# Patient Record
Sex: Female | Born: 1965
Health system: Southern US, Community
[De-identification: ages and names within clinical notes are randomized; demographics above are authoritative.]

## PROBLEM LIST (undated history)

## (undated) DIAGNOSIS — Z9889 Other specified postprocedural states: Secondary | ICD-10-CM

## (undated) DIAGNOSIS — C50919 Malignant neoplasm of unspecified site of unspecified female breast: Secondary | ICD-10-CM

## (undated) DIAGNOSIS — C801 Malignant (primary) neoplasm, unspecified: Secondary | ICD-10-CM

## (undated) DIAGNOSIS — R112 Nausea with vomiting, unspecified: Secondary | ICD-10-CM

## (undated) DIAGNOSIS — Z90722 Acquired absence of ovaries, bilateral: Secondary | ICD-10-CM

## (undated) DIAGNOSIS — D509 Iron deficiency anemia, unspecified: Secondary | ICD-10-CM

## (undated) HISTORY — DX: Acquired absence of ovaries, bilateral: Z90.722

## (undated) HISTORY — DX: Iron deficiency anemia, unspecified: D50.9

## (undated) HISTORY — DX: Malignant neoplasm of unspecified site of unspecified female breast: C50.919

---

## 1999-03-14 ENCOUNTER — Encounter: Payer: Self-pay | Admitting: Obstetrics and Gynecology

## 1999-03-14 ENCOUNTER — Encounter: Admission: RE | Admit: 1999-03-14 | Discharge: 1999-03-14 | Payer: Self-pay | Admitting: Obstetrics and Gynecology

## 1999-05-16 ENCOUNTER — Other Ambulatory Visit: Admission: RE | Admit: 1999-05-16 | Discharge: 1999-05-16 | Payer: Self-pay | Admitting: Obstetrics and Gynecology

## 1999-11-02 ENCOUNTER — Inpatient Hospital Stay (HOSPITAL_COMMUNITY): Admission: AD | Admit: 1999-11-02 | Discharge: 1999-11-02 | Payer: Self-pay | Admitting: Obstetrics and Gynecology

## 1999-11-23 ENCOUNTER — Ambulatory Visit (HOSPITAL_COMMUNITY): Admission: RE | Admit: 1999-11-23 | Discharge: 1999-11-23 | Payer: Self-pay | Admitting: Obstetrics and Gynecology

## 1999-11-23 ENCOUNTER — Encounter: Payer: Self-pay | Admitting: Obstetrics and Gynecology

## 2000-08-07 ENCOUNTER — Other Ambulatory Visit: Admission: RE | Admit: 2000-08-07 | Discharge: 2000-08-07 | Payer: Self-pay | Admitting: Obstetrics and Gynecology

## 2002-02-04 ENCOUNTER — Other Ambulatory Visit: Admission: RE | Admit: 2002-02-04 | Discharge: 2002-02-04 | Payer: Self-pay | Admitting: Obstetrics and Gynecology

## 2002-04-30 HISTORY — PX: DIAGNOSTIC LAPAROSCOPY: SUR761

## 2002-07-24 ENCOUNTER — Ambulatory Visit (HOSPITAL_COMMUNITY): Admission: RE | Admit: 2002-07-24 | Discharge: 2002-07-24 | Payer: Self-pay | Admitting: Obstetrics and Gynecology

## 2002-07-24 ENCOUNTER — Encounter (INDEPENDENT_AMBULATORY_CARE_PROVIDER_SITE_OTHER): Payer: Self-pay

## 2003-05-01 HISTORY — PX: CHOLECYSTECTOMY: SHX55

## 2003-05-21 ENCOUNTER — Other Ambulatory Visit: Admission: RE | Admit: 2003-05-21 | Discharge: 2003-05-21 | Payer: Self-pay | Admitting: Obstetrics and Gynecology

## 2003-08-08 ENCOUNTER — Emergency Department (HOSPITAL_COMMUNITY): Admission: EM | Admit: 2003-08-08 | Discharge: 2003-08-08 | Payer: Self-pay | Admitting: Emergency Medicine

## 2003-11-08 ENCOUNTER — Emergency Department (HOSPITAL_COMMUNITY): Admission: EM | Admit: 2003-11-08 | Discharge: 2003-11-08 | Payer: Self-pay | Admitting: Emergency Medicine

## 2003-12-23 ENCOUNTER — Ambulatory Visit (HOSPITAL_COMMUNITY): Admission: RE | Admit: 2003-12-23 | Discharge: 2003-12-23 | Payer: Self-pay | Admitting: General Surgery

## 2003-12-29 ENCOUNTER — Ambulatory Visit (HOSPITAL_COMMUNITY): Admission: RE | Admit: 2003-12-29 | Discharge: 2003-12-29 | Payer: Self-pay | Admitting: *Deleted

## 2004-02-07 ENCOUNTER — Other Ambulatory Visit: Admission: RE | Admit: 2004-02-07 | Discharge: 2004-02-07 | Payer: Self-pay | Admitting: Obstetrics and Gynecology

## 2004-03-07 ENCOUNTER — Ambulatory Visit (HOSPITAL_COMMUNITY): Admission: RE | Admit: 2004-03-07 | Discharge: 2004-03-07 | Payer: Self-pay | Admitting: Family Medicine

## 2004-03-29 ENCOUNTER — Ambulatory Visit: Payer: Self-pay | Admitting: Internal Medicine

## 2004-03-30 ENCOUNTER — Ambulatory Visit: Payer: Self-pay | Admitting: Internal Medicine

## 2004-04-30 HISTORY — PX: LAPAROSCOPY: SHX197

## 2004-05-18 ENCOUNTER — Ambulatory Visit: Payer: Self-pay | Admitting: Internal Medicine

## 2004-05-21 ENCOUNTER — Emergency Department (HOSPITAL_COMMUNITY): Admission: EM | Admit: 2004-05-21 | Discharge: 2004-05-22 | Payer: Self-pay | Admitting: Emergency Medicine

## 2004-05-24 ENCOUNTER — Ambulatory Visit: Payer: Self-pay | Admitting: Internal Medicine

## 2004-05-24 ENCOUNTER — Ambulatory Visit (HOSPITAL_COMMUNITY): Admission: RE | Admit: 2004-05-24 | Discharge: 2004-05-24 | Payer: Self-pay | Admitting: Internal Medicine

## 2004-05-25 ENCOUNTER — Ambulatory Visit (HOSPITAL_COMMUNITY): Admission: RE | Admit: 2004-05-25 | Discharge: 2004-05-25 | Payer: Self-pay | Admitting: Internal Medicine

## 2004-06-01 ENCOUNTER — Ambulatory Visit (HOSPITAL_COMMUNITY): Admission: RE | Admit: 2004-06-01 | Discharge: 2004-06-01 | Payer: Self-pay | Admitting: Internal Medicine

## 2004-10-19 ENCOUNTER — Ambulatory Visit: Admission: RE | Admit: 2004-10-19 | Discharge: 2004-10-19 | Payer: Self-pay | Admitting: Gynecology

## 2005-04-18 ENCOUNTER — Other Ambulatory Visit: Admission: RE | Admit: 2005-04-18 | Discharge: 2005-04-18 | Payer: Self-pay | Admitting: Obstetrics and Gynecology

## 2006-03-24 ENCOUNTER — Emergency Department (HOSPITAL_COMMUNITY): Admission: EM | Admit: 2006-03-24 | Discharge: 2006-03-24 | Payer: Self-pay | Admitting: Emergency Medicine

## 2007-09-05 ENCOUNTER — Ambulatory Visit (HOSPITAL_COMMUNITY): Payer: Self-pay | Admitting: Oncology

## 2007-11-10 ENCOUNTER — Ambulatory Visit (HOSPITAL_COMMUNITY): Payer: Self-pay | Admitting: Oncology

## 2008-04-30 HISTORY — PX: OTHER SURGICAL HISTORY: SHX169

## 2008-04-30 HISTORY — PX: ABDOMINAL HYSTERECTOMY: SHX81

## 2008-04-30 HISTORY — PX: BILATERAL OOPHORECTOMY: SHX1221

## 2008-09-23 ENCOUNTER — Encounter: Admission: RE | Admit: 2008-09-23 | Discharge: 2008-09-23 | Payer: Self-pay | Admitting: Obstetrics and Gynecology

## 2008-09-28 ENCOUNTER — Encounter: Admission: RE | Admit: 2008-09-28 | Discharge: 2008-09-28 | Payer: Self-pay | Admitting: Obstetrics and Gynecology

## 2008-09-28 ENCOUNTER — Encounter (INDEPENDENT_AMBULATORY_CARE_PROVIDER_SITE_OTHER): Payer: Self-pay | Admitting: Radiology

## 2008-09-28 DIAGNOSIS — C50919 Malignant neoplasm of unspecified site of unspecified female breast: Secondary | ICD-10-CM

## 2008-09-28 HISTORY — DX: Malignant neoplasm of unspecified site of unspecified female breast: C50.919

## 2008-10-05 ENCOUNTER — Encounter: Admission: RE | Admit: 2008-10-05 | Discharge: 2008-10-05 | Payer: Self-pay | Admitting: Obstetrics and Gynecology

## 2008-10-07 ENCOUNTER — Encounter (INDEPENDENT_AMBULATORY_CARE_PROVIDER_SITE_OTHER): Payer: Self-pay | Admitting: Obstetrics and Gynecology

## 2008-10-07 ENCOUNTER — Encounter: Admission: RE | Admit: 2008-10-07 | Discharge: 2008-10-07 | Payer: Self-pay | Admitting: Obstetrics and Gynecology

## 2008-10-07 ENCOUNTER — Encounter (INDEPENDENT_AMBULATORY_CARE_PROVIDER_SITE_OTHER): Payer: Self-pay | Admitting: Diagnostic Radiology

## 2008-10-21 ENCOUNTER — Encounter: Admission: RE | Admit: 2008-10-21 | Discharge: 2008-10-21 | Payer: Self-pay | Admitting: General Surgery

## 2008-10-22 ENCOUNTER — Encounter (INDEPENDENT_AMBULATORY_CARE_PROVIDER_SITE_OTHER): Payer: Self-pay | Admitting: General Surgery

## 2008-10-22 ENCOUNTER — Encounter: Admission: RE | Admit: 2008-10-22 | Discharge: 2008-10-22 | Payer: Self-pay | Admitting: General Surgery

## 2008-10-22 ENCOUNTER — Ambulatory Visit (HOSPITAL_BASED_OUTPATIENT_CLINIC_OR_DEPARTMENT_OTHER): Admission: RE | Admit: 2008-10-22 | Discharge: 2008-10-22 | Payer: Self-pay | Admitting: General Surgery

## 2008-11-25 ENCOUNTER — Ambulatory Visit: Admission: RE | Admit: 2008-11-25 | Discharge: 2009-01-24 | Payer: Self-pay | Admitting: Radiation Oncology

## 2008-12-03 ENCOUNTER — Ambulatory Visit (HOSPITAL_COMMUNITY): Payer: Self-pay | Admitting: Oncology

## 2008-12-03 ENCOUNTER — Encounter (HOSPITAL_COMMUNITY): Admission: RE | Admit: 2008-12-03 | Discharge: 2009-01-02 | Payer: Self-pay | Admitting: Oncology

## 2008-12-14 ENCOUNTER — Encounter (INDEPENDENT_AMBULATORY_CARE_PROVIDER_SITE_OTHER): Payer: Self-pay | Admitting: Obstetrics and Gynecology

## 2008-12-14 ENCOUNTER — Inpatient Hospital Stay (HOSPITAL_COMMUNITY): Admission: RE | Admit: 2008-12-14 | Discharge: 2008-12-16 | Payer: Self-pay | Admitting: Obstetrics and Gynecology

## 2009-02-25 ENCOUNTER — Encounter (HOSPITAL_COMMUNITY): Admission: RE | Admit: 2009-02-25 | Discharge: 2009-03-27 | Payer: Self-pay | Admitting: Oncology

## 2009-02-25 ENCOUNTER — Ambulatory Visit (HOSPITAL_COMMUNITY): Payer: Self-pay | Admitting: Oncology

## 2009-04-07 ENCOUNTER — Ambulatory Visit (HOSPITAL_COMMUNITY): Admission: RE | Admit: 2009-04-07 | Discharge: 2009-04-07 | Payer: Self-pay | Admitting: Family Medicine

## 2009-05-03 ENCOUNTER — Encounter (HOSPITAL_COMMUNITY): Admission: RE | Admit: 2009-05-03 | Discharge: 2009-06-02 | Payer: Self-pay | Admitting: Oncology

## 2009-05-03 ENCOUNTER — Ambulatory Visit (HOSPITAL_COMMUNITY): Payer: Self-pay | Admitting: Oncology

## 2009-06-30 ENCOUNTER — Ambulatory Visit (HOSPITAL_COMMUNITY): Payer: Self-pay | Admitting: Oncology

## 2009-06-30 ENCOUNTER — Encounter (HOSPITAL_COMMUNITY): Admission: RE | Admit: 2009-06-30 | Discharge: 2009-07-30 | Payer: Self-pay | Admitting: Oncology

## 2009-09-07 ENCOUNTER — Ambulatory Visit (HOSPITAL_COMMUNITY): Admission: RE | Admit: 2009-09-07 | Discharge: 2009-09-07 | Payer: Self-pay | Admitting: Oncology

## 2010-01-03 ENCOUNTER — Encounter (HOSPITAL_COMMUNITY): Admission: RE | Admit: 2010-01-03 | Discharge: 2010-01-27 | Payer: Self-pay | Admitting: Oncology

## 2010-01-03 ENCOUNTER — Ambulatory Visit (HOSPITAL_COMMUNITY): Payer: Self-pay | Admitting: Oncology

## 2010-02-13 ENCOUNTER — Emergency Department (HOSPITAL_COMMUNITY): Admission: EM | Admit: 2010-02-13 | Discharge: 2010-02-13 | Payer: Self-pay | Admitting: Emergency Medicine

## 2010-05-21 ENCOUNTER — Encounter: Payer: Self-pay | Admitting: Family Medicine

## 2010-07-05 ENCOUNTER — Ambulatory Visit (HOSPITAL_COMMUNITY): Payer: BC Managed Care – PPO | Admitting: Oncology

## 2010-07-05 ENCOUNTER — Encounter (HOSPITAL_COMMUNITY): Payer: BC Managed Care – PPO | Attending: Oncology

## 2010-07-05 DIAGNOSIS — D509 Iron deficiency anemia, unspecified: Secondary | ICD-10-CM | POA: Insufficient documentation

## 2010-07-05 DIAGNOSIS — Z09 Encounter for follow-up examination after completed treatment for conditions other than malignant neoplasm: Secondary | ICD-10-CM | POA: Insufficient documentation

## 2010-07-05 DIAGNOSIS — R209 Unspecified disturbances of skin sensation: Secondary | ICD-10-CM | POA: Insufficient documentation

## 2010-07-05 DIAGNOSIS — Z853 Personal history of malignant neoplasm of breast: Secondary | ICD-10-CM | POA: Insufficient documentation

## 2010-07-05 DIAGNOSIS — Z923 Personal history of irradiation: Secondary | ICD-10-CM | POA: Insufficient documentation

## 2010-07-05 DIAGNOSIS — C50919 Malignant neoplasm of unspecified site of unspecified female breast: Secondary | ICD-10-CM

## 2010-07-12 LAB — BASIC METABOLIC PANEL
BUN: 12 mg/dL (ref 6–23)
Chloride: 106 mEq/L (ref 96–112)
GFR calc non Af Amer: >60 mL/min (ref >60)
Glucose, Bld: 83 mg/dL (ref 70–99)
Potassium: 3.7 mEq/L (ref 3.5–5.1)
Sodium: 141 mEq/L (ref 135–145)

## 2010-07-12 LAB — DIFFERENTIAL
Basophils Relative: 1 % (ref 0–1)
Eosinophils Relative: 2 % (ref 0–5)
Lymphocytes Relative: 31 % (ref 12–46)
Monocytes Absolute: 0.3 10*3/uL (ref 0.1–1.0)
Monocytes Relative: 7 % (ref 3–12)
Neutro Abs: 2.8 10*3/uL (ref 1.7–7.7)

## 2010-07-12 LAB — CBC
HCT: 33.9 % — ABNORMAL LOW (ref 36.0–46.0)
Hemoglobin: 11.3 g/dL — ABNORMAL LOW (ref 12.0–15.0)
MCHC: 33.4 g/dL (ref 30.0–36.0)
MCV: 86 fL (ref 78.0–100.0)

## 2010-07-12 LAB — POCT CARDIAC MARKERS
CKMB, poc: <1 ng/mL — ABNORMAL LOW (ref 1.0–8.0)
Troponin i, poc: <0.05 ng/mL (ref 0.00–0.09)

## 2010-07-13 LAB — CBC
HCT: 37.3 % (ref 36.0–46.0)
Hemoglobin: 12 g/dL (ref 12.0–15.0)
MCH: 28.2 pg (ref 26.0–34.0)
MCHC: 32.1 g/dL (ref 30.0–36.0)
MCV: 87.7 fL (ref 78.0–100.0)

## 2010-07-16 LAB — CBC
HCT: 33.2 % — ABNORMAL LOW (ref 36.0–46.0)
MCV: 83.4 fL (ref 78.0–100.0)
Platelets: 211 10*3/uL (ref 150–400)
RDW: 16 % — ABNORMAL HIGH (ref 11.5–15.5)

## 2010-07-23 LAB — FERRITIN: Ferritin: 18 ng/mL (ref 10–291)

## 2010-07-23 LAB — CBC
MCHC: 33.9 g/dL (ref 30.0–36.0)
MCV: 86 fL (ref 78.0–100.0)
RDW: 13.3 % (ref 11.5–15.5)

## 2010-08-03 LAB — CBC
MCV: 80.8 fL (ref 78.0–100.0)
Platelets: 221 10*3/uL (ref 150–400)
RDW: 16.2 % — ABNORMAL HIGH (ref 11.5–15.5)
WBC: 5 10*3/uL (ref 4.0–10.5)

## 2010-08-03 LAB — FERRITIN: Ferritin: 10 ng/mL (ref 10–291)

## 2010-08-05 LAB — CBC
HCT: 23.1 % — ABNORMAL LOW (ref 36.0–46.0)
HCT: 23.9 % — ABNORMAL LOW (ref 36.0–46.0)
HCT: 31.3 % — ABNORMAL LOW (ref 36.0–46.0)
HCT: 28.9 % — ABNORMAL LOW (ref 36.0–46.0)
Hemoglobin: 7.7 g/dL — CL (ref 12.0–15.0)
Hemoglobin: 8 g/dL — ABNORMAL LOW (ref 12.0–15.0)
Hemoglobin: 9.6 g/dL — ABNORMAL LOW (ref 12.0–15.0)
MCHC: 33.1 g/dL (ref 30.0–36.0)
MCV: 82.4 fL (ref 78.0–100.0)
MCV: 82.4 fL (ref 78.0–100.0)
MCV: 82.6 fL (ref 78.0–100.0)
Platelets: 207 10*3/uL (ref 150–400)
Platelets: 210 10*3/uL (ref 150–400)
Platelets: 280 10*3/uL (ref 150–400)
Platelets: 276 10*3/uL (ref 150–400)
RBC: 2.9 MIL/uL — ABNORMAL LOW (ref 3.87–5.11)
RDW: 15.7 % — ABNORMAL HIGH (ref 11.5–15.5)
RDW: 15.9 % — ABNORMAL HIGH (ref 11.5–15.5)
RDW: 15.5 % (ref 11.5–15.5)
WBC: 10.9 10*3/uL — ABNORMAL HIGH (ref 4.0–10.5)
WBC: 6.1 10*3/uL (ref 4.0–10.5)

## 2010-08-05 LAB — BASIC METABOLIC PANEL
BUN: 4 mg/dL — ABNORMAL LOW (ref 6–23)
Chloride: 108 mEq/L (ref 96–112)
GFR calc Af Amer: >60 mL/min (ref >60)
GFR calc non Af Amer: >60 mL/min (ref >60)
Potassium: 3.9 mEq/L (ref 3.5–5.1)
Sodium: 137 mEq/L (ref 135–145)

## 2010-08-05 LAB — URINALYSIS, ROUTINE W REFLEX MICROSCOPIC
Hgb urine dipstick: NEGATIVE
Ketones, ur: NEGATIVE mg/dL
Protein, ur: NEGATIVE mg/dL
Urobilinogen, UA: 0.2 mg/dL (ref 0.0–1.0)

## 2010-08-05 LAB — COMPREHENSIVE METABOLIC PANEL
AST: 21 U/L (ref 0–37)
Albumin: 4 g/dL (ref 3.5–5.2)
BUN: 9 mg/dL (ref 6–23)
Calcium: 8.9 mg/dL (ref 8.4–10.5)
Chloride: 100 mEq/L (ref 96–112)
Creatinine, Ser: 0.6 mg/dL (ref 0.4–1.2)
GFR calc Af Amer: >60 mL/min (ref >60)
Total Bilirubin: 0.5 mg/dL (ref 0.3–1.2)
Total Protein: 7.7 g/dL (ref 6.0–8.3)

## 2010-08-05 LAB — APTT: aPTT: 28 seconds (ref 24–37)

## 2010-08-05 LAB — PROTIME-INR: INR: 1 (ref 0.00–1.49)

## 2010-08-07 LAB — CBC
MCHC: 32.5 g/dL (ref 30.0–36.0)
MCV: 81.3 fL (ref 78.0–100.0)
Platelets: 256 10*3/uL (ref 150–400)
WBC: 5.2 10*3/uL (ref 4.0–10.5)

## 2010-08-07 LAB — COMPREHENSIVE METABOLIC PANEL
ALT: 14 U/L (ref 0–35)
AST: 20 U/L (ref 0–37)
Albumin: 3.5 g/dL (ref 3.5–5.2)
Calcium: 8.9 mg/dL (ref 8.4–10.5)
Creatinine, Ser: 0.72 mg/dL (ref 0.4–1.2)
GFR calc Af Amer: >60 mL/min (ref >60)
Sodium: 141 mEq/L (ref 135–145)

## 2010-08-07 LAB — DIFFERENTIAL
Eosinophils Relative: 2 % (ref 0–5)
Lymphocytes Relative: 28 % (ref 12–46)
Lymphs Abs: 1.4 10*3/uL (ref 0.7–4.0)
Monocytes Absolute: 0.3 10*3/uL (ref 0.1–1.0)
Monocytes Relative: 5 % (ref 3–12)

## 2010-08-28 ENCOUNTER — Encounter (INDEPENDENT_AMBULATORY_CARE_PROVIDER_SITE_OTHER): Payer: Self-pay | Admitting: General Surgery

## 2010-09-05 ENCOUNTER — Other Ambulatory Visit (HOSPITAL_COMMUNITY): Payer: Self-pay | Admitting: Oncology

## 2010-09-05 ENCOUNTER — Encounter (HOSPITAL_COMMUNITY): Payer: BC Managed Care – PPO | Attending: Oncology | Admitting: Oncology

## 2010-09-05 DIAGNOSIS — Z853 Personal history of malignant neoplasm of breast: Secondary | ICD-10-CM | POA: Insufficient documentation

## 2010-09-05 DIAGNOSIS — C50919 Malignant neoplasm of unspecified site of unspecified female breast: Secondary | ICD-10-CM

## 2010-09-05 DIAGNOSIS — R209 Unspecified disturbances of skin sensation: Secondary | ICD-10-CM | POA: Insufficient documentation

## 2010-09-05 DIAGNOSIS — Z923 Personal history of irradiation: Secondary | ICD-10-CM | POA: Insufficient documentation

## 2010-09-05 DIAGNOSIS — D509 Iron deficiency anemia, unspecified: Secondary | ICD-10-CM | POA: Insufficient documentation

## 2010-09-05 DIAGNOSIS — Z09 Encounter for follow-up examination after completed treatment for conditions other than malignant neoplasm: Secondary | ICD-10-CM | POA: Insufficient documentation

## 2010-09-05 DIAGNOSIS — R1907 Generalized intra-abdominal and pelvic swelling, mass and lump: Secondary | ICD-10-CM

## 2010-09-05 LAB — BASIC METABOLIC PANEL
BUN: 13 mg/dL (ref 6–23)
CO2: 31 mEq/L (ref 19–32)
Chloride: 102 mEq/L (ref 96–112)
Creatinine, Ser: 0.61 mg/dL (ref 0.4–1.2)
Glucose, Bld: 86 mg/dL (ref 70–99)

## 2010-09-07 ENCOUNTER — Ambulatory Visit (HOSPITAL_COMMUNITY)
Admission: RE | Admit: 2010-09-07 | Discharge: 2010-09-07 | Disposition: A | Discharge status: Home / Self Care | Payer: BC Managed Care – PPO | Source: Ambulatory Visit | Attending: Oncology | Admitting: Oncology

## 2010-09-07 ENCOUNTER — Encounter (HOSPITAL_COMMUNITY): Payer: Self-pay

## 2010-09-07 ENCOUNTER — Other Ambulatory Visit (HOSPITAL_COMMUNITY): Payer: Self-pay | Admitting: Oncology

## 2010-09-07 DIAGNOSIS — R1012 Left upper quadrant pain: Secondary | ICD-10-CM | POA: Insufficient documentation

## 2010-09-07 DIAGNOSIS — C50919 Malignant neoplasm of unspecified site of unspecified female breast: Secondary | ICD-10-CM | POA: Insufficient documentation

## 2010-09-07 DIAGNOSIS — R1907 Generalized intra-abdominal and pelvic swelling, mass and lump: Secondary | ICD-10-CM

## 2010-09-07 DIAGNOSIS — K7689 Other specified diseases of liver: Secondary | ICD-10-CM | POA: Insufficient documentation

## 2010-09-07 HISTORY — DX: Malignant (primary) neoplasm, unspecified: C80.1

## 2010-09-07 MED ORDER — IOHEXOL 300 MG/ML  SOLN
100.0000 mL | Freq: Once | INTRAMUSCULAR | Status: AC | PRN
  Administered 2010-09-07: 100 mL via INTRAVENOUS

## 2010-09-12 ENCOUNTER — Other Ambulatory Visit: Payer: Self-pay | Admitting: General Surgery

## 2010-09-12 DIAGNOSIS — Z9889 Other specified postprocedural states: Secondary | ICD-10-CM

## 2010-09-12 NOTE — Op Note (Signed)
NAME:  Veronica Dudley, Veronica Dudley               ACCOUNT NO.:  [id number]   MEDICAL RECORD NO.:  [id number]          PATIENT TYPE:  AMB   LOCATION:  DSC                          FACILITY:  [facility]   PHYSICIAN:  [name]. [name], M.D. DATE OF BIRTH:  [birth year]   DATE OF PROCEDURE:  10/22/2008  DATE OF DISCHARGE:  10/22/2008                               OPERATIVE REPORT   PREOPERATIVE DIAGNOSIS:  Left breast cancer.   POSTOPERATIVE DIAGNOSIS:  Left breast cancer.   PROCEDURE:  Left breast wire localized lumpectomy and sentinel node  biopsy x2 with injection of blue dye.   SURGEON:  [name]. [name], MD   ANESTHESIA:  General via LMA.   PROCEDURE:  After informed consent was obtained, the patient was brought  to the operating room, placed in supine position on the operating table.  After adequate induction of general anesthesia, the patient's left chest  and axilla were prepped with Betadine and draped in usual sterile  manner.  Earlier in the day, the patient had undergone injection of 1  mCi of technetium sulfur colloid in the subareolar position.  The  patient also underwent a wire localization earlier in the day.  The  patient had 2 wires entering the left breast in the upper inner quadrant  medially and heading laterally.  At this point, 2 mL of methylene blue  and 3 mL of injectable saline were also injected in the subareolar  position.  The breast was massaged for several minutes.  Using the  NeoProbe, a hot spot in the left axilla was identified.  A small  transverse incision was made overlying the hot spot with a 15 blade  knife.  This incision was carried down through the skin and subcutaneous  tissue sharply with the electrocautery until the axilla was entered.  A  Weitlaner retractor was deployed using the NeoProbe to direct the  dissection.  The hot lymph nodes were identified using a combination of  blunt hemostat dissection and some sharp dissection with the  electrocautery.   Once the lymph nodes were identified, the lymphatics  were clamped with hemostats, divided, and ligated with 3-0 Vicryl ties.  There were 2 sentinel nodes both for hot and blue.  Ex vivo counts on  the first were about 1200, ex vivo counts on the second were about 300.  Both were sent as sentinel nodes.  Touch preps on these were negative.  No other hot lymph nodes, palpable lymph nodes, or blue lymph nodes were  identified.  The deep layer of the wound was then closed with  interrupted 3-0 Vicryl stitches and the skin was closed with a running 4-  0 Monocryl subcuticular stitch.  Attention was then turned to the left  breast.  A curvilinear transverse incision was made overlying the path  of the wires in the upper inner quadrant of the left breast with a 15  blade knife.  This incision was carried down through the skin and  subcutaneous tissue sharply with the electrocautery.  Once into the  breast tissue, the path of the wires could be  palpated.  A circular  portion of breast tissue was excised sharply from this quadrant of the  breast.  Superficially, the breast tissue was separated at the junction  between subcutaneous fat and breast tissue and then this dissection was  carried deep down to the chest wall and the specimen was taken with the  pectoralis fascia.  Once it was completely removed, it was oriented with  the pain kit according to the assigned colors.  Specimen radiograph was  obtained that showed the clip and wires and calcifications to be well  within the specimen.  It was then sent to pathology for further  evaluation.  Hemostasis was achieved using the Bovie electrocautery.  The wound was infiltrated with 0.25% Marcaine.  The deep layer of the  wound was closed with interrupted 3-0 Vicryl stitches and  the skin was closed with the running 4-0 Monocryl subcuticular stitch.  Dermabond dressings were applied.  The patient tolerated the procedure  well.  At the end of the  case, all needle, sponge, and instrument counts  were correct.  The patient was then awakened and taken to recovery room  in stable condition.      [name]. [name], M.D.  Electronically Signed     PST/MEDQ  D:  10/25/2008  T:  10/25/2008  Job:  [id number]

## 2010-09-12 NOTE — Op Note (Signed)
NAME:  Veronica Dudley, Veronica Dudley               ACCOUNT NO.:  [id number]   MEDICAL RECORD NO.:  [id number]          PATIENT TYPE:  INP   LOCATION:  9306                          FACILITY:  [facility]   PHYSICIAN:  [name], M.D.   DATE OF BIRTH:  [birth year]   DATE OF PROCEDURE:  DATE OF DISCHARGE:                               OPERATIVE REPORT   PREOPERATIVE DIAGNOSES:  1. Complex left adnexal mass.  2. Thickened endometrium.  3. History of endometriosis.  4. History of uterine fibroids.  5. Left breast carcinoma in situ, estrogen receptor positive.   POSTOPERATIVE DIAGNOSES:  1. Complex left adnexal mass.  2. Thickened endometrium.  3. Endometriosis.  4. History of uterine fibroids.  5. Left breast carcinoma in situ, estrogen receptor positive.   PROCEDURE:  A diagnostic laparoscopy, conversion to laparotomy with  supracervical hysterectomy, left salpingo-oophorectomy, lysis of  adhesions.   SURGEON:  [name], MD   ASSISTANT:  Miguel Aschoff, MD   ANESTHESIA:  General endotracheal.   IV FLUIDS:  2800 mL Ringer's lactate.   ESTIMATED BLOOD LOSS:  300 mL   URINE OUTPUT:  200 mL   COMPLICATIONS:  None.   INDICATIONS FOR PROCEDURE:  The patient is a 45 year old gravida 2, para  2 African American female who has a long history of endometriosis,  menorrhagia and anemia and a history of known uterine fibroids, who has  been followed for dysmenorrhea and bleeding problems.  The patient has  had an ultrasound documenting a complex left adnexal mass suspicious for  an endometrioma and she has not been compliant with followup.  The  patient has intermittently come to the office requesting treatment of  her pain and has been treated with Depo-Provera and oral contraceptive  pills in the past.  When the patient recently represented for care, an  ultrasound was performed which documented an endometrial stripe of 21.4  mm with a thickened area consistent with a possible fibroid or  polyp.  At that time, the complex left adnexal mass measuring 3.29 cm was again  confirmed.  The cyst had no increased blood flow.  The patient did not  have a right tube and ovary as she has previously undergone a right  salpingo-oophorectomy.  During the patient's preoperative evaluation for  removal of the left ovarian cyst and a hysteroscopic removal of the  possible fibroid, the patient underwent her routine mammogram which  ultimately led to the very recent diagnosis of a left breast ductal  carcinoma in situ which was estrogen-receptor positive and progesterone-  receptor negative.  The patient has subsequently undergone a left  lumpectomy and is in the process of doing her course of radiation  therapy.  She has been advised by her oncologist to proceed with removal  of the remaining ovary.  After a long discussion with the patient, a  plan is now made to proceed with an attempted laparoscopic supracervical  hysterectomy with left salpingo-oophorectomy.  The patient understands  that there is the possibility of need for a laparotomy based on adhesive  disease.  Risks, benefits, and alternatives have  been extensively  discovered with the patient who wishes to proceed now.   FINDINGS:  Diagnostic laparoscopy revealed complete obliteration of the  posterior cul-de-sac.  It was not possible to visualize either adnexal  region.  There were some adhesions along the anterior lower uterine  segment consistent with a prior cesarean section.   At the time of laparotomy, complete obliteration of the posterior cul-de-  sac was confirmed.  There was a loop of bowel which was densely adherent  to the cervix and lower uterine segment on the right-hand side.  There  were adhesions in the anterior cul-de-sac as noted above.  The left  ovary was densely adherent to the posterior cul-de-sac and it was noted  to have chocolate cystic fluid consistent with an endometrioma.  The  right tube and ovary  were absent.  The uterus itself appeared to be  fairly unremarkable.   SPECIMENS:  The left tube and ovary were sent to pathology along with  the uterine fundus.   DESCRIPTION OF PROCEDURE:  The patient was reidentified in the  preoperative holding area.  She did receive cefotetan 1 g IV for  antibiotic prophylaxis.  In the operating room, general endotracheal  anesthesia was induced and the patient was then placed in the dorsal  lithotomy position.  The abdomen, vagina, and perineum were then  sterilely prepped.  A Foley catheter was placed inside the bladder.  A  speculum was placed inside the vagina and a single-tooth tenaculum was  placed on the anterior cervical lip.  This was replaced with a Hulka  tenaculum.  The patient was sterilely draped.   The procedure began by creating a 1-cm infraumbilical incision with a  scalpel.  A Veress needle was then introduced into the peritoneal cavity  without difficulty.  A saline drop test was performed in the saline  flowed freely.  A CO2 pneumoperitoneum was achieved after the  intraperitoneal pressure was noted to be low at the level of 4-5.  The  CO2 pneumoperitoneum was achieved and then the 10-mm trocar was inserted  into the peritoneal cavity without difficulty.  The laparoscope did  confirm proper placement.  The patient was placed in the Trendelenburg  position.  The adhesions were identified and a decision was made to  proceed with laparotomy.  The laparoscope and the umbilical trocar were  removed.   A Pfannenstiel incision was created sharply with a scalpel.  This  incision was carried down to the fascia using a scalpel.  This was a  fairly bloodless dissection due to the extensive adhesions of the  anterior abdominal wall.  The fascia however was identified without  difficulty and was incised in the midline.  The incision was extended  bilaterally with the Mayo scissors.  Rectus muscles were then dissected  off of the fascia  superiorly and inferiorly using a Mayo scissors and  monopolar cautery for hemostasis.  The rectus muscles were then divided  in the midline.  The parietal peritoneum was elevated with 2 hemostat  clamps and was entered sharply.  The peritoneal incision was extended  cranially and caudally.   A self-retaining retractor was then placed inside the peritoneal cavity  and the bowel was packed into the upper abdomen using a moistened lap  pad.  There were some adhesions adjacent to the bilateral adnexal  regions which were consistent with bowel adhesions.  These were lysed  sharply with the Metzenbaum scissors.  Adhesions in the posterior cul-de-  sac  were next addressed again sharply with the Metzenbaum scissors to  allow access to the left fallopian tube and ovary.  These bowel  adhesions were lysed without difficulty.  The dense adhesion of the loop  of bowel to the right posterior lower uterine segment and cervix were  next addressed.  The dissection was performed in such a fashion that  uterine serosa and a small amount of myometrium was left attached to the  bowel in order to gain safe access to the cervix in order to perform a  supracervical hysterectomy.   The adnexal structures on the left and the round ligament on the right  were clamped with Kelly clamps.  Each of the round ligaments were suture  ligated with transfixing sutures of 0 Vicryl and the round ligaments  were then divided with monopolar cautery and the broad ligaments opened  with a combination of monopolar cautery and a Metzenbaum scissor.  The  vesicouterine fold was entered sharply in order to dissect the bladder  off the lower uterine segment.  The left pelvic sidewall was opened in  order to identify the ureter.  The left infundibulopelvic ligament was  isolated and a window was created through the posterior leaf of the  broad ligament in order to doubly clamp, sharply divide, and then suture  ligate with 0 Vicryl  after a free tie of 0 Vicryl had been placed.  Hemostasis was good.  Access was then obtained to the left uterine  artery which was clamped, sharply divided, and suture ligated with 0  Vicryl.  The descending branches of the uterine artery on the same side  were similarly clamped, sharply divided, and suture ligated this time  with a transfixing suture of 0 Vicryl.   With careful dissection of the right posterior lower uterine segment,  the bowel was able to drop away posteriorly so that the right uterine  artery could then be clamped, sharply divided, and suture ligated with 0  Vicryl.  This was performed one additional time to get vessels which  were more anterior.  The monopolar cautery was then used to excise the  uterine fundus from the cervix.  The bowel was noted to be away from the  region of the cervix at this time.  The specimen was then sent to  Pathology.   The endocervical canal was cauterized with monopolar cautery.  The  pelvis was then irrigated and suctioned.  A Surgicel was placed in the  posterior cul-de-sac where there was some slight oozing from the region  where the endometrioma had been sitting.  The cul-de-sac was then  reperitonealized with a suture of 2-0 Vicryl.  Hemostasis was noted to  be good at this time and the abdomen was therefore closed.   The lap pads were removed from the upper abdomen and a self-retaining  retractor was taken out.  The parietal peritoneum was closed with a  running suture of 3-0 Vicryl.  The rectus muscles were reapproximated  with interrupted sutures of 0 Vicryl.  The fascia was closed with a  running suture of 0 Vicryl.  The subcutaneous layer was undermined with  monopolar cautery so there would be no tension on closure of the skin.  Hemostasis was assured with monopolar cautery before the skin was closed  with a subcuticular suture of 4-0 Vicryl.  The umbilical incision was  similarly closed with inverted subcuticular sutures  of 4-0 Vicryl.  Steri-Strips and Benzoin were placed over the incisions, which were  then  covered by sterile bandages.   This concluded the patient's procedure.  There were no complications.  All needle, instrument, sponge counts were correct.      [name], M.D.  Electronically Signed     BES/MEDQ  D:  12/14/2008  T:  12/14/2008  Job:  [id number]

## 2010-09-15 ENCOUNTER — Ambulatory Visit
Admission: RE | Admit: 2010-09-15 | Discharge: 2010-09-15 | Disposition: A | Discharge status: Home / Self Care | Payer: BC Managed Care – PPO | Source: Ambulatory Visit | Attending: General Surgery | Admitting: General Surgery

## 2010-09-15 ENCOUNTER — Other Ambulatory Visit: Payer: Self-pay | Admitting: General Surgery

## 2010-09-15 DIAGNOSIS — Z9889 Other specified postprocedural states: Secondary | ICD-10-CM

## 2010-09-15 NOTE — H&P (Signed)
NAME:  Veronica Dudley, Veronica Dudley                         ACCOUNT NO.:  [id number]   MEDICAL RECORD NO.:  [id number]                   PATIENT TYPE:  AMB   LOCATION:  DAY                                  FACILITY:  [facility]   PHYSICIAN:  Dalia Heading, M.D.               DATE OF BIRTH:  Aug 26, 1965   DATE OF ADMISSION:  12/28/2003  DATE OF DISCHARGE:                                HISTORY & PHYSICAL   CHIEF COMPLAINT:  Chronic cholecystitis.   HISTORY OF PRESENT ILLNESS:  The patient is a 45 year old, African-American  female who is referred for evaluation and treatment of right upper quadrant  abdominal pain.  She has been having intermittent episodes of right upper  quadrant abdominal pain with radiation to the right flank, nausea and  bloating for several weeks.  She does have fatty food intolerance.  No  fevers, jaundice or chills had been noted.  Symptoms had been worsening.  The symptoms are postprandial in nature.  Ultrasound of the gallbladder in  June was negative for cholelithiasis.   PAST MEDICAL HISTORY:  Unremarkable.   PAST SURGICAL HISTORY:  C-section.   CURRENT MEDICATIONS:  Aciphex.   ALLERGIES:  No known drug allergies.   REVIEW OF SYSTEMS:  Noncontributory.   PHYSICAL EXAMINATION:  GENERAL:  The patient is a well-developed, well-  nourished, African-American female in no acute distress.  VITAL SIGNS:  She is afebrile with vital signs stable.  HEENT:  Reveals no scleral icterus.  LUNGS:  Clear to auscultation with equal breath sounds bilaterally.  HEART:  Regular rate and rhythm without S3, S4 or murmur.  ABDOMEN:  Soft with slight tenderness down the right upper quadrant to  palpation.  No hepatosplenomegaly, masses or hernias identified.   LABORATORY DATA AND X-RAY FINDINGS:  Hepatobiliary scan reveals chronic  cholecystitis with a low gallbladder ejection fraction, reproducible  symptoms with a fatty meal.   IMPRESSION:  Chronic cholecystitis.   PLAN:  The patient  is planned for laparoscopic cholecystectomy on December 29, 2003.  The risks and benefits of the procedure including bleeding,  infection, hepatobiliary injury and the possibility of an procedure were  fully explained to the patient gaining informed consent.     ___________________________________________                                         Dalia Heading, M.D.   MAJ/MEDQ  D:  12/28/2003  T:  12/28/2003  Job:  [id number]   cc:   [name], M.D.  [address] [place]. [place]  [place]  [place] [address]  Fax: [phone]

## 2010-09-15 NOTE — Op Note (Signed)
NAME:  Veronica Dudley, Veronica Dudley                         ACCOUNT NO.:  [id number]   MEDICAL RECORD NO.:  [id number]                   PATIENT TYPE:  AMB   LOCATION:  DAY                                  FACILITY:  [facility]   PHYSICIAN:  Dalia Heading, M.D.               DATE OF BIRTH:  [birth year]   DATE OF PROCEDURE:  12/29/2003  DATE OF DISCHARGE:                                 OPERATIVE REPORT   PREOPERATIVE DIAGNOSIS:  Chronic cholecystitis.   POSTOPERATIVE DIAGNOSIS:  Chronic cholecystitis.   PROCEDURE:  Laparoscopic cholecystectomy.   SURGEON:  Dalia Heading, M.D.   ASSISTANT:  Bernerd Limbo. Leona Carry, M.D.   ANESTHESIA:  General endotracheal.   INDICATIONS FOR PROCEDURE:  The patient is a 45 year old black female who  presents with chronic cholecystitis.  The risks and benefits of the  procedure, including bleeding, infection, hepatobiliary injury, and the  possibility of an open procedure were fully explained to the patient who  gave informed consent.   DESCRIPTION OF PROCEDURE:  The patient was placed in the supine position.  After induction of general endotracheal anesthesia, the abdomen was prepped  and draped using the usual sterile technique with Betadine.  Surgical site  confirmation was performed.   An infraumbilical incision was made down to the fascia.  A Veress needle was  introduced into the abdominal cavity, and confirmation of placement was done  using the saline drop test.  The abdomen was then insufflated to 16 mmHg  pressure.  An 11-mm trocar was introduced into the abdominal cavity under  direct visualization without difficulty.  The patient was placed in reverse  Trendelenburg position, and an additional 11-mm trocar was placed in the  epigastric region and 5-mm trocars were placed in the right upper quadrant  and right flank regions.  The liver was inspected and noted to be within  normal limits.  The gallbladder was retracted superiorly and laterally.  The  dissection was begun around the infundibulum of the gallbladder.  The cystic  duct was first identified.  Its juncture to the infundibulum was fully  identified.  Endoclips were placed proximally and distally on the cystic  duct, and the cystic duct was divided.  This was likewise done on the cystic  artery.  The gallbladder was then freed away from the gallbladder fossa  using Bovie electrocautery.  The gallbladder was delivered through the  epigastric trocar site using an EndoCatch bag.  The gallbladder fossa was  inspected, and no abnormal bleeding or bile leakage was noted.  Surgicel was  placed in the gallbladder fossa.  All fluid and air were then evacuated from  the abdominal cavity prior to removal of the trocars.   All wounds were irrigated with normal saline.  All wounds were injected with  0.5% Sensorcaine.  The infraumbilical fascia was reapproximated using an 0  Vicryl interrupted suture.  All skin incisions were closed using  staples.  Betadine ointment and dry sterile dressings were applied.   All tape and needle counts were correct at the end of the procedure.  The  patient was extubated in the operating room and went back to the recovery  room awake and in stable condition.   COMPLICATIONS:  None.   SPECIMENS:  Gallbladder.   ESTIMATED BLOOD LOSS:  Minimal.      ___________________________________________                                            Dalia Heading, M.D.   MAJ/MEDQ  D:  12/29/2003  T:  12/29/2003  Job:  [id number]   cc:   [name], M.D.  [address] [place]. [place]  [place]  [place] [address]  Fax: [phone]

## 2010-09-15 NOTE — Op Note (Signed)
NAME:  Veronica Dudley, Veronica Dudley               ACCOUNT NO.:  [id number]   MEDICAL RECORD NO.:  [id number]          PATIENT TYPE:  OBV   LOCATION:  9399                          FACILITY:  [facility]   PHYSICIAN:  [name], M.D.   DATE OF BIRTH:  07-May-1965   DATE OF PROCEDURE:  10/19/2004  DATE OF DISCHARGE:                                 OPERATIVE REPORT   PREOPERATIVE DIAGNOSES:  1.  Right lower quadrant pain.  2.  History of endometriosis.   POSTOPERATIVE DIAGNOSES:  1.  Right lower quadrant pain.  2.  History of endometriosis.  3.  Pelvic and abdominal adhesions.   PROCEDURE:  Open laparoscopy with lysis of adhesions.   SURGEON:  Conley Simmonds, M.D.   ASSISTANT:  Miguel Aschoff, M.D.   ANESTHESIA:  General endotracheal, local with 0.25% Marcaine.   INTRAVENOUS FLUIDS:  900 cc Ringer's lactate.   ESTIMATED BLOOD LOSS:  Minimal.   URINE OUTPUT:  100 cubic centimeters.   COMPLICATIONS:  None.   INDICATIONS FOR PROCEDURE:  The patient is a 45 year old, gravida 2, para 2,  African-American female, status post laparotomy with right salpingo-  oophorectomy for endometriosis, status post primary cesarean section for  placenta previa, status post incisional exploration and excision of  endometriosis, and recently status post laparoscopic cholecystectomy, who  presented reporting constant right lower quadrant pain. An ultrasound on Aug 30, 2004, documented a normal uterus with an endometrial thickness of 1.65  cm. The right tube and ovary were noted to be absent, and the left ovary  demonstrated calcifications. The patient had continued pain despite taking  Seasonale birth control pills. The patient had a negative gonorrhea and  chlamydia culture. The patient at this time wished for conservative  treatment of her pain, and she agreed to proceed with an open laparoscopy  with possible lysis of adhesions and treatment of endometriosis after risks,  benefits, and alternatives were discussed  with her.   FINDINGS:  At the time of laparoscopy, there were abdominal adhesions noted  in the right lower quadrant between the anterior abdominal wall and the  ascending colon. There were also significant adhesions in the posterior cul-  de-sac. The left tube and ovary were densely adherent to the left pelvic  sidewall. The left tube and ovary were mobilized 50%. There also were dense  adhesions between the rectum and the posterior lower uterine segment which  obliterated the cul-de-sac. There were no obvious lesions of endometriosis.  The uterus itself documented two 0.5 cm subserosal fibroids. There otherwise  were no lesions appreciated. The right tube and ovary were noted to be  absent. There were otherwise no lesions of endometriosis or other adhesive  disease in the abdomen nor the pelvis. The liver was unremarkable as was the  stomach organ.   SPECIMEN:  None.   PROCEDURE:  The patient was reidentified in the preoperative hold area. She  was taken down to the operating room after receiving Ancef 1 g intravenously  for antibiotic prophylaxis. General endotracheal anesthesia was induced, and  she was then placed in the dorsal lithotomy position.  The abdomen and vagina  were sterilely prepped and draped. A Foley catheter was placed inside the  bladder. A speculum was placed inside the vagina and a single-tooth  tenaculum was placed on the anterior cervical lip and then replaced with a  Hulka tenaculum.   The procedure began by creating an infraumbilical incision along the line of  the patient's previous incision. The incision was carried down to the rectus  fascia using a combination of sharp and blunt dissection. The fascia was  then grasped with two Kocher clamps and was incised vertically with the Mayo  scissors. The parietoperitoneum was identified and then elevated with a  hemostat clamp and was entered sharply.   The Hassan cannula was then placed inside the abdominal  cavity and the  laparoscope confirmed proper placement. A pneumoperitoneum was achieved and  the patient was then placed in the Trendelenburg position. A 5 mm incision  was then created in the right and left lower quadrants and 5 mm trocars were  placed under visualization of the laparoscope.   Lysis of adhesions of the right lower quadrant abdominal adhesions was  performed first using sharp dissection with the scissors. Hemostasis was  good.   Attention was then turned to the pelvis where sharp and blunt dissection  were used to mobilize the ovary. During the process of the dissection, a  corpus luteum cyst was encountered.   Adhesions between the lower uterine segment and the rectum were then  partially lysed. These were noted to be quite thick and extensive in nature  and could not be completely excised laparoscopically.   The pelvis was irrigated and suctioned. Intercede was placed around the left  tube and ovary and along the posterior lower uterine segment between the  uterus and the rectum.   The 5 mm trocars were removed under direct visualization of the laparoscope.  The pneumoperitoneum was released and the laparoscope and the 10 mm trocar  were removed, as well.   The umbilical incision was closed along the fascia with a figure-of-eight  suture of 0 Vicryl. The skin incisions were closed with subcuticular sutures  of 3-0 plain. Dermabond was then placed over the incisions after the patient  was cleansed of Betadine.   All of the vaginal instruments and the Foley catheter were removed. The  patient was taken out of dorsolithotomy position, awakened, and extubated.  She was escorted to the recovery room in stable and awake condition. There  were no complications to the procedure. All needle, instrument, and sponge  counts were correct.       BES/MEDQ  D:  10/19/2004  T:  10/19/2004  Job:  [id number]

## 2010-09-15 NOTE — Op Note (Signed)
NAME:  Veronica Dudley, Veronica Dudley               ACCOUNT NO.:  [id number]   MEDICAL RECORD NO.:  [id number]          PATIENT TYPE:  AMB   LOCATION:  DAY                           FACILITY:  [facility]   PHYSICIAN:  Lionel December, M.D.    DATE OF BIRTH:  Jan 28, 1966   DATE OF PROCEDURE:  05/24/2004  DATE OF DISCHARGE:                                 OPERATIVE REPORT   PROCEDURE:  Esophagogastroduodenoscopy.   INDICATION:  Anastazja is a 45 year old Afro-American female who has not felt  well she had a laparoscopic cholecystectomy in August 2005.  She has  epigastric pain, fullness, anorexia.  She has been treated with a PPI and  antispasmodic and has not felt better.  She is been documented to have iron-  deficiency anemia, which is felt to be due to uterine blood losses.  She has  had normal LFTs.  She was in the emergency room over the weekend with pain  in the right lower quadrant.  CT showed enlarged left ovary and a lot of  stool in the colon.  She already has had her appendix removed.   She is undergoing diagnostic EGD.  Procedure risks were reviewed with the  patient, informed consent was obtained.   PREMEDICATION:  Cetacaine spray for pharyngeal topical anesthesia, Demerol  50 mg IV, Versed 10 mg IV in divided dose.   FINDINGS:  Procedure performed in endoscopy suite.  The patient's vital  signs and O2 saturation were monitored during procedure and remained stable.  The patient was placed in the left lateral position and Olympus video scope  was passed via oropharynx without any difficulty into esophagus.   Esophagus:  Mucosa of the esophagus was normal throughout.  Squamocolumnar  junction was located at 40 cm from the incisors.   Stomach:  It was empty and distended very well with insufflation.  Folds of  the proximal stomach were normal.  Examination of the mucosa was normal.  There was a small diverticulum along the posterior wall at the antrum.  Pyloric channel was patent.  Angularis,  fundus and cardia examined by  retroflexing the scope and were normal.   Duodenum:  Examination of bulb and postbulbar duodenum was normal.  Endoscope was withdrawn. The patient tolerated the procedure well.   FINAL DIAGNOSIS:  Normal esophagogastroduodenoscopy.  Incidental finding of  small diverticulum at gastric antrum.   RECOMMENDATIONS:  1.  Trial with Carafate liquid 1 g a.c. and q.h.s., and will proceed with      small-bowel follow-through.  2.  The patient advised to take ferrous sulfate 325 mg once or twice daily      with meals.  3.  If her small bowel study is also normal, I would consider empiric      therapy with tricyclic antidepressant or selective agent, Lexapro      __________.      NR/MEDQ  D:  05/24/2004  T:  05/24/2004  Job:  [id number]   cc:   [name], M.D.  Fax: [phone]

## 2010-09-15 NOTE — H&P (Signed)
NAME:  FATEN, FRIESON               ACCOUNT NO.:  [id number]   MEDICAL RECORD NO.:  [id number]          PATIENT TYPE:  AMB   LOCATION:  DAY                           FACILITY:  [facility]   PHYSICIAN:  Lionel December, M.D.    DATE OF BIRTH:  May 02, 1965   DATE OF ADMISSION:  DATE OF DISCHARGE:  LH                                HISTORY & PHYSICAL   PRESENTING COMPLAINT:  Epigastric pain, fullness and anorexia.   SUBJECTIVE:  Jeryn is a 45 year old African American female who was  initially seen on 03/29/04 with upper abdominal pain and diarrhea.  She had  laparoscopic cholecystectomy in August of 2005.  She has not felt well since  surgery, although she has not experienced any sharp pain.  She continues to  have dull epigastric pain, fullness and anorexia.  She feels her food is not  digesting.  She was last seen, she as also complaining of diarrhea.  She was  treated with Aciphex and Levbid.  CBC revealed microcytic anemia which was  subsequently confirmed to be iron deficiency.  Her H>P serology was  negative.  Her LFT's were normal.  Her anemia profile revealed serum iron of  41, TIBC of 358, saturation of 11%, B12 of 51, folate 12, and ferritin was  7.  Her stool was guaiac negative.  Her anemia was felt to be due to  menstrual losses.  She was begun on iron therapy which she could not  tolerate.  She also developed side effects of Levbid which was discontinued.   She does not feel much better.  However, she is not having diarrhea any  more.  She feels tired and fatigued all of the time.  She forces herself to  eat.  She has not lost any weight.  She denies melena or rectal bleeding.  Her periods generally have been heavy.  She has not had any vomiting, fever  or chills.  She has had nausea.  She can not tolerate bacon and sausage any  more.  She does better with fruits and vegetables.  She does not take any  NSAIDs.   She is on Aciphex 20 mg, MVI with iron daily, and Tylenol  p.r.n.   She also complains of a rash which she developed over the last few days.   PAST MEDICAL HISTORY:  She had laparoscopic cholecystectomy in August of  2005.  She has a history of anemia secondary to heavy periods. This was  corrected with iron therapy in the past.  She had C section in August of  2001.  She also had benign cyst removed from her right breast.   ALLERGIES:  None known.   FAMILY HISTORY:  Noncontributory.  Father died of MI at the age of 36  Mother is in good health as well as is one sister and three brothers.   SOCIAL HISTORY:  She is married.  She has no children.  She works at [employer]  where she has been for 17 years.  She does not smoke cigarettes.  She drinks  alcohol very occasionally.   PHYSICAL EXAMINATION:  VITAL SIGNS:  She weighs 156 pounds.  She is 68  inches tall.  Pulse 80 per minute.  Blood pressure 110/76.  She is afebrile.  HEENT:  Conjunctivae is pink.  Sclerae is nonicteric.  Oropharyngeal mucosa  is normal.  No neck masses are noted.  CARDIAC:  Regular rate, S1 and S2, no murmur or gallop noted.  LUNGS:  Clear to auscultation.  ABDOMEN:  Symmetrical.  Bowel sounds are normal.  There was mild tenderness  noted over epigastric area.  Abdomen is soft, nontender, mid-epigastric  area. No organomegaly or masses.  EXTREMITIES:  She does not have edema or  clubbing.  Nail beds appear to be somewhat pale.   ASSESSMENT:  Lenisha is 45 year old African American female who remains with  epigastric pain with sensation of fullness and anorexia.  She has not  responded to PPI.  H. Pylori serology was negative.  She also has iron-  deficiency anemia which appears to be unrelated to her GI symptomatology and  most likely due to heavy periods.   Her diarrhea has resolved.  She does have macular rash over her neck as  mentioned above.  This is probably drug induced.   PLAN:  1.  Stop her Aciphex and start her on Prevacid 30 mg p.o. q.a.m.  2.  Diagnostic  esophagogastroduodenoscopy to be performed at [facility] in the near      future.  She will have H&H prior to the procedure.  Further      recommendation will be made based on endoscopic findings.      NR/MEDQ  D:  05/18/2004  T:  05/19/2004  Job:  [id number]   cc:   [name], M.D.  Fax: [phone]   [facility]  Fax: [phone]

## 2010-09-15 NOTE — Op Note (Signed)
NAME:  Veronica Dudley, Veronica Dudley                         ACCOUNT NO.:  [id number]   MEDICAL RECORD NO.:  [id number]                   PATIENT TYPE:  AMB   LOCATION:  SDC                                  FACILITY:  [facility]   PHYSICIAN:  [name], M.D.                DATE OF BIRTH:  09-Mar-1966   DATE OF PROCEDURE:  07/24/2002  DATE OF DISCHARGE:                                 OPERATIVE REPORT   PREOPERATIVE DIAGNOSIS:  Incisional mass.   POSTOPERATIVE DIAGNOSIS:  Incisional mass.   PROCEDURE:  Excisional of incisional mass.   SURGEON:  [name], M.D.   ANESTHESIA:  MAC, local with 1% lidocaine with 1:200,000 epinephrine.   ESTIMATED BLOOD LOSS:  Minimal.   URINE OUTPUT:  Not measured.   COMPLICATIONS:  None.   INDICATIONS:  The patient is a 45 year old gravida 2, para 2, African  American female, status post laparotomy with right oophorectomy for  endometriosis and status post low segment cesarean section for placenta  previa, most recently in August 2001, who presented to the office with left  incisional pain and a mass of several months duration.  The patient denied  nausea and vomiting.  The pain along the incision was not worse during her  menstruation.  The patient was found to have a 1.5-2.0 cm subcutaneous,  slightly tender mass along her left aspect of the Pfannenstiel incision.  A  plan was made for excisional exploration and removal of the mass after the  risks, benefits, and alternatives were discussed with her.   FINDINGS:  There was a 1.5 cm, firm, subcutaneous abdominal wall mass that a  Pfannenstiel incision appreciated along the left-hand side of the incision.  The fascia was noted to be intact.  The mass was found to be most consistent  with a stitch granuloma.   SPECIMENS:  The incisional mass was sent to pathology.   DESCRIPTION OF PROCEDURE:  The patient was identified in the preoperative  holding area.  She did receive an IV and Ancef 1 g  intravenously for  antibiotics prophylaxis.  The patient was brought to the operating room and  received MAC anesthesia.  The abdomen was sterilely prepped and draped.  The  skin was injected with 1% lidocaine.  The incision was then incised along  the line of the patient's previous incision, and the skin was opened to a  total of approximately 4.5 cm of length.  The subcutaneous tissue was  injected with 1% lidocaine with 1:200,000 of epinephrine.  The firm mass was  identified and was grasped with an Allis clamp and the mass was then sharply  excised on the surrounding subcutaneous tissues.  It was set aside and sent  to pathology.  The subcutaneous tissue was then cauterized with monopolar  cautery for excellent hemostasis.  The fascia was noted to be intact and not  interrupted.  The skin was  closed at this time with a subcuticular suture of  3-0 Vicryl.  Steri-Strips and Benzoin were then placed over the incision  followed by a sterile bandage.  There were no complications to the  procedure.  All needle, instrument and sponge counts were correct.                                               [name], M.D.    BES/MEDQ  D:  07/24/2002  T:  07/25/2002  Job:  [id number]

## 2010-09-16 ENCOUNTER — Ambulatory Visit
Admission: RE | Admit: 2010-09-16 | Discharge: 2010-09-16 | Disposition: A | Discharge status: Home / Self Care | Payer: BC Managed Care – PPO | Source: Ambulatory Visit | Attending: General Surgery | Admitting: General Surgery

## 2010-09-16 DIAGNOSIS — Z9889 Other specified postprocedural states: Secondary | ICD-10-CM

## 2010-09-16 MED ORDER — GADOBENATE DIMEGLUMINE 529 MG/ML IV SOLN
14.0000 mL | Freq: Once | INTRAVENOUS | Status: AC | PRN
  Administered 2010-09-16: 14 mL via INTRAVENOUS

## 2010-09-20 ENCOUNTER — Other Ambulatory Visit: Payer: Self-pay | Admitting: Diagnostic Radiology

## 2010-09-20 ENCOUNTER — Ambulatory Visit
Admission: RE | Admit: 2010-09-20 | Discharge: 2010-09-20 | Disposition: A | Discharge status: Home / Self Care | Payer: BC Managed Care – PPO | Source: Ambulatory Visit | Attending: General Surgery | Admitting: General Surgery

## 2010-09-20 DIAGNOSIS — Z9889 Other specified postprocedural states: Secondary | ICD-10-CM

## 2010-11-13 ENCOUNTER — Encounter (INDEPENDENT_AMBULATORY_CARE_PROVIDER_SITE_OTHER): Payer: BC Managed Care – PPO | Admitting: General Surgery

## 2011-03-07 ENCOUNTER — Encounter (HOSPITAL_COMMUNITY): Payer: Self-pay | Admitting: Oncology

## 2011-03-07 ENCOUNTER — Encounter (HOSPITAL_COMMUNITY): Payer: BC Managed Care – PPO | Attending: Oncology | Admitting: Oncology

## 2011-03-07 VITALS — BP 137/88 | HR 69 | Temp 98.9°F | Ht 68.0 in | Wt 171.6 lb

## 2011-03-07 DIAGNOSIS — D0590 Unspecified type of carcinoma in situ of unspecified breast: Secondary | ICD-10-CM

## 2011-03-07 DIAGNOSIS — Z853 Personal history of malignant neoplasm of breast: Secondary | ICD-10-CM | POA: Insufficient documentation

## 2011-03-07 DIAGNOSIS — D649 Anemia, unspecified: Secondary | ICD-10-CM

## 2011-03-07 DIAGNOSIS — D509 Iron deficiency anemia, unspecified: Secondary | ICD-10-CM | POA: Insufficient documentation

## 2011-03-07 DIAGNOSIS — Z09 Encounter for follow-up examination after completed treatment for conditions other than malignant neoplasm: Secondary | ICD-10-CM | POA: Insufficient documentation

## 2011-03-07 DIAGNOSIS — Z170 Estrogen receptor positive status [ER+]: Secondary | ICD-10-CM

## 2011-03-07 LAB — CBC
Hemoglobin: 11.9 g/dL — ABNORMAL LOW (ref 12.0–15.0)
MCHC: 32.2 g/dL (ref 30.0–36.0)

## 2011-03-07 LAB — IRON AND TIBC
Iron: 89 ug/dL (ref 42–135)
UIBC: 231 ug/dL (ref 125–400)

## 2011-03-07 NOTE — Patient Instructions (Signed)
[facility]  Discharge Instructions  RECOMMENDATIONS MADE BY THE CONSULTANT AND ANY TEST RESULTS WILL BE SENT TO YOUR REFERRING DOCTOR.   EXAM FINDINGS BY MD TODAY AND SIGNS AND SYMPTOMS TO REPORT TO CLINIC OR PRIMARY MD: You are doing well.   INSTRUCTIONS GIVEN AND DISCUSSED: Lab work today. We will only call with abnormal results.  SPECIAL INSTRUCTIONS/FOLLOW-UP: Return to clinic in 6 months to see MD.   I acknowledge that I have been informed and understand all the instructions given to me and received a copy. I do not have any more questions at this time, but understand that I may call the Specialty Clinic at [facility] at [phone] during business hours should I have any further questions or need assistance in obtaining follow-up care.    __________________________________________  _____________  __________ Signature of Patient or Authorized Representative            Date                   Time    __________________________________________ Nurse's Signature

## 2011-03-07 NOTE — Progress Notes (Signed)
CC:   Veronica Dudley. Veronica Dudley, M.D. Veronica Dudley, M.D. Veronica Dudley, M.D. Veronica Dudley, M.D.  DIAGNOSES: 1. Ductal carcinoma in situ of the left breast intermediate grade with     a 2.7 cm sized process close to the chest wall but at least 2 mm of     negative margins were obtained.  It was ER positive 100%, PR 0 and     she is status post surgery followed by radiation therapy.  Her     surgery was on 10/22/2008.  She finished radiation on 01/21/2009.     We did not place her on tamoxifen after a long discussion.  She     elected not to do that. 2. 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 3. History of iron deficiency, off iron now for several months and we     will see what her counts are on blood work today. 4. History of endometriosis. 5. Cholecystectomy in 2005 by Dr. Franky Dudley. 6. Complete hysterectomy, oophorectomy and salpingectomy by Dr. Edward Dudley     in 2010.  She is not on replacement estrogen at this time. 7. Cesarean section 2001 by Dr. Langley Dudley. 8. Numbness in her left arm and hand which has been present since her     surgery. 9. History of smoking a pack a week for approximately 12 years     quitting after the birth of her first child who is now still 52-1/2     years old. Veronica Dudley is working full-time in the [employer].  She is very active.  She exercises on a regular basis.  Her review of systems is positive for the left arm tingling which is not new or different.  She is still having stress related difficulty sleeping at times because of her teenage son.  She does have a glass of wine several times a week, which I do not have a problem with.  PHYSICAL EXAMINATION:  Her vital signs today after negative oncologic review of systems really are very stable.  Weight is down another pound. She is 5 feet 8 inches tall.  Ideal weight is probably closer to 155, but if she could just lose 8-10 pounds she would probably be within normal BMI.  Her BMI today is  26.1.  Blood pressure 137/88 in the right arm sitting position, pulse 72 and regular, respirations 14 to 16 and unlabored.  She is afebrile.  She denies any pain presently.  She is in no acute distress.  She has no lymphadenopathy.  No thyromegaly.  She does not have true arm edema or leg edema.  Lungs:  Clear to auscultation and percussion.  Scars on the left breast and axillary are well healed.  She has no obvious masses.  The left breast is pigmented and she is finally starting to be able to look at herself in the mirror she states.  She states she and her husband still have an excellent personal relationship.  Her right breast is negative for masses.  Heart: Regular rhythm and rate without murmur, rub or gallop.  Abdomen:  Soft and nontender without organomegaly or masses.  So she looks great.  We will see her in 6 months.  Will check a CBC today and some iron studies.  Her only medication interestingly presently is just her calcium and vitamin D.  I suspect Dr. Edward Dudley will be checking her vitamin D levels and her bone density, but if she does not, we can certainly  help with that.    ______________________________ Veronica Dudley. Veronica Sleet, MD ESN/MEDQ  D:  03/07/2011  T:  03/07/2011  Job:  [id number]

## 2011-03-07 NOTE — Progress Notes (Signed)
This office note has been dictated.

## 2011-03-08 LAB — FERRITIN: Ferritin: 38 ng/mL (ref 10–291)

## 2011-04-27 ENCOUNTER — Other Ambulatory Visit: Payer: Self-pay

## 2011-04-27 ENCOUNTER — Encounter (HOSPITAL_COMMUNITY): Payer: Self-pay | Admitting: Emergency Medicine

## 2011-04-27 ENCOUNTER — Emergency Department (HOSPITAL_COMMUNITY)
Admission: EM | Admit: 2011-04-27 | Discharge: 2011-04-27 | Disposition: A | Discharge status: Home / Self Care | Payer: BC Managed Care – PPO | Attending: Emergency Medicine | Admitting: Emergency Medicine

## 2011-04-27 DIAGNOSIS — R142 Eructation: Secondary | ICD-10-CM | POA: Insufficient documentation

## 2011-04-27 DIAGNOSIS — K2970 Gastritis, unspecified, without bleeding: Secondary | ICD-10-CM | POA: Insufficient documentation

## 2011-04-27 DIAGNOSIS — K2990 Gastroduodenitis, unspecified, without bleeding: Secondary | ICD-10-CM | POA: Insufficient documentation

## 2011-04-27 DIAGNOSIS — Z853 Personal history of malignant neoplasm of breast: Secondary | ICD-10-CM | POA: Insufficient documentation

## 2011-04-27 DIAGNOSIS — R1013 Epigastric pain: Secondary | ICD-10-CM | POA: Insufficient documentation

## 2011-04-27 DIAGNOSIS — R079 Chest pain, unspecified: Secondary | ICD-10-CM | POA: Insufficient documentation

## 2011-04-27 DIAGNOSIS — R141 Gas pain: Secondary | ICD-10-CM | POA: Insufficient documentation

## 2011-04-27 DIAGNOSIS — R10816 Epigastric abdominal tenderness: Secondary | ICD-10-CM | POA: Insufficient documentation

## 2011-04-27 MED ORDER — FAMOTIDINE 20 MG PO TABS
20.0000 mg | ORAL_TABLET | Freq: Once | ORAL | Status: AC
  Administered 2011-04-27: 20 mg via ORAL
  Filled 2011-04-27: qty 1

## 2011-04-27 NOTE — ED Notes (Signed)
Patient is alert and oriented x 4 with respirations even and unlabored.  NAD at this time.  Discharge instructions reviewed with patient and patient verbalized understanding.  Pt ambulated to lobby with steady gait, and family member to transport pt home.  

## 2011-04-27 NOTE — ED Provider Notes (Signed)
History     CSN: [id number]  Arrival date & time 04/27/11  4098   First MD Initiated Contact with Patient 04/27/11 1831      Chief Complaint  Patient presents with  . Chest Pain    (Consider location/radiation/quality/duration/timing/severity/associated sxs/prior treatment) HPI Comments: Veronica Dudley is a 45 y.o. Female with a h/o breast cancer s/p radiation and lumpectomy,  Multiple myeloma, diabetes, hypertension, renal insufficiency , anemia, sickle cell anemia  who presents to the Emergency Department complaining of  2 weeks of a fluttering sensation in her central chest/epigastric area that is worse at night. It has been associated with an increase in burping. Sensation does not radiate. Denies fever, chills, shortness of breath, nausea, vomiting. She reports that today she had discomfort that went through to her shoulder blades. She has taken no medicines.   PCP Dr. Phillips Odor Oncologist Dr. Mariel Sleet  Patient is a 45 y.o. female presenting with chest pain.  Chest Pain     Past Medical History  Diagnosis Date  . Cancer     lt breast ca 2010/surg-lumpectomy/rad tx  . Breast cancer 6/10    DCIS  . Endometriosis   . Iron deficiency anemia     Past Surgical History  Procedure Date  . Abdominal hysterectomy   . Cholecystectomy   . Cesarean section   . Lumpectomy     left    Family History  Problem Relation Age of Onset  . Heart attack Father   . Cancer Maternal Aunt     ovarian cancer    History  Substance Use Topics  . Smoking status: Former Games developer  . Smokeless tobacco: Never Used  . Alcohol Use: 1.2 oz/week    2 Glasses of wine per week     red wine, glass a night    OB History    Grav Para Term Preterm Abortions TAB SAB Ect Mult Living                  Review of Systems  Cardiovascular: Positive for chest pain.  10 Systems reviewed and are negative for acute change except as noted in the HPI.  Allergies  Review of patient's allergies  indicates no known allergies.  Home Medications   Current Outpatient Rx  Name Route Sig Dispense Refill  . CALCIUM CARBONATE-VITAMIN D 500-200 MG-UNIT PO TABS Oral Take 1 tablet by mouth daily.        BP 115/79  Pulse 70  Temp 97.9 F (36.6 C)  Resp 18  Ht 5\' 8"  (1.727 m)  Wt 168 lb (76.204 kg)  BMI 25.54 kg/m2  SpO2 100%  Physical Exam  Nursing note and vitals reviewed. Constitutional: She is oriented to person, place, and time. She appears well-developed and well-nourished. No distress.  HENT:  Head: Normocephalic.  Right Ear: External ear normal.  Left Ear: External ear normal.  Nose: Nose normal.  Mouth/Throat: Oropharynx is clear and moist.  Eyes: Conjunctivae and EOM are normal.  Neck: Normal range of motion. Neck supple.  Cardiovascular: Normal rate and intact distal pulses.   Pulmonary/Chest:       S/p lumpectomy to left breast  Abdominal: Soft. She exhibits no distension and no mass. There is tenderness. There is no rebound and no guarding.       Mild epigastric tenderness to palpation  Musculoskeletal: Normal range of motion.  Neurological: She is alert and oriented to person, place, and time. She has normal reflexes.  Skin: Skin is warm  and dry.    ED Course  Procedures (including critical care time)  Date: 04/27/2011  1836  Rate:68  Rhythm: normal sinus rhythm  QRS Axis: normal  Intervals: normal  ST/T Wave abnormalities: normal  Conduction Disutrbances:none  Narrative Interpretation:   Old EKG Reviewed: none available    MDM  Patient with epigastric sub sternal fluttering for two weeks.  Developed discomfort that went to her shoulder blades today. Normal EKG. H/o breast cancer and s/p chole. Given pepcid. Follow up with PCP.Pt stable in ED with no significant deterioration in condition.The patient appears reasonably screened and/or stabilized for discharge and I doubt any other medical condition or other [facility] requiring further screening, evaluation,  or treatment in the ED at this time prior to discharge.  MDM Reviewed: nursing note, vitals and previous chart         Nicoletta Dress. Colon Branch, MD 04/27/11 1911

## 2011-04-27 NOTE — ED Notes (Signed)
Pt c/o "fluttering in chest x 2 weeks" . Pt reports pain between shoulder blades today that has not went away. Some lightheadedness. Denies sob/v/n.

## 2011-09-07 ENCOUNTER — Encounter (HOSPITAL_COMMUNITY): Payer: Self-pay | Admitting: Oncology

## 2011-09-07 ENCOUNTER — Encounter (HOSPITAL_COMMUNITY): Payer: BC Managed Care – PPO | Attending: Oncology | Admitting: Oncology

## 2011-09-07 ENCOUNTER — Ambulatory Visit (HOSPITAL_COMMUNITY): Payer: BC Managed Care – PPO | Admitting: Oncology

## 2011-09-07 ENCOUNTER — Other Ambulatory Visit (HOSPITAL_COMMUNITY): Payer: Self-pay | Admitting: Oncology

## 2011-09-07 VITALS — BP 107/74 | HR 76 | Temp 98.3°F | Wt 171.2 lb

## 2011-09-07 DIAGNOSIS — D0510 Intraductal carcinoma in situ of unspecified breast: Secondary | ICD-10-CM | POA: Insufficient documentation

## 2011-09-07 DIAGNOSIS — D0590 Unspecified type of carcinoma in situ of unspecified breast: Secondary | ICD-10-CM

## 2011-09-07 DIAGNOSIS — D509 Iron deficiency anemia, unspecified: Secondary | ICD-10-CM | POA: Insufficient documentation

## 2011-09-07 DIAGNOSIS — F329 Major depressive disorder, single episode, unspecified: Secondary | ICD-10-CM

## 2011-09-07 DIAGNOSIS — Z9889 Other specified postprocedural states: Secondary | ICD-10-CM

## 2011-09-07 DIAGNOSIS — C50919 Malignant neoplasm of unspecified site of unspecified female breast: Secondary | ICD-10-CM

## 2011-09-07 HISTORY — DX: Malignant neoplasm of unspecified site of unspecified female breast: C50.919

## 2011-09-07 HISTORY — DX: Iron deficiency anemia, unspecified: D50.9

## 2011-09-07 NOTE — Patient Instructions (Signed)
[facility]  Discharge Instructions  RECOMMENDATIONS MADE BY THE CONSULTANT AND ANY TEST RESULTS WILL BE SENT TO YOUR REFERRING DOCTOR.   Return to clinic in 6 months to see Dr.Neijstrom.   I acknowledge that I have been informed and understand all the instructions given to me and received a copy. I do not have any more questions at this time, but understand that I may call the Specialty Clinic at [facility] at [phone] during business hours should I have any further questions or need assistance in obtaining follow-up care.    __________________________________________  _____________  __________ Signature of Patient or Authorized Representative            Date                   Time    __________________________________________ Nurse's Signature

## 2011-09-07 NOTE — Progress Notes (Signed)
Colette Ribas, MD, MD [address] [place] [address]  1. Breast CA  CBC, Differential, Comprehensive metabolic panel  2. Iron deficiency anemia  Ferritin, Iron and TIBC    CURRENT THERAPY: Observation  INTERVAL HISTORY: Veronica Dudley 46 y.o. female returns for  regular  visit for followup of  Ductal carcinoma in situ of the left breast intermediate grade with a 2.7 cm sized process close to the chest wall but at least 2 mm of negative margins were obtained. It was ER positive 100%, PR 0 and she is status post surgery followed by radiation therapy. Her surgery was on 10/22/2008. She finished radiation on 01/21/2009. We did not place her on tamoxifen after a long discussion. She elected not to do that.   The patient denies any complaints.  She is due for her yearly mammogram this month but has not received any communication to get this scheduled.  I have asked our nurse and scheduler to help her get this scheduled.  She continues to not take her PO iron and her iron stores have been maintaining well.  She has been off of it for 8 months or more.   She asks questions regarding her work out regimen.  She has been asked to lose 8 lbs and despite a rigorous workout routine, she has not been able to lose any weight.  I explained to the patient that as she is working out, she is losing fat weight, but gaining muscle weight.  Muscle weighs more than fat.  I encouraged her to continue her workout routine.   She also asks about red wine.  She may have wine in moderation which is one glass of red wine 3-4 times per week.  She is agreeable to this.  I provided the patient with education regarding BPA free plastics at her request.  She is appreciative of this information.  She asked if protein shakes are healthy for her.  I have asked her to monitor the sugars associated with these protein shakes and opt for a low sugar brand/kind.  Oncologically, the patient denies any  complaints.   Past Medical History  Diagnosis Date  . Cancer     lt breast ca 2010/surg-lumpectomy/rad tx  . Breast cancer 6/10    DCIS  . Endometriosis   . Iron deficiency anemia   . Breast CA 09/07/2011  . Iron deficiency anemia 09/07/2011    has Ductal carcinoma in situ of breast and Iron deficiency anemia on her problem list.      has no known allergies.  Ms. Cavey does not currently have medications on file.  Past Surgical History  Procedure Date  . Abdominal hysterectomy   . Cholecystectomy   . Cesarean section   . Lumpectomy     left    Denies any headaches, dizziness, double vision, fevers, chills, night sweats, nausea, vomiting, diarrhea, constipation, chest pain, heart palpitations, shortness of breath, blood in stool, black tarry stool, urinary pain, urinary burning, urinary frequency, hematuria.   PHYSICAL EXAMINATION  ECOG PERFORMANCE STATUS: 0 - Asymptomatic  Filed Vitals:   09/07/11 0955  BP: 107/74  Pulse: 76  Temp: 98.3 F (36.8 C)    GENERAL:alert, healthy, no distress, well nourished, well developed, comfortable, cooperative and smiling SKIN: skin color, texture, turgor are normal, no rashes or significant lesions HEAD: Normocephalic, No masses, lesions, tenderness or abnormalities EYES: normal, EOMI, Conjunctiva are pink and non-injected EARS: External ears normal OROPHARYNX:lips, buccal mucosa,  and tongue normal and mucous membranes are moist  NECK: supple, no adenopathy, thyroid normal size, non-tender, without nodularity, no stridor, non-tender, trachea midline LYMPH:  no palpable lymphadenopathy, no hepatosplenomegaly BREAST:right breast normal without mass, skin or nipple changes or axillary nodes, left breast scar and axillary are  well healed. She has no obvious masses. The left breast is pigmented. LUNGS: clear to auscultation and percussion HEART: regular rate & rhythm, no murmurs, no gallops, S1 normal and S2 normal ABDOMEN:abdomen  soft, non-tender, normal bowel sounds, no masses or organomegaly and no hepatosplenomegaly BACK: Back symmetric, no curvature., No CVA tenderness EXTREMITIES:less then 2 second capillary refill, no joint deformities, effusion, or inflammation, no edema, no skin discoloration, no clubbing, no cyanosis  NEURO: alert & oriented x 3 with fluent speech, no focal motor/sensory deficits, gait normal    ASSESSMENT:  1. Ductal carcinoma in situ of the left breast intermediate grade with a 2.7 cm sized process close to the chest wall but at least 2 mm of negative margins were obtained. It was ER positive 100%, PR 0 and she is status post surgery followed by radiation therapy. Her surgery was on 10/22/2008. She finished radiation on 01/21/2009. We did not place her on tamoxifen after a long discussion. She elected not to do that.  2. 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  3. History of iron deficiency, off iron now for 6 months. 4. History of endometriosis.  5. Cholecystectomy in 2005 by Dr. Franky Macho.  6. Complete hysterectomy, oophorectomy and salpingectomy by Dr. Edward Jolly in 2010. She is not on replacement estrogen at this time.  7. Cesarean section 2001 by Dr. Langley Gauss.  8. Numbness in her left arm and hand which has been present since her surgery.  Improved, less frequent and less intense, but remains. 9. History of smoking a pack a week for approximately 12 years quitting after the birth of her first child who is now still 21 years old.   PLAN:  1. Encouraged the patient to continue her workout regimen. 2. Patient is due for her yearly screening mammogram this month.  She has not be contacted to have this scheduled.  I have asked our scheduler to make referral for mammogram. 3. Lab work in 6 months: CBC diff, CMET, Ferritin, IRON/TIBC. 4. Patient education regarding her left sided arm neuropathic discomfort.  This is improving, but remains. 5. Patient may have an occasional  glass of red wine.  6. Education regarding plastic water bottles and avoiding BPA products. 7. Return in 1 year for follow-up.    All questions were answered. The patient knows to call the clinic with any problems, questions or concerns. We can certainly see the patient much sooner if necessary.  The patient and plan discussed with Glenford Peers, MD and he is in agreement with the aforementioned.  Lorey Pallett

## 2011-09-19 ENCOUNTER — Ambulatory Visit (HOSPITAL_COMMUNITY)
Admission: RE | Admit: 2011-09-19 | Discharge: 2011-09-19 | Disposition: A | Discharge status: Home / Self Care | Payer: BC Managed Care – PPO | Source: Ambulatory Visit | Attending: Oncology | Admitting: Oncology

## 2011-09-19 DIAGNOSIS — Z853 Personal history of malignant neoplasm of breast: Secondary | ICD-10-CM | POA: Insufficient documentation

## 2011-09-19 DIAGNOSIS — Z9889 Other specified postprocedural states: Secondary | ICD-10-CM

## 2012-03-14 ENCOUNTER — Encounter (HOSPITAL_COMMUNITY): Payer: Self-pay | Admitting: Oncology

## 2012-03-14 ENCOUNTER — Encounter (HOSPITAL_COMMUNITY): Payer: BC Managed Care – PPO | Attending: Oncology | Admitting: Oncology

## 2012-03-14 VITALS — BP 111/71 | HR 85 | Temp 98.0°F | Resp 16 | Wt 172.4 lb

## 2012-03-14 DIAGNOSIS — Z09 Encounter for follow-up examination after completed treatment for conditions other than malignant neoplasm: Secondary | ICD-10-CM | POA: Insufficient documentation

## 2012-03-14 DIAGNOSIS — D0510 Intraductal carcinoma in situ of unspecified breast: Secondary | ICD-10-CM

## 2012-03-14 DIAGNOSIS — F329 Major depressive disorder, single episode, unspecified: Secondary | ICD-10-CM

## 2012-03-14 DIAGNOSIS — Z853 Personal history of malignant neoplasm of breast: Secondary | ICD-10-CM | POA: Insufficient documentation

## 2012-03-14 DIAGNOSIS — Z170 Estrogen receptor positive status [ER+]: Secondary | ICD-10-CM

## 2012-03-14 DIAGNOSIS — D509 Iron deficiency anemia, unspecified: Secondary | ICD-10-CM

## 2012-03-14 DIAGNOSIS — D0590 Unspecified type of carcinoma in situ of unspecified breast: Secondary | ICD-10-CM

## 2012-03-14 LAB — CBC WITH DIFFERENTIAL/PLATELET
Basophils Absolute: 0 10*3/uL (ref 0.0–0.1)
Basophils Relative: 1 % (ref 0–1)
Eosinophils Absolute: 0.1 10*3/uL (ref 0.0–0.7)
Eosinophils Relative: 2 % (ref 0–5)
HCT: 35.5 % — ABNORMAL LOW (ref 36.0–46.0)
MCH: 27.5 pg (ref 26.0–34.0)
MCHC: 32.1 g/dL (ref 30.0–36.0)
MCV: 85.7 fL (ref 78.0–100.0)
Monocytes Absolute: 0.2 10*3/uL (ref 0.1–1.0)
RDW: 12.8 % (ref 11.5–15.5)

## 2012-03-14 LAB — COMPREHENSIVE METABOLIC PANEL
AST: 17 U/L (ref 0–37)
Albumin: 3.9 g/dL (ref 3.5–5.2)
Calcium: 9.6 mg/dL (ref 8.4–10.5)
Creatinine, Ser: 0.62 mg/dL (ref 0.50–1.10)

## 2012-03-14 NOTE — Patient Instructions (Addendum)
[facility]  Discharge Instructions  RECOMMENDATIONS MADE BY THE CONSULTANT AND ANY TEST RESULTS WILL BE SENT TO YOUR REFERRING DOCTOR.   EXAM FINDINGS BY MD TODAY AND SIGNS AND SYMPTOMS TO REPORT TO CLINIC OR PRIMARY MD: exam and discussion by MD.  No evidence of recurrence by exam.  MEDICATIONS PRESCRIBED: none   INSTRUCTIONS GIVEN AND DISCUSSED: Other :Report any new lumps, bone pain or shortness of breath.  SPECIAL INSTRUCTIONS/FOLLOW-UP: Lab work Needed today and Return to Clinic in 6 months to see MD   I acknowledge that I have been informed and understand all the instructions given to me and received a copy. I do not have any more questions at this time, but understand that I may call the Specialty Clinic at [facility] at [phone] during business hours should I have any further questions or need assistance in obtaining follow-up care.    __________________________________________  _____________  __________ Signature of Patient or Authorized Representative            Date                   Time    __________________________________________ Nurse's Signature

## 2012-03-14 NOTE — Progress Notes (Signed)
Problem #1 DCIS of the left breast, intermediate grade, with a 2.7 cm lesion close to the chest wall but with at least 2 mm of negative margins at the time of surgery. This DCIS was ER +100%, PR 0% positive. She had surgery on 10/22/2008 followed by radiation therapy which finished on 01/21/2009 and thus far she has no recurrent disease. After a long discussion about tamoxifen she elected not to take that drug. Problem #2 history of iron deficiency and we'll check her laboratory values today Problem #3 reactive depression secondary to the above diagnosis of DCIS with self-esteem issues. She is also worried that she incurred this precancerous lesion because of something in her past life and has felt very guilty at times because of this. Problem #4 cholecystectomy 2005 by Dr. Lovell Sheehan Problem #5 history of endometriosis with complete hysterectomy, oophorectomy, and salpingectomy by Dr. Edward Jolly in 2010 on replacement estrogen cream Problem #6 history smoking a pack a week for approximately 12 years quitting many years ago. Problem #7 C-section in 2001 Problem #8 numbness in her left arm and hand present since surgery as well as numbness and discomfort in her left axilla also without change   She is working full-time at the bank still doing very very well. She is very active, still exercises on a regular basis namely 4 days out of 7. She has not lost any weight which she desires to do so I have asked her to increase her exercise either in length or in number of days per week. Her oncology review of systems remains absolutely unremarkable. Vital signs are very stable realistically. Lymph nodes remain negative throughout. She is still tender under her left axilla. There is no real arm or leg edema. The left breast has hyperpigmentation but no masses are present in either breast. Heart shows a regular rhythm and rate without murmur rub or gallop. Lungs are clear to auscultation and percussion. Skin exam is  remarkable for possible tinea versicolor on her back in various places. She is not symptomatic from the. Abdomen remains soft. She had a little tenderness in left upper quadrant but I cannot feel her spleen and she has normal bowel sounds no distention no ascites. He was only tender when I have palpated her not at all at home or at work. She states her bowel function is fine no change whatsoever and no blood in her stools. She has no leg edema.  We'll see what her laboratory work shows will see her in 6 months. If all is well then we'll see her once a year

## 2012-03-15 LAB — IRON AND TIBC
Iron: 94 ug/dL (ref 42–135)
Saturation Ratios: 34 % (ref 20–55)
UIBC: 186 ug/dL (ref 125–400)

## 2012-03-15 LAB — FERRITIN: Ferritin: 44 ng/mL (ref 10–291)

## 2012-03-15 LAB — TSH: TSH: 0.951 u[IU]/mL (ref 0.350–4.500)

## 2012-09-08 ENCOUNTER — Other Ambulatory Visit (HOSPITAL_COMMUNITY): Payer: Self-pay | Admitting: Oncology

## 2012-09-08 DIAGNOSIS — Z9889 Other specified postprocedural states: Secondary | ICD-10-CM

## 2012-09-15 ENCOUNTER — Encounter (HOSPITAL_COMMUNITY): Payer: Self-pay

## 2012-09-15 ENCOUNTER — Ambulatory Visit (HOSPITAL_COMMUNITY)
Admission: RE | Admit: 2012-09-15 | Discharge: 2012-09-15 | Disposition: A | Discharge status: Home / Self Care | Payer: BC Managed Care – PPO | Source: Ambulatory Visit | Attending: Hematology and Oncology | Admitting: Hematology and Oncology

## 2012-09-15 ENCOUNTER — Encounter (HOSPITAL_COMMUNITY): Payer: BC Managed Care – PPO | Attending: Hematology and Oncology

## 2012-09-15 VITALS — BP 122/77 | HR 72 | Temp 98.5°F | Resp 16 | Wt 165.6 lb

## 2012-09-15 DIAGNOSIS — D0512 Intraductal carcinoma in situ of left breast: Secondary | ICD-10-CM

## 2012-09-15 DIAGNOSIS — R079 Chest pain, unspecified: Secondary | ICD-10-CM

## 2012-09-15 DIAGNOSIS — Z09 Encounter for follow-up examination after completed treatment for conditions other than malignant neoplasm: Secondary | ICD-10-CM | POA: Insufficient documentation

## 2012-09-15 DIAGNOSIS — Z170 Estrogen receptor positive status [ER+]: Secondary | ICD-10-CM

## 2012-09-15 DIAGNOSIS — M549 Dorsalgia, unspecified: Secondary | ICD-10-CM

## 2012-09-15 DIAGNOSIS — D0590 Unspecified type of carcinoma in situ of unspecified breast: Secondary | ICD-10-CM

## 2012-09-15 DIAGNOSIS — D509 Iron deficiency anemia, unspecified: Secondary | ICD-10-CM

## 2012-09-15 DIAGNOSIS — Z853 Personal history of malignant neoplasm of breast: Secondary | ICD-10-CM | POA: Insufficient documentation

## 2012-09-15 LAB — CBC WITH DIFFERENTIAL/PLATELET
Eosinophils Absolute: 0.1 10*3/uL (ref 0.0–0.7)
HCT: 33.6 % — ABNORMAL LOW (ref 36.0–46.0)
Hemoglobin: 11 g/dL — ABNORMAL LOW (ref 12.0–15.0)
Lymphs Abs: 1.3 10*3/uL (ref 0.7–4.0)
MCH: 28.4 pg (ref 26.0–34.0)
Monocytes Absolute: 0.2 10*3/uL (ref 0.1–1.0)
Monocytes Relative: 4 % (ref 3–12)
Neutro Abs: 3.3 10*3/uL (ref 1.7–7.7)
Neutrophils Relative %: 68 % (ref 43–77)
RBC: 3.88 MIL/uL (ref 3.87–5.11)

## 2012-09-15 LAB — IRON AND TIBC: Saturation Ratios: 49 % (ref 20–55)

## 2012-09-15 NOTE — Progress Notes (Addendum)
Patient ID: Veronica Dudley, female   DOB: December 13, 1965, 47 y.o.   MRN: [id number]      [facility] [phone]   [phone]  OFFICE PROGRESS NOTE  Colette Ribas, MD [address] [place] [address]  DIAGNOSIS: DCIS of the Left breast,ER+/PR-  INTERVAL HISTORY: Veronica Dudley 47 y.o. female returns to the clinic today for follow up of DCIS of the left breat resected on 09/28/2008.Patient declined endocrine therapy with Tamoxifen citing side effect concerns.  She reports pain the upper mid back mostly on the left side.She does not recall any associated trauma and has since the weather been getting warmer not exercised with weights, rather takes walks. Pain is worse on deep breathing.She denies associated SOB. Denies history of back problems  MEDICAL HISTORY: Past Medical History  Diagnosis Date  . Cancer     lt breast ca 2010/surg-lumpectomy/rad tx  . Breast cancer 6/10    DCIS  . Endometriosis   . Iron deficiency anemia   . Breast CA 09/07/2011  . Iron deficiency anemia 09/07/2011    ALLERGIES:  has No Known Allergies.  MEDICATIONS:  Current Outpatient Prescriptions  Medication Sig Dispense Refill  . calcium-vitamin D (OSCAL WITH D) 500-200 MG-UNIT per tablet Take 1 tablet by mouth daily.        . vitamin B-12 (CYANOCOBALAMIN) 500 MCG tablet Take 500 mcg by mouth daily.       No current facility-administered medications for this visit.    SURGICAL HISTORY:  Past Surgical History  Procedure Laterality Date  . Abdominal hysterectomy    . Cholecystectomy    . Cesarean section    . Lumpectomy      left    REVIEW OF SYSTEMS:  As in the history above otherwise negative.  PHYSICAL EXAMINATION:  Blood pressure 122/77, pulse 72, temperature 98.5 F (36.9 C), temperature source Oral, resp. rate 16, weight 165 lb 9.6 oz (75.116 kg).  GENERAL: No distress, well nourished.  SKIN:  No rashes or significant lesions   LYMPH: No palpable lymphadenopathy, in the cervical,  supraclavicular or axillary areas.  BREAST:Normal without mass, skin or nipple changes or axillary nodes, well healed left and axillary breast scars . LUNGS: clear to auscultation , no crackles or wheezes HEART: regular rate & rhythm, no murmurs, no gallops, S1 normal and S2 normal  ABDOMEN: Abdomen soft, non-tender, normal bowel sounds, no masses or organomegaly and no hepatosplenomegaly  UXL:KGMW tenderness on percussion of the left mid rib cage posteriorly. EXTREMITIES: No edema, no skin discoloration or tenderness NEURO: alert & oriented , no focal motor/sensory deficits.   Blood pressure 122/77, pulse 72, temperature 98.5 F (36.9 C), temperature source Oral, resp. rate 16, weight 75.116 kg (165 lb 9.6 oz).  LABORATORY DATA: Lab Results  Component Value Date   WBC 3.9* 03/14/2012   HGB 11.4* 03/14/2012   HCT 35.5* 03/14/2012   MCV 85.7 03/14/2012   PLT 216 03/14/2012      Chemistry      Component Value Date/Time   NA 140 03/14/2012 1000   K 4.0 03/14/2012 1000   CL 104 03/14/2012 1000   CO2 28 03/14/2012 1000   BUN 15 03/14/2012 1000   CREATININE 0.62 03/14/2012 1000      Component Value Date/Time   CALCIUM 9.6 03/14/2012 1000   ALKPHOS 74 03/14/2012 1000   AST 17 03/14/2012 1000   ALT 10 03/14/2012 1000   BILITOT  0.3 03/14/2012 1000       RADIOGRAPHIC STUDIES: No results found.  ASSESSMENT: 1. DCIS of the left breast.She had no evidence of disease at this time. 2. Left posterior chest pain: I suspect musculoskeletal chest wall pain. Risk of PE is very low.Besides there is no dyspnea associated with pain and no leg edema and she is not on Tamoxifen.   PLAN: 1.I ordered CXR. I will follow up on the result. 2.I discussed Genetic counseling for BRCA 1 and 2 testing given Br cancer prior to age 40 per NCCN guidelines.She declines. She understands that she could benefit in preventing potential cancer relating  to the mutation with testing. 3.She will return to clinic in 6 months. Mammogram is reportedly scheduled next week. 4.We also discussed the role of Tamoxifen in chemoprevention. 5.I will repeat CBC and iron studies today. 6.We talked about Vitamin  supplements as she had asked about benefit and I we discussed that most trials do not show any benefit.   All questions were answered.  I spent 20 minutes counseling the patient face to face. The total time spent in the appointment was 40 minutes  .   Wendy Hoback,MD,FACP. Hematologist/ Oncologist.     Addendum: CXR was negative. I instructed French Ana RN to inform patient to take Ibuprofen 400 mg tid x 2 days. If pain ids not better to call and we can potentially order a chest CT.

## 2012-09-15 NOTE — Patient Instructions (Addendum)
[facility] Discharge Instructions  RECOMMENDATIONS MADE BY THE CONSULTANT AND ANY TEST RESULTS WILL BE SENT TO YOUR REFERRING PHYSICIAN.  EXAM FINDINGS BY THE PHYSICIAN TODAY AND SIGNS OR SYMPTOMS TO REPORT TO CLINIC OR PRIMARY PHYSICIAN: Exam and discussion by Dr. Mikki Santee.  No evidence of recurrence by exam.  Will check some blood work today and will check chest xray.  MEDICATIONS PRESCRIBED:  none  INSTRUCTIONS GIVEN AND DISCUSSED: Report any new lumps, bone pain, shortness of breath or other symptoms.  SPECIAL INSTRUCTIONS/FOLLOW-UP: Follow-up in 6 months.  Thank you for choosing [facility] to provide your oncology and hematology care.  To afford each patient quality time with our providers, please arrive at least 15 minutes before your scheduled appointment time.  With your help, our goal is to use those 15 minutes to complete the necessary work-up to ensure our physicians have the information they need to help with your evaluation and healthcare recommendations.    Effective January 1st, 2014, we ask that you re-schedule your appointment with our physicians should you arrive 10 or more minutes late for your appointment.  We strive to give you quality time with our providers, and arriving late affects you and other patients whose appointments are after yours.    Again, thank you for choosing [facility].  Our hope is that these requests will decrease the amount of time that you wait before being seen by our physicians.       _____________________________________________________________  Should you have questions after your visit to [facility], please contact our office at [phone] between the hours of 8:30 a.m. and 5:00 p.m.  Voicemails left after 4:30 p.m. will not be returned until the following business day.  For prescription refill requests, have your pharmacy contact our office with your prescription refill  request.

## 2012-09-24 ENCOUNTER — Ambulatory Visit (HOSPITAL_COMMUNITY)
Admission: RE | Admit: 2012-09-24 | Discharge: 2012-09-24 | Disposition: A | Discharge status: Home / Self Care | Payer: BC Managed Care – PPO | Source: Ambulatory Visit | Attending: Oncology | Admitting: Oncology

## 2012-09-24 DIAGNOSIS — Z853 Personal history of malignant neoplasm of breast: Secondary | ICD-10-CM | POA: Insufficient documentation

## 2012-09-24 DIAGNOSIS — Z9889 Other specified postprocedural states: Secondary | ICD-10-CM

## 2013-03-18 ENCOUNTER — Encounter (HOSPITAL_COMMUNITY): Payer: BC Managed Care – PPO | Attending: Hematology and Oncology

## 2013-03-18 ENCOUNTER — Encounter (HOSPITAL_COMMUNITY): Payer: Self-pay

## 2013-03-18 VITALS — BP 125/75 | HR 109 | Temp 98.0°F | Resp 16 | Wt 167.4 lb

## 2013-03-18 DIAGNOSIS — Z853 Personal history of malignant neoplasm of breast: Secondary | ICD-10-CM | POA: Insufficient documentation

## 2013-03-18 DIAGNOSIS — N809 Endometriosis, unspecified: Secondary | ICD-10-CM

## 2013-03-18 DIAGNOSIS — D509 Iron deficiency anemia, unspecified: Secondary | ICD-10-CM | POA: Insufficient documentation

## 2013-03-18 DIAGNOSIS — D0590 Unspecified type of carcinoma in situ of unspecified breast: Secondary | ICD-10-CM

## 2013-03-18 DIAGNOSIS — Z09 Encounter for follow-up examination after completed treatment for conditions other than malignant neoplasm: Secondary | ICD-10-CM | POA: Insufficient documentation

## 2013-03-18 DIAGNOSIS — D0510 Intraductal carcinoma in situ of unspecified breast: Secondary | ICD-10-CM

## 2013-03-18 LAB — CBC WITH DIFFERENTIAL/PLATELET
Basophils Absolute: 0 10*3/uL (ref 0.0–0.1)
Basophils Relative: 0 % (ref 0–1)
Eosinophils Relative: 3 % (ref 0–5)
HCT: 34.5 % — ABNORMAL LOW (ref 36.0–46.0)
Hemoglobin: 11 g/dL — ABNORMAL LOW (ref 12.0–15.0)
Lymphs Abs: 1.3 10*3/uL (ref 0.7–4.0)
MCH: 27.7 pg (ref 26.0–34.0)
Monocytes Absolute: 0.2 10*3/uL (ref 0.1–1.0)
Monocytes Relative: 5 % (ref 3–12)
Neutro Abs: 1.9 10*3/uL (ref 1.7–7.7)
Neutrophils Relative %: 55 % (ref 43–77)
RBC: 3.97 MIL/uL (ref 3.87–5.11)

## 2013-03-18 LAB — FERRITIN: Ferritin: 27 ng/mL (ref 10–291)

## 2013-03-18 LAB — COMPREHENSIVE METABOLIC PANEL
AST: 19 U/L (ref 0–37)
Alkaline Phosphatase: 68 U/L (ref 39–117)
BUN: 16 mg/dL (ref 6–23)
Chloride: 105 mEq/L (ref 96–112)
Creatinine, Ser: 0.65 mg/dL (ref 0.50–1.10)
GFR calc Af Amer: >90 mL/min (ref >90)
GFR calc non Af Amer: >90 mL/min (ref >90)
Glucose, Bld: 98 mg/dL (ref 70–99)
Potassium: 3.7 mEq/L (ref 3.5–5.1)
Total Bilirubin: 0.4 mg/dL (ref 0.3–1.2)

## 2013-03-18 NOTE — Progress Notes (Signed)
[facility]  OFFICE PROGRESS NOTE  Colette Ribas, MD [address] [place] [address]  DIAGNOSIS: Ductal carcinoma in situ of breast, unspecified laterality - Plan: CBC with Differential, Comprehensive metabolic panel, CBC with Differential, Comprehensive metabolic panel, MM Digital Screening  Iron deficiency anemia - Plan: Ferritin, Ferritin  Chief Complaint  Patient presents with  . Anemia    iron deficiency  . DCIS    CURRENT THERAPY: Watchful expectation  INTERVAL HISTORY: Veronica Dudley 47 y.o. female returns for followup of noninvasive ductal neoplasia of the breast, status post lumpectomy on no additional therapy (refused by patient) systemically but did receive radiotherapy. She continues to do well with good appetite. She denies any nausea, vomiting, swelling of her left upper extremity, significant hot flashes, diarrhea, constipation, melena, hematochezia, hematuria, vaginal bleeding or discharge, left upper extremity lymphedema, cough, wheezing, but with chronic nasal drip without sore throat, earache, skin rash, joint pain, headache, or seizures.  MEDICAL HISTORY: Past Medical History  Diagnosis Date  . Cancer     lt breast ca 2010/surg-lumpectomy/rad tx  . Breast cancer 6/10    DCIS  . Endometriosis   . Iron deficiency anemia   . Breast CA 09/07/2011  . Iron deficiency anemia 09/07/2011    INTERIM HISTORY: has Ductal carcinoma in situ of breast and Iron deficiency anemia on her problem list.    ALLERGIES:  has No Known Allergies.  MEDICATIONS: has a current medication list which includes the following prescription(s): calcium-vitamin d and vitamin b-12.  SURGICAL HISTORY:  Past Surgical History  Procedure Laterality Date  . Cholecystectomy    . Cesarean section    . Lumpectomy      left  . Bilateral oophorectomy Bilateral     FAMILY HISTORY: family history includes Cancer in  her maternal aunt; Heart attack in her father.  SOCIAL HISTORY:  reports that she has quit smoking. She has never used smokeless tobacco. She reports that she drinks about 1.2 ounces of alcohol per week. She reports that she does not use illicit drugs.  REVIEW OF SYSTEMS:  Other than that discussed above is noncontributory.  PHYSICAL EXAMINATION: ECOG PERFORMANCE STATUS: 0 - Asymptomatic  Blood pressure 125/75, pulse 109, temperature 98 F (36.7 C), temperature source Oral, resp. rate 16, weight 167 lb 6.4 oz (75.932 kg).  GENERAL:alert, no distress and comfortable SKIN: skin color, texture, turgor are normal, no rashes or significant lesions EYES: PERLA; Conjunctiva are pink and non-injected, sclera clear OROPHARYNX:no exudate, no erythema on lips, buccal mucosa, or tongue. NECK: supple, thyroid normal size, non-tender, without nodularity. No masses CHEST: Status post left breast lumpectomy with hyperpigmentation changes of radiation. Bilateral fibrocystic changes with no lymphadenopathy. LYMPH:  no palpable lymphadenopathy in the cervical, axillary or inguinal LUNGS: clear to auscultation and percussion with normal breathing effort HEART: regular rate & rhythm and no murmurs. ABDOMEN:abdomen soft, non-tender and normal bowel sounds MUSCULOSKELETAL:no cyanosis of digits and no clubbing. Range of motion normal.  NEURO: alert & oriented x 3 with fluent speech, no focal motor/sensory deficits   LABORATORY DATA: Office Visit on 03/18/2013  Component Date Value Range Status  . WBC 03/18/2013 3.4* 4.0 - 10.5 K/uL Final  . RBC 03/18/2013 3.97  3.87 - 5.11 MIL/uL Final  . Hemoglobin 03/18/2013 11.0* 12.0 - 15.0 g/dL Final  . HCT 40/98/1191 34.5* 36.0 - 46.0 % Final  . MCV 03/18/2013 86.9  78.0 - 100.0 fL Final  . MCH 03/18/2013 27.7  26.0 - 34.0 pg Final  . MCHC 03/18/2013 31.9  30.0 - 36.0 g/dL Final  . RDW 40/98/1191 13.2  11.5 - 15.5 % Final  . Platelets 03/18/2013 222  150 - 400  K/uL Final  . Neutrophils Relative % 03/18/2013 55  43 - 77 % Final  . Neutro Abs 03/18/2013 1.9  1.7 - 7.7 K/uL Final  . Lymphocytes Relative 03/18/2013 38  12 - 46 % Final  . Lymphs Abs 03/18/2013 1.3  0.7 - 4.0 K/uL Final  . Monocytes Relative 03/18/2013 5  3 - 12 % Final  . Monocytes Absolute 03/18/2013 0.2  0.1 - 1.0 K/uL Final  . Eosinophils Relative 03/18/2013 3  0 - 5 % Final  . Eosinophils Absolute 03/18/2013 0.1  0.0 - 0.7 K/uL Final  . Basophils Relative 03/18/2013 0  0 - 1 % Final  . Basophils Absolute 03/18/2013 0.0  0.0 - 0.1 K/uL Final  . Sodium 03/18/2013 141  135 - 145 mEq/L Final  . Potassium 03/18/2013 3.7  3.5 - 5.1 mEq/L Final  . Chloride 03/18/2013 105  96 - 112 mEq/L Final  . CO2 03/18/2013 29  19 - 32 mEq/L Final  . Glucose, Bld 03/18/2013 98  70 - 99 mg/dL Final  . BUN 47/82/9562 16  6 - 23 mg/dL Final  . Creatinine, Ser 03/18/2013 0.65  0.50 - 1.10 mg/dL Final  . Calcium 13/11/6576 9.5  8.4 - 10.5 mg/dL Final  . Total Protein 03/18/2013 7.6  6.0 - 8.3 g/dL Final  . Albumin 46/96/2952 3.7  3.5 - 5.2 g/dL Final  . AST 84/13/2440 19  0 - 37 U/L Final  . ALT 03/18/2013 12  0 - 35 U/L Final  . Alkaline Phosphatase 03/18/2013 68  39 - 117 U/L Final  . Total Bilirubin 03/18/2013 0.4  0.3 - 1.2 mg/dL Final  . GFR calc non Af Amer 03/18/2013 >90  >90 mL/min Final  . GFR calc Af Amer 03/18/2013 >90  >90 mL/min Final   Comment: (NOTE)                          The eGFR has been calculated using the CKD EPI equation.                          This calculation has not been validated in all clinical situations.                          eGFR's persistently <90 mL/min signify possible Chronic Kidney                          Disease.    PATHOLOGY:  Urinalysis    Component Value Date/Time   COLORURINE YELLOW 12/13/2008 1438   APPEARANCEUR CLEAR 12/13/2008 1438   LABSPEC 1.020 12/13/2008 1438   PHURINE 6.0 12/13/2008 1438   GLUCOSEU NEGATIVE 12/13/2008 1438   HGBUR  NEGATIVE 12/13/2008 1438   BILIRUBINUR NEGATIVE 12/13/2008 1438   KETONESUR NEGATIVE 12/13/2008 1438   PROTEINUR NEGATIVE 12/13/2008 1438   UROBILINOGEN 0.2 12/13/2008 1438   NITRITE NEGATIVE 12/13/2008 1438   LEUKOCYTESUR NEGATIVE MICROSCOPIC NOT DONE ON URINES WITH NEGATIVE PROTEIN, BLOOD, LEUKOCYTES, NITRITE, OR GLUCOSE <1000 mg/dL. 12/13/2008 1438    RADIOGRAPHIC STUDIES: No results found.  ASSESSMENT:  #1.  Invasive ductal neoplasia the left breast, no evidence of disease, status post lumpectomy, radiotherapy, as well as bilateral oophorectomy with refusal to take tamoxifen or an aromatase inhibitor. #2. Iron deficiency, awaiting today's CBC and ferritin. #3. Endometriosis, improved since oophorectomy.   PLAN:  #1. Continue performing monthly self breast examination. #2. Followup mammogram in May of 2015. #3. Call if develops any new problems that are troublesome and persistent. #4. Patient is interested in possible plastic surgery to remove scars and she was advised to contact her PCP for discussion of referral.   All questions were answered. The patient knows to call the clinic with any problems, questions or concerns. We can certainly see the patient much sooner if necessary.   I spent 25 minutes counseling the patient face to face. The total time spent in the appointment was 30 minutes.    Maurilio Lovely, MD 03/18/2013 11:05 AM

## 2013-03-18 NOTE — Patient Instructions (Signed)
[facility] Discharge Instructions  RECOMMENDATIONS MADE BY THE CONSULTANT AND ANY TEST RESULTS WILL BE SENT TO YOUR REFERRING PHYSICIAN.  EXAM FINDINGS BY THE PHYSICIAN TODAY AND SIGNS OR SYMPTOMS TO REPORT TO CLINIC OR PRIMARY PHYSICIAN: Exam and findings as discussed by Dr. Zigmund Daniel.  You are doing well.  Need to get your mammogram in May 2015.  Report any new lumps, bone pain, shortness of breath or other symptoms.  MEDICATIONS PRESCRIBED:  none  INSTRUCTIONS/FOLLOW-UP: Mammogram in May and to be seen in follow-up in 1 year.  Thank you for choosing [facility] to provide your oncology and hematology care.  To afford each patient quality time with our providers, please arrive at least 15 minutes before your scheduled appointment time.  With your help, our goal is to use those 15 minutes to complete the necessary work-up to ensure our physicians have the information they need to help with your evaluation and healthcare recommendations.    Effective January 1st, 2014, we ask that you re-schedule your appointment with our physicians should you arrive 10 or more minutes late for your appointment.  We strive to give you quality time with our providers, and arriving late affects you and other patients whose appointments are after yours.    Again, thank you for choosing [facility]  Our hope is that these requests will decrease the amount of time that you wait before being seen by our physicians.       _____________________________________________________________  Should you have questions after your visit to [facility], please contact our office at [phone] between the hours of 8:30 a.m. and 5:00 p.m.  Voicemails left after 4:30 p.m. will not be returned until the following business day.  For prescription refill requests, have your pharmacy contact our office with your prescription refill request.

## 2013-03-18 NOTE — Progress Notes (Signed)
Veronica Dudley presented for labwork. Labs per MD order drawn via Peripheral Line 23 gauge needle inserted in right AC  Good blood return present. Procedure without incident.  Needle removed intact. Patient tolerated procedure well.   

## 2013-09-30 ENCOUNTER — Ambulatory Visit (HOSPITAL_COMMUNITY)
Admission: RE | Admit: 2013-09-30 | Discharge: 2013-09-30 | Disposition: A | Discharge status: Home / Self Care | Payer: BC Managed Care – PPO | Source: Ambulatory Visit | Attending: Hematology and Oncology | Admitting: Hematology and Oncology

## 2013-09-30 ENCOUNTER — Encounter (HOSPITAL_COMMUNITY): Payer: BC Managed Care – PPO

## 2013-09-30 DIAGNOSIS — D0510 Intraductal carcinoma in situ of unspecified breast: Secondary | ICD-10-CM

## 2013-09-30 DIAGNOSIS — D509 Iron deficiency anemia, unspecified: Secondary | ICD-10-CM

## 2013-09-30 DIAGNOSIS — Z1231 Encounter for screening mammogram for malignant neoplasm of breast: Secondary | ICD-10-CM | POA: Insufficient documentation

## 2013-09-30 DIAGNOSIS — Z853 Personal history of malignant neoplasm of breast: Secondary | ICD-10-CM | POA: Insufficient documentation

## 2014-01-29 ENCOUNTER — Other Ambulatory Visit: Payer: Self-pay | Admitting: Obstetrics & Gynecology

## 2014-02-01 LAB — CYTOLOGY - PAP

## 2014-03-17 ENCOUNTER — Ambulatory Visit (HOSPITAL_COMMUNITY): Payer: BC Managed Care – PPO

## 2014-03-19 ENCOUNTER — Encounter (HOSPITAL_COMMUNITY): Payer: BC Managed Care – PPO | Attending: Oncology | Admitting: Oncology

## 2014-03-19 ENCOUNTER — Encounter (HOSPITAL_COMMUNITY): Payer: Self-pay | Admitting: Oncology

## 2014-03-19 VITALS — BP 107/69 | HR 87 | Temp 98.2°F | Resp 14 | Wt 159.0 lb

## 2014-03-19 DIAGNOSIS — Z90722 Acquired absence of ovaries, bilateral: Secondary | ICD-10-CM

## 2014-03-19 DIAGNOSIS — D509 Iron deficiency anemia, unspecified: Secondary | ICD-10-CM | POA: Diagnosis not present

## 2014-03-19 DIAGNOSIS — Z853 Personal history of malignant neoplasm of breast: Secondary | ICD-10-CM

## 2014-03-19 DIAGNOSIS — D0510 Intraductal carcinoma in situ of unspecified breast: Secondary | ICD-10-CM | POA: Diagnosis present

## 2014-03-19 DIAGNOSIS — Z923 Personal history of irradiation: Secondary | ICD-10-CM

## 2014-03-19 HISTORY — DX: Acquired absence of ovaries, bilateral: Z90.722

## 2014-03-19 LAB — CBC WITH DIFFERENTIAL/PLATELET
Basophils Absolute: 0 10*3/uL (ref 0.0–0.1)
Basophils Relative: 1 % (ref 0–1)
EOS ABS: 0.2 10*3/uL (ref 0.0–0.7)
Eosinophils Relative: 4 % (ref 0–5)
HCT: 35.5 % — ABNORMAL LOW (ref 36.0–46.0)
Hemoglobin: 11.5 g/dL — ABNORMAL LOW (ref 12.0–15.0)
LYMPHS ABS: 2 10*3/uL (ref 0.7–4.0)
Lymphocytes Relative: 44 % (ref 12–46)
MCH: 27.9 pg (ref 26.0–34.0)
MCHC: 32.4 g/dL (ref 30.0–36.0)
MCV: 86.2 fL (ref 78.0–100.0)
Monocytes Absolute: 0.2 10*3/uL (ref 0.1–1.0)
Monocytes Relative: 4 % (ref 3–12)
NEUTROS PCT: 47 % (ref 43–77)
Neutro Abs: 2.1 10*3/uL (ref 1.7–7.7)
Platelets: 208 10*3/uL (ref 150–400)
RBC: 4.12 MIL/uL (ref 3.87–5.11)
RDW: 12.8 % (ref 11.5–15.5)
WBC: 4.5 10*3/uL (ref 4.0–10.5)

## 2014-03-19 LAB — COMPREHENSIVE METABOLIC PANEL
ALK PHOS: 73 U/L (ref 39–117)
ALT: 12 U/L (ref 0–35)
AST: 19 U/L (ref 0–37)
Albumin: 4.1 g/dL (ref 3.5–5.2)
Anion gap: 12 (ref 5–15)
BUN: 15 mg/dL (ref 6–23)
CO2: 27 mEq/L (ref 19–32)
Calcium: 9.5 mg/dL (ref 8.4–10.5)
Chloride: 104 mEq/L (ref 96–112)
Creatinine, Ser: 0.65 mg/dL (ref 0.50–1.10)
GFR calc Af Amer: >90 mL/min (ref >90)
GFR calc non Af Amer: >90 mL/min (ref >90)
GLUCOSE: 91 mg/dL (ref 70–99)
POTASSIUM: 3.9 meq/L (ref 3.7–5.3)
SODIUM: 143 meq/L (ref 137–147)
TOTAL PROTEIN: 7.9 g/dL (ref 6.0–8.3)
Total Bilirubin: 0.3 mg/dL (ref 0.3–1.2)

## 2014-03-19 LAB — IRON AND TIBC
Iron: 91 ug/dL (ref 42–135)
SATURATION RATIOS: 38 % (ref 20–55)
TIBC: 238 ug/dL — ABNORMAL LOW (ref 250–470)
UIBC: 147 ug/dL (ref 125–400)

## 2014-03-19 NOTE — Progress Notes (Addendum)
Veronica Kilts, MD [place] [address]  Ductal carcinoma in situ of breast, unspecified laterality  CURRENT THERAPY: Surveillance per NCCN guidelines  INTERVAL HISTORY: [name] 48 y.o. female returns for  regular  visit for followup of noninvasive ductal neoplasia of the breast, status post lumpectomy on no additional therapy (refused by patient) systemically but did receive radiotherapy.   I personally reviewed and went over laboratory results with the patient.  The results are noted within this dictation.  I personally reviewed and went over radiographic studies with the patient.  The results are noted within this dictation.  Mammogram in June 2015 was BIRADS 2.  She will be due again in June 2016 for her next mammogram.  She is doing very well from a breast cancer standpoint. She reports that she like to be referred to a plastic surgeon for consideration of options regarding breast reconstruction. I will refer her to Dr. Theodoro Kos.  She number of questions regarding breast cancer surveillance and prevention in the future. In the past, she was offered tamoxifen therapy for her ER-positive DCIS. She declined in the past. She reports that she was fearful of the VTE risk. She reports that she's had a bilateral oophorectomy and therefore she is officially surgically postmenopausal. I would not offer her tamoxifen as a result of this as being no aromatase inhibitors are superior to tamoxifen. I have reviewed the NCCN guidelines and aromatase inhibition for ER positive DCIS is recommended in postmenopausal patients. I spent a significant amount time discussing this with the patient. We discussed the risks (increased risk for osteoporosis, secondary malignancy), benefits (breast cancer prevention), alternatives (continue with surveillance), side effects including hot flashes, arthralgias, and myalgias. She would like to consider this option and I provide her  information in her discharge instructions regarding anastrozole therapy.  NCCN guidelines recommends the following for DCIS postsurgical treatment:  A. Risk reduction therapy for ipsilateral breast-conserving surgery:   1. Consider endocrine therapy for 5 years:    A. Patient treated with breast-conserving therapy (lumpectomy) and radiation therapy (category 1), especially for those with ER-positive DCIS    B. The benefit of endocrine therapy for ER-negative DCIS is uncertain    C. Patients treated with excision alone   2. Endocrine therapy:    A. Tamoxifen for premenopausal patients    B. Tamoxifen or aromatase inhibitor for postmenopausal patients with some advantage for aromatase inhibitor therapy in patients < 40 years old or with concerns for thromboembolism  B. Risk reduction therapy for contralateral breast   1. Counseling regarding risk reduction.  Oncologically, the patient denies any complaints and our was questioning is negative.  Past Medical History  Diagnosis Date  . Cancer     lt breast ca 2010/surg-lumpectomy/rad tx  . Breast cancer 6/10    DCIS  . Endometriosis   . Iron deficiency anemia   . Breast CA 09/07/2011  . Iron deficiency anemia 09/07/2011    has Ductal carcinoma in situ of breast and Iron deficiency anemia on her problem list.     has No Known Allergies.  Veronica Dudley does not currently have medications on file.  Past Surgical History  Procedure Laterality Date  . Cholecystectomy    . Cesarean section    . Lumpectomy      left  . Bilateral oophorectomy Bilateral     Denies any headaches, dizziness, double vision, fevers, chills, night sweats, nausea, vomiting, diarrhea, constipation, chest pain, heart  palpitations, shortness of breath, blood in stool, black tarry stool, urinary pain, urinary burning, urinary frequency, hematuria.   PHYSICAL EXAMINATION  ECOG PERFORMANCE STATUS: 0 - Asymptomatic  Filed Vitals:   03/19/14 1435  BP: 107/69    Pulse: 87  Temp: 98.2 F (36.8 C)  Resp: 14    GENERAL:alert, healthy, no distress, well nourished, well developed, comfortable, cooperative and smiling SKIN: skin color, texture, turgor are normal, no rashes or significant lesions HEAD: Normocephalic, No masses, lesions, tenderness or abnormalities EYES: normal, PERRLA, EOMI, Conjunctiva are pink and non-injected EARS: External ears normal OROPHARYNX:lips, buccal mucosa, and tongue normal and mucous membranes are moist  NECK: supple, no adenopathy, thyroid normal size, non-tender, without nodularity, no stridor, non-tender, trachea midline LYMPH:  no palpable lymphadenopathy, no hepatosplenomegaly BREAST:breasts appear normal, no suspicious masses, no skin or nipple changes or axillary nodes LUNGS: clear to auscultation and percussion HEART: regular rate & rhythm, no murmurs, no gallops, S1 normal and S2 normal ABDOMEN:abdomen soft, non-tender, obese, normal bowel sounds, no masses or organomegaly and no hepatosplenomegaly BACK: Back symmetric, no curvature., No CVA tenderness EXTREMITIES:less then 2 second capillary refill, no joint deformities, effusion, or inflammation, no edema, no skin discoloration, no clubbing, no cyanosis  NEURO: alert & oriented x 3 with fluent speech, no focal motor/sensory deficits, gait normal   LABORATORY DATA: CBC    Component Value Date/Time   WBC 3.4* 03/18/2013 0927   RBC 3.97 03/18/2013 0927   HGB 11.0* 03/18/2013 0927   HCT 34.5* 03/18/2013 0927   PLT 222 03/18/2013 0927   MCV 86.9 03/18/2013 0927   MCH 27.7 03/18/2013 0927   MCHC 31.9 03/18/2013 0927   RDW 13.2 03/18/2013 0927   LYMPHSABS 1.3 03/18/2013 0927   MONOABS 0.2 03/18/2013 0927   EOSABS 0.1 03/18/2013 0927   BASOSABS 0.0 03/18/2013 0927      Chemistry      Component Value Date/Time   NA 141 03/18/2013 0927   K 3.7 03/18/2013 0927   CL 105 03/18/2013 0927   CO2 29 03/18/2013 0927   BUN 16 03/18/2013 0927   CREATININE  0.65 03/18/2013 0927      Component Value Date/Time   CALCIUM 9.5 03/18/2013 0927   ALKPHOS 68 03/18/2013 0927   AST 19 03/18/2013 0927   ALT 12 03/18/2013 0927   BILITOT 0.4 03/18/2013 0927     Lab Results  Component Value Date   FERRITIN 27 03/18/2013     RADIOGRAPHIC STUDIES:  09/30/2013  CLINICAL DATA: 48 year old female with prior history of left with cancer post lumpectomy in 2010.  EXAM: DIGITAL DIAGNOSTIC BILATERAL MAMMOGRAM WITH CAD  COMPARISON: Mammograms from 09/24/2012, 09/19/2011, 09/15/2010, 09/07/2009, 10/07/2008.  ACR Breast Density Category c: The breast tissue is heterogeneously dense, which may obscure small masses.  FINDINGS: No suspicious masses or calcifications are seen in either breast. Postsurgical changes are again seen in the left breast from prior lumpectomy. A spot compression magnification tangential view of the lumpectomy site in the left breast was performed. There is no mammographic evidence of locally recurrent malignancy in the left breast.  Mammographic images were processed with CAD.  IMPRESSION: No mammographic evidence of malignancy in either breast.  RECOMMENDATION: Diagnostic mammogram is suggested in 1 year. (Code:DM-B-01Y)  I have discussed the findings and recommendations with the patient. Results were also provided in writing at the conclusion of the visit. If applicable, a reminder letter will be sent to the patient regarding the next appointment.  BI-RADS CATEGORY 2:  Benign.   Electronically Signed  By: Everlean Alstrom M.D.  On: 09/30/2013 08:45      ASSESSMENT:  1. Invasive ductal neoplasia the left breast, no evidence of disease, status post lumpectomy, radiotherapy, as well as bilateral oophorectomy with refusal to take tamoxifen or an aromatase inhibitor. 2. Iron deficiency, awaiting today's CBC and ferritin. 3. Endometriosis, improved since oophorectomy.  Patient Active Problem List     Diagnosis Date Noted  . Ductal carcinoma in situ of breast 09/07/2011  . Iron deficiency anemia 09/07/2011     PLAN:  1. I personally reviewed and went over laboratory results with the patient.  The results are noted within this dictation. 2. I personally reviewed and went over radiographic studies with the patient.  The results are noted within this dictation.   3. Next screening mammogram is due in June 2016.  4. Labs today: CBC diff, CMET, iron/TIBC, ferritin 5. Patient education regarding Anastrozole.  If she decides to pursue this option, she will need a baseline bone density exam. 6. Referral to Dr. Theodoro Kos for breast reconstruction options. 7. Return in 12 months for follow-up   THERAPY PLAN:  NCCN guidelines recommends the following surveillance for DCIS of breast:  1. Interval history and physical exam every 6-12 months for 5 years, then annually.  2. Mammogram every 12 months (and 6-12 months postradiation therapy if breast conserved [Category 2B]).  3. If treated with Tamoxifen, monitor per NCCN guidelines for Breast Cancer Risk Reduction.  NCCN guidelines recommends the following for DCIS postsurgical treatment:  A. Risk reduction therapy for ipsilateral breast-conserving surgery:   1. Consider endocrine therapy for 5 years:    A. Patient treated with breast-conserving therapy (lumpectomy) and radiation therapy (category 1), especially for those with ER-positive DCIS    B. The benefit of endocrine therapy for ER-negative DCIS is uncertain    C. Patients treated with excision alone   2. Endocrine therapy:    A. Tamoxifen for premenopausal patients    B. Tamoxifen or aromatase inhibitor for postmenopausal patients with some advantage for aromatase inhibitor therapy in patients < 38 years old or with concerns for thromboembolism  B. Risk reduction therapy for contralateral breast   1. Counseling regarding risk reduction.   All questions were answered. The patient  knows to call the clinic with any problems, questions or concerns. We can certainly see the patient much sooner if necessary.  Patient and plan discussed with Dr. Farrel Gobble and he is in agreement with the aforementioned.   45 minutes spent in face-to-face office visit.  More than 50% of the time spent with the patient was utilized for counseling and coordination of care.   KEFALAS,THOMAS 03/19/2014

## 2014-03-19 NOTE — Patient Instructions (Signed)
[place] Discharge Instructions  RECOMMENDATIONS MADE BY THE CONSULTANT AND ANY TEST RESULTS WILL BE SENT TO YOUR REFERRING PHYSICIAN.  We will perform labs in 12 months.  We will perform labs today. We will refer you Dr. Theodoro Kos for consideration of breast reconstructive surgery. We had a long discussion regarding aromatase inhibitor, Anastrozole. Follow-up with [place] in 12 months.  Thank you for choosing [facility] to provide your oncology and hematology care.  To afford each patient quality time with our providers, please arrive at least 15 minutes before your scheduled appointment time.  With your help, our goal is to use those 15 minutes to complete the necessary work-up to ensure our physicians have the information they need to help with your evaluation and healthcare recommendations.    Effective January 1st, 2014, we ask that you re-schedule your appointment with our physicians should you arrive 10 or more minutes late for your appointment.  We strive to give you quality time with our providers, and arriving late affects you and other patients whose appointments are after yours.    Again, thank you for choosing [facility].  Our hope is that these requests will decrease the amount of time that you wait before being seen by our physicians.       _____________________________________________________________  Should you have questions after your visit to [facility], please contact our office at [phone] between the hours of 8:30 a.m. and 5:00 p.m.  Voicemails left after 4:30 p.m. will not be returned until the following business day.  For prescription refill requests, have your pharmacy contact our office with your prescription refill request.     Anastrozole tablets What is this medicine? ANASTROZOLE (an AS troe zole) is used to treat breast cancer in women who have gone through menopause. Some types of breast  cancer depend on estrogen to grow, and this medicine can stop tumor growth by blocking estrogen production. This medicine may be used for other purposes; ask your health care provider or pharmacist if you have questions. COMMON BRAND NAME(S): Arimidex What should I tell my health care provider before I take this medicine? They need to know if you have any of these conditions: -liver disease -an unusual or allergic reaction to anastrozole, other medicines, foods, dyes, or preservatives -pregnant or trying to get pregnant -breast-feeding How should I use this medicine? Take this medicine by mouth with a glass of water. Follow the directions on the prescription label. You can take this medicine with or without food. Take your doses at regular intervals. Do not take your medicine more often than directed. Do not stop taking except on the advice of your doctor or health care professional. Talk to your pediatrician regarding the use of this medicine in children. Special care may be needed. Overdosage: If you think you have taken too much of this medicine contact a poison control center or emergency room at once. NOTE: This medicine is only for you. Do not share this medicine with others. What if I miss a dose? If you miss a dose, take it as soon as you can. If it is almost time for your next dose, take only that dose. Do not take double or extra doses. What may interact with this medicine? Do not take this medicine with any of the following medications: -female hormones, like estrogens or progestins and birth control pills This medicine may also interact with the following medications: -tamoxifen This  list may not describe all possible interactions. Give your health care provider a list of all the medicines, herbs, non-prescription drugs, or dietary supplements you use. Also tell them if you smoke, drink alcohol, or use illegal drugs. Some items may interact with your medicine. What should I watch  for while using this medicine? Visit your doctor or health care professional for regular checks on your progress. Let your doctor or health care professional know about any unusual vaginal bleeding. Do not treat yourself for diarrhea, nausea, vomiting or other side effects. Ask your doctor or health care professional for advice. What side effects may I notice from receiving this medicine? Side effects that you should report to your doctor or health care professional as soon as possible: -allergic reactions like skin rash, itching or hives, swelling of the face, lips, or tongue -any new or unusual symptoms -breathing problems -chest pain -leg pain or swelling -vomiting Side effects that usually do not require medical attention (report to your doctor or health care professional if they continue or are bothersome): -back or bone pain -cough, or throat infection -diarrhea or constipation -dizziness -headache -hot flashes -loss of appetite -nausea -sweating -weakness and tiredness -weight gain This list may not describe all possible side effects. Call your doctor for medical advice about side effects. You may report side effects to FDA at 1-800-FDA-1088. Where should I keep my medicine? Keep out of the reach of children. Store at room temperature between 20 and 25 degrees C (68 and 77 degrees F). Throw away any unused medicine after the expiration date. NOTE: This sheet is a summary. It may not cover all possible information. If you have questions about this medicine, talk to your doctor, pharmacist, or health care provider.  2015, Elsevier/Gold Standard. (2007-06-27 16:31:52)

## 2014-03-19 NOTE — Progress Notes (Signed)
[place] presented for [employer]. Labs per MD order drawn via Peripheral Line 23 gauge needle inserted in right AC  Good blood return present. Procedure without incident.  Needle removed intact. Patient tolerated procedure well.

## 2014-03-20 LAB — FERRITIN: FERRITIN: 57 ng/mL (ref 10–291)

## 2014-03-22 ENCOUNTER — Encounter (HOSPITAL_COMMUNITY): Payer: Self-pay | Admitting: Lab

## 2014-03-22 NOTE — Progress Notes (Signed)
Referral sent to Dr Migdalia Dk.  Appointment is Nov 25 at 11:15.  Records are faxed to office.

## 2014-03-24 DIAGNOSIS — C50912 Malignant neoplasm of unspecified site of left female breast: Secondary | ICD-10-CM | POA: Insufficient documentation

## 2014-05-06 ENCOUNTER — Encounter (HOSPITAL_BASED_OUTPATIENT_CLINIC_OR_DEPARTMENT_OTHER): Payer: Self-pay | Admitting: *Deleted

## 2014-05-06 NOTE — Progress Notes (Signed)
No labs needed

## 2014-05-07 ENCOUNTER — Other Ambulatory Visit: Payer: Self-pay | Admitting: Plastic Surgery

## 2014-05-07 DIAGNOSIS — N6489 Other specified disorders of breast: Secondary | ICD-10-CM

## 2014-05-07 NOTE — H&P (Addendum)
Veronica Dudley is an 49 y.o. female.   Chief Complaint: breast asymmetry HPI: The patient is a 49 yrs old female here for a history and physical for breast reconstruction. She was diagnosed with ductal carcinoma in situ of the left breast, intermediate grade with 2.7 cm lesion close to the chest wall but 2 mm negative margin was obtained (08/2008). It was ER positive and PR negative. She has finished radiation (12/2008). She decided not to take the tamoxifen. She had a hysterectomy, cholecystectomy, and a c-section. She quit smoking several years ago. She is 5 feet 8 inches, weighs 154, wears a 36C bra. Her last mammogram was 6 months ago and was benign. She has an axillary scar, upper pole scar and areola scar at the superior aspect. All are well healed. She would like to more symmetric.  Past Medical History  Diagnosis Date  . Cancer     lt breast ca 2010/surg-lumpectomy/rad tx  . Breast cancer 6/10    DCIS  . Endometriosis   . Iron deficiency anemia   . Breast CA 09/07/2011  . Iron deficiency anemia 09/07/2011  . H/O bilateral oophorectomy 03/19/2014    Past Surgical History  Procedure Laterality Date  . Cesarean section    . Lumpectomy  2010    left lump-snbx  . Bilateral oophorectomy Bilateral 2010  . Cholecystectomy  2005    lap choli  . Abdominal hysterectomy  2010  . Diagnostic laparoscopy  2004    incisional mass  . Laparoscopy  2006    LOA    Family History  Problem Relation Age of Onset  . Heart attack Father   . Cancer Maternal Aunt     ovarian cancer   Social History:  reports that she quit smoking about 18 years ago. She has never used smokeless tobacco. She reports that she drinks about 1.2 oz of alcohol per week. She reports that she does not use illicit drugs.  Allergies: No Known Allergies   (Not in a hospital admission)  No results found for this or any previous visit (from the past 48 hour(s)). No results found.  Review of Systems  Constitutional:  Negative.   HENT: Negative.   Eyes: Negative.   Respiratory: Negative.   Cardiovascular: Negative.   Gastrointestinal: Negative.   Genitourinary: Negative.   Musculoskeletal: Negative.   Skin: Negative.   Neurological: Negative.   Psychiatric/Behavioral: Negative.     There were no vitals taken for this visit. Physical Exam  Constitutional: She is oriented to person, place, and time. She appears well-developed and well-nourished.  HENT:  Head: Normocephalic and atraumatic.  Eyes: Conjunctivae are normal. Pupils are equal, round, and reactive to light.  Cardiovascular: Normal rate.   Respiratory: Effort normal.  GI: Soft. She exhibits no distension.  Musculoskeletal: Normal range of motion.  Neurological: She is alert and oriented to person, place, and time.  Skin: Skin is warm.  Psychiatric: She has a normal mood and affect. Her behavior is normal. Judgment and thought content normal.     Assessment/Plan Once all reconstruction options were presented, a focused discussion was had regarding the patient's suitability for procedures. Recommend fat grafting of the left breast for symmetry.       SANGER,Crissa Sowder 05/07/2014, 12:46 PM

## 2014-05-13 ENCOUNTER — Ambulatory Visit (HOSPITAL_BASED_OUTPATIENT_CLINIC_OR_DEPARTMENT_OTHER): Payer: BLUE CROSS/BLUE SHIELD | Admitting: Anesthesiology

## 2014-05-13 ENCOUNTER — Encounter (HOSPITAL_BASED_OUTPATIENT_CLINIC_OR_DEPARTMENT_OTHER): Payer: Self-pay | Admitting: *Deleted

## 2014-05-13 ENCOUNTER — Ambulatory Visit (HOSPITAL_BASED_OUTPATIENT_CLINIC_OR_DEPARTMENT_OTHER)
Admission: RE | Admit: 2014-05-13 | Discharge: 2014-05-13 | Disposition: A | Discharge status: Home / Self Care | Payer: BLUE CROSS/BLUE SHIELD | Source: Ambulatory Visit | Attending: Plastic Surgery | Admitting: Plastic Surgery

## 2014-05-13 ENCOUNTER — Encounter (HOSPITAL_BASED_OUTPATIENT_CLINIC_OR_DEPARTMENT_OTHER)
Admission: RE | Disposition: A | Discharge status: Home / Self Care | Payer: Self-pay | Source: Ambulatory Visit | Attending: Plastic Surgery

## 2014-05-13 DIAGNOSIS — N6489 Other specified disorders of breast: Secondary | ICD-10-CM

## 2014-05-13 DIAGNOSIS — N651 Disproportion of reconstructed breast: Secondary | ICD-10-CM | POA: Diagnosis present

## 2014-05-13 DIAGNOSIS — Z853 Personal history of malignant neoplasm of breast: Secondary | ICD-10-CM | POA: Diagnosis present

## 2014-05-13 DIAGNOSIS — Z87891 Personal history of nicotine dependence: Secondary | ICD-10-CM | POA: Insufficient documentation

## 2014-05-13 DIAGNOSIS — D509 Iron deficiency anemia, unspecified: Secondary | ICD-10-CM | POA: Insufficient documentation

## 2014-05-13 DIAGNOSIS — N809 Endometriosis, unspecified: Secondary | ICD-10-CM | POA: Diagnosis not present

## 2014-05-13 HISTORY — PX: LIPOSUCTION WITH LIPOFILLING: SHX6436

## 2014-05-13 HISTORY — DX: Nausea with vomiting, unspecified: R11.2

## 2014-05-13 HISTORY — DX: Other specified postprocedural states: Z98.890

## 2014-05-13 LAB — POCT HEMOGLOBIN-HEMACUE: Hemoglobin: 10.9 g/dL — ABNORMAL LOW (ref 12.0–15.0)

## 2014-05-13 SURGERY — LIPOSUCTION, WITH FAT TRANSFER
Anesthesia: General | Site: Breast | Laterality: Left

## 2014-05-13 MED ORDER — SUFENTANIL CITRATE 50 MCG/ML IV SOLN
INTRAVENOUS | Status: AC
  Filled 2014-05-13: qty 1

## 2014-05-13 MED ORDER — MIDAZOLAM HCL 2 MG/2ML IJ SOLN: 1.0000 mg | INTRAMUSCULAR | Status: DC | PRN

## 2014-05-13 MED ORDER — OXYCODONE HCL 5 MG PO TABS
ORAL_TABLET | ORAL | Status: AC
  Filled 2014-05-13: qty 1

## 2014-05-13 MED ORDER — HYDROMORPHONE HCL 1 MG/ML IJ SOLN
0.2500 mg | INTRAMUSCULAR | Status: DC | PRN
  Administered 2014-05-13: 0.25 mg via INTRAVENOUS

## 2014-05-13 MED ORDER — SUFENTANIL CITRATE 50 MCG/ML IV SOLN
INTRAVENOUS | Status: DC | PRN
  Administered 2014-05-13: 5 ug via INTRAVENOUS
  Administered 2014-05-13: 10 ug via INTRAVENOUS

## 2014-05-13 MED ORDER — LIDOCAINE-EPINEPHRINE 1 %-1:100000 IJ SOLN
INTRAMUSCULAR | Status: AC
  Filled 2014-05-13: qty 1

## 2014-05-13 MED ORDER — ONDANSETRON HCL 4 MG/2ML IJ SOLN
INTRAMUSCULAR | Status: DC | PRN
  Administered 2014-05-13: 4 mg via INTRAVENOUS

## 2014-05-13 MED ORDER — FENTANYL CITRATE 0.05 MG/ML IJ SOLN: 50.0000 ug | INTRAMUSCULAR | Status: DC | PRN

## 2014-05-13 MED ORDER — ONDANSETRON HCL 4 MG PO TABS: 4.0000 mg | ORAL_TABLET | Freq: Three times a day (TID) | ORAL | Status: DC | PRN

## 2014-05-13 MED ORDER — MIDAZOLAM HCL 5 MG/5ML IJ SOLN
INTRAMUSCULAR | Status: DC | PRN
  Administered 2014-05-13: 2 mg via INTRAVENOUS

## 2014-05-13 MED ORDER — SCOPOLAMINE 1 MG/3DAYS TD PT72
1.0000 | MEDICATED_PATCH | TRANSDERMAL | Status: DC
  Administered 2014-05-13: 1.5 mg via TRANSDERMAL

## 2014-05-13 MED ORDER — MIDAZOLAM HCL 2 MG/2ML IJ SOLN
INTRAMUSCULAR | Status: AC
Start: 2014-05-13 — End: 2014-05-13
  Filled 2014-05-13: qty 2

## 2014-05-13 MED ORDER — SUCCINYLCHOLINE CHLORIDE 20 MG/ML IJ SOLN
INTRAMUSCULAR | Status: AC
  Filled 2014-05-13: qty 1

## 2014-05-13 MED ORDER — BUPIVACAINE-EPINEPHRINE 0.25% -1:200000 IJ SOLN
INTRAMUSCULAR | Status: DC | PRN
  Administered 2014-05-13: 8 mL

## 2014-05-13 MED ORDER — DEXAMETHASONE SODIUM PHOSPHATE 4 MG/ML IJ SOLN
INTRAMUSCULAR | Status: DC | PRN
  Administered 2014-05-13: 10 mg via INTRAVENOUS

## 2014-05-13 MED ORDER — ONDANSETRON HCL 4 MG/2ML IJ SOLN: 4.0000 mg | Freq: Once | INTRAMUSCULAR | Status: DC | PRN

## 2014-05-13 MED ORDER — PROPOFOL 10 MG/ML IV EMUL
INTRAVENOUS | Status: AC
  Filled 2014-05-13: qty 50

## 2014-05-13 MED ORDER — OXYCODONE HCL 5 MG/5ML PO SOLN: 5.0000 mg | Freq: Once | ORAL | Status: AC | PRN

## 2014-05-13 MED ORDER — EPINEPHRINE HCL 1 MG/ML IJ SOLN
INTRAMUSCULAR | Status: AC
  Filled 2014-05-13: qty 1

## 2014-05-13 MED ORDER — BUPIVACAINE-EPINEPHRINE (PF) 0.25% -1:200000 IJ SOLN
INTRAMUSCULAR | Status: AC
  Filled 2014-05-13: qty 30

## 2014-05-13 MED ORDER — PROPOFOL 10 MG/ML IV BOLUS
INTRAVENOUS | Status: DC | PRN
  Administered 2014-05-13: 300 mg via INTRAVENOUS

## 2014-05-13 MED ORDER — CEPHALEXIN 500 MG PO CAPS: 500.0000 mg | ORAL_CAPSULE | Freq: Four times a day (QID) | ORAL | Status: DC

## 2014-05-13 MED ORDER — PHENYLEPHRINE HCL 10 MG/ML IJ SOLN
INTRAMUSCULAR | Status: DC | PRN
  Administered 2014-05-13: 40 ug via INTRAVENOUS

## 2014-05-13 MED ORDER — SCOPOLAMINE 1 MG/3DAYS TD PT72
MEDICATED_PATCH | TRANSDERMAL | Status: AC
  Filled 2014-05-13: qty 1

## 2014-05-13 MED ORDER — CEFAZOLIN SODIUM-DEXTROSE 2-3 GM-% IV SOLR
INTRAVENOUS | Status: AC
  Filled 2014-05-13: qty 50

## 2014-05-13 MED ORDER — CEFAZOLIN SODIUM-DEXTROSE 2-3 GM-% IV SOLR
2.0000 g | INTRAVENOUS | Status: AC
  Administered 2014-05-13: 2 g via INTRAVENOUS

## 2014-05-13 MED ORDER — OXYCODONE HCL 5 MG PO TABS
5.0000 mg | ORAL_TABLET | Freq: Once | ORAL | Status: AC | PRN
  Administered 2014-05-13: 5 mg via ORAL

## 2014-05-13 MED ORDER — LIDOCAINE HCL (PF) 1 % IJ SOLN
INTRAMUSCULAR | Status: AC
  Filled 2014-05-13: qty 60

## 2014-05-13 MED ORDER — LIDOCAINE HCL (CARDIAC) 20 MG/ML IV SOLN
INTRAVENOUS | Status: DC | PRN
  Administered 2014-05-13: 50 mg via INTRAVENOUS

## 2014-05-13 MED ORDER — HYDROMORPHONE HCL 1 MG/ML IJ SOLN
INTRAMUSCULAR | Status: AC
  Filled 2014-05-13: qty 1

## 2014-05-13 MED ORDER — SODIUM BICARBONATE 4 % IV SOLN
INTRAVENOUS | Status: DC | PRN
  Administered 2014-05-13: 400 mL via INTRAMUSCULAR

## 2014-05-13 MED ORDER — EPHEDRINE SULFATE 50 MG/ML IJ SOLN
INTRAMUSCULAR | Status: DC | PRN
  Administered 2014-05-13: 15 mg via INTRAVENOUS

## 2014-05-13 MED ORDER — LACTATED RINGERS IV SOLN
INTRAVENOUS | Status: DC
  Administered 2014-05-13: 07:00:00 via INTRAVENOUS

## 2014-05-13 SURGICAL SUPPLY — 68 items
BAG DECANTER FOR FLEXI CONT (MISCELLANEOUS) IMPLANT
BINDER ABDOMINAL 10 UNV 27-48 (MISCELLANEOUS) ×2 IMPLANT
BINDER BREAST LRG (GAUZE/BANDAGES/DRESSINGS) IMPLANT
BINDER BREAST MEDIUM (GAUZE/BANDAGES/DRESSINGS) ×2 IMPLANT
BINDER BREAST XLRG (GAUZE/BANDAGES/DRESSINGS) IMPLANT
BINDER BREAST XXLRG (GAUZE/BANDAGES/DRESSINGS) IMPLANT
BIOPATCH RED 1 DISK 7.0 (GAUZE/BANDAGES/DRESSINGS) IMPLANT
BLADE HEX COATED 2.75 (ELECTRODE) ×2 IMPLANT
BLADE SURG 15 STRL LF DISP TIS (BLADE) ×2 IMPLANT
BLADE SURG 15 STRL SS (BLADE) ×2
BNDG GAUZE ELAST 4 BULKY (GAUZE/BANDAGES/DRESSINGS) ×4 IMPLANT
CANISTER LIPO FAT HARVEST (MISCELLANEOUS) ×2 IMPLANT
CANISTER SUCT 1200ML W/VALVE (MISCELLANEOUS) IMPLANT
CHLORAPREP W/TINT 26ML (MISCELLANEOUS) ×2 IMPLANT
COVER BACK TABLE 60X90IN (DRAPES) ×2 IMPLANT
COVER MAYO STAND STRL (DRAPES) ×2 IMPLANT
DECANTER SPIKE VIAL GLASS SM (MISCELLANEOUS) IMPLANT
DRAIN CHANNEL 19F RND (DRAIN) IMPLANT
DRAPE LAPAROSCOPIC ABDOMINAL (DRAPES) ×2 IMPLANT
DRSG PAD ABDOMINAL 8X10 ST (GAUZE/BANDAGES/DRESSINGS) ×4 IMPLANT
ELECT BLADE 4.0 EZ CLEAN MEGAD (MISCELLANEOUS) ×2
ELECT BLADE 6.5 .24CM SHAFT (ELECTRODE) IMPLANT
ELECT REM PT RETURN 9FT ADLT (ELECTROSURGICAL) ×2
ELECTRODE BLDE 4.0 EZ CLN MEGD (MISCELLANEOUS) ×1 IMPLANT
ELECTRODE REM PT RTRN 9FT ADLT (ELECTROSURGICAL) ×1 IMPLANT
EVACUATOR SILICONE 100CC (DRAIN) IMPLANT
FILTER LIPOSUCTION (MISCELLANEOUS) ×2 IMPLANT
GLOVE BIO SURGEON STRL SZ 6.5 (GLOVE) ×8 IMPLANT
GOWN STRL REUS W/ TWL LRG LVL3 (GOWN DISPOSABLE) ×4 IMPLANT
GOWN STRL REUS W/TWL LRG LVL3 (GOWN DISPOSABLE) ×4
IV NS 500ML (IV SOLUTION)
IV NS 500ML BAXH (IV SOLUTION) IMPLANT
KIT FILL SYSTEM UNIVERSAL (SET/KITS/TRAYS/PACK) ×2 IMPLANT
LINER CANISTER 1000CC FLEX (MISCELLANEOUS) ×2 IMPLANT
LIQUID BAND (GAUZE/BANDAGES/DRESSINGS) ×2 IMPLANT
NDL SAFETY ECLIPSE 18X1.5 (NEEDLE) ×1 IMPLANT
NEEDLE HYPO 18GX1.5 SHARP (NEEDLE) ×1
NEEDLE HYPO 25X1 1.5 SAFETY (NEEDLE) ×2 IMPLANT
NS IRRIG 1000ML POUR BTL (IV SOLUTION) ×2 IMPLANT
PACK BASIN DAY SURGERY FS (CUSTOM PROCEDURE TRAY) ×2 IMPLANT
PAD ALCOHOL SWAB (MISCELLANEOUS) ×2 IMPLANT
PENCIL BUTTON HOLSTER BLD 10FT (ELECTRODE) ×2 IMPLANT
PIN SAFETY STERILE (MISCELLANEOUS) IMPLANT
SLEEVE SCD COMPRESS KNEE MED (MISCELLANEOUS) ×2 IMPLANT
SPONGE GAUZE 4X4 12PLY STER LF (GAUZE/BANDAGES/DRESSINGS) IMPLANT
SPONGE LAP 18X18 X RAY DECT (DISPOSABLE) ×4 IMPLANT
SUT MNCRL AB 4-0 PS2 18 (SUTURE) ×2 IMPLANT
SUT MON AB 3-0 SH 27 (SUTURE) ×1
SUT MON AB 3-0 SH27 (SUTURE) ×1 IMPLANT
SUT MON AB 5-0 PS2 18 (SUTURE) ×4 IMPLANT
SUT PDS 3-0 CT2 (SUTURE)
SUT PDS AB 2-0 CT2 27 (SUTURE) IMPLANT
SUT PDS II 3-0 CT2 27 ABS (SUTURE) IMPLANT
SUT SILK 3 0 PS 1 (SUTURE) IMPLANT
SUT VIC AB 3-0 SH 27 (SUTURE)
SUT VIC AB 3-0 SH 27X BRD (SUTURE) IMPLANT
SUT VICRYL 4-0 PS2 18IN ABS (SUTURE) IMPLANT
SYR 20CC LL (SYRINGE) IMPLANT
SYR 50ML LL SCALE MARK (SYRINGE) ×8 IMPLANT
SYR BULB IRRIGATION 50ML (SYRINGE) ×2 IMPLANT
SYR CONTROL 10ML LL (SYRINGE) ×2 IMPLANT
SYR TB 1ML LL NO SAFETY (SYRINGE) IMPLANT
TOWEL OR 17X24 6PK STRL BLUE (TOWEL DISPOSABLE) ×4 IMPLANT
TUBE CONNECTING 20X1/4 (TUBING) ×2 IMPLANT
TUBING INFILTRATION IT-10001 (TUBING) IMPLANT
TUBING SET GRADUATE ASPIR 12FT (MISCELLANEOUS) ×2 IMPLANT
UNDERPAD 30X30 INCONTINENT (UNDERPADS AND DIAPERS) ×4 IMPLANT
YANKAUER SUCT BULB TIP NO VENT (SUCTIONS) ×2 IMPLANT

## 2014-05-13 NOTE — Anesthesia Procedure Notes (Signed)
Procedure Name: LMA Insertion Date/Time: 05/13/2014 7:53 AM Performed by: Melynda Ripple D Pre-anesthesia Checklist: Patient identified, Emergency Drugs available, Suction available and Patient being monitored Patient Re-evaluated:Patient Re-evaluated prior to inductionOxygen Delivery Method: Circle System Utilized Preoxygenation: Pre-oxygenation with 100% oxygen Intubation Type: IV induction Ventilation: Mask ventilation without difficulty LMA: LMA inserted LMA Size: 4.0 Number of attempts: 2 Airway Equipment and Method: Bite block Placement Confirmation: positive ETCO2 Tube secured with: Tape Dental Injury: Teeth and Oropharynx as per pre-operative assessment

## 2014-05-13 NOTE — Brief Op Note (Signed)
05/13/2014  8:43 AM  PATIENT:  Veronica Dudley  49 y.o. female  PRE-OPERATIVE DIAGNOSIS:  HISTORY OF LEFT BREAST CANCER  POST-OPERATIVE DIAGNOSIS:  HISTORY OF LEFT BREAST CANCER  PROCEDURE:  Procedure(s) with comments: BREAST RECONSTRUCTION  (Left) LIPOSUCTION WITH LIPOFILLING FOR SYMMETRY (Left) - liposuction from abdomen, lipofilling to left breast  SURGEON:  Surgeon(s) and Role:    * Claire Sanger, DO - Primary  PHYSICIAN ASSISTANT: Shawn Rayburn, PA  ASSISTANTS: none   ANESTHESIA:   general  EBL:  Total I/O In: 1000 [I.V.:1000] Out: -   BLOOD ADMINISTERED:none  DRAINS: none   LOCAL MEDICATIONS USED:  LIDOCAINE and MARCAINE  SPECIMEN:  No Specimen  DISPOSITION OF SPECIMEN:  N/A  COUNTS:  YES  TOURNIQUET:  * No tourniquets in log *  DICTATION: .Dragon Dictation  PLAN OF CARE: Discharge to home after PACU  PATIENT DISPOSITION:  PACU - hemodynamically stable.   Delay start of Pharmacological VTE agent (>24hrs) due to surgical blood loss or risk of bleeding: no

## 2014-05-13 NOTE — Op Note (Signed)
Op report Breast Surgery  DATE OF OPERATION:  05/13/2014  LOCATION: [place]  SURGICAL DIVISION: Plastic Surgery  PREOPERATIVE DIAGNOSES:  1. History of left breast cancer.  2. Left breast asymmetry after surgery.  POSTOPERATIVE DIAGNOSES:  1. History of left breast cancer.  2. Left breast asymmetry after surgery.  PROCEDURE:  1. Scar release of the left breast. 2. Liposuction with lipofilling (50cc) of left breast for symmetry.  SURGEON: Theodoro Kos, DO  ASSISTANT: Shawn Rayburn, PA  ANESTHESIA:  General.   COMPLICATIONS: None.   INDICATIONS FOR PROCEDURE:  The patient, Veronica Dudley, is a 49 y.o. female born on [birth year], is here for treatment for further treatment after a left breast resection for breast cancer.   She now presents for symmetry. MRN: [id number]  CONSENT:  Informed consent was obtained directly from the patient. Risks, benefits and alternatives were fully discussed. Specific risks including but not limited to bleeding, infection, hematoma, seroma, scarring, pain, infection, implant extrusion, contracture, asymmetry, wound healing problems, and need for further surgery were all discussed. The patient did have an ample opportunity to have her questions answered to her satisfaction.   DESCRIPTION OF PROCEDURE:  The patient was taken to the operating room. SCDs were placed and IV antibiotics were given. The patient's chest was prepped and draped in a sterile fashion. A time out was performed and the implants to be used were identified.  One percent Xylocaine with epinephrine was used to infiltrate the area.   The old breast scar was released through a small incision in the lateral aspect of the previous breast scar at the superior pole.  The instrument was used to release the scar at the nipple and the medial skin scar.  Tumescent (400 cc) was placed in the abdominal fat.  After waiting for the local to take effect the liposuction was  performed.   The fat was prepared and then injected into the left upper breast medially and around the NAC (50 cc).  Gloves were changed.  The was closed with 5-0 Monocryl and dermabond was applied.  A breast binder, abdominal binder and ABDs were placed.  The patient was allowed to wake from anesthesia and taken to the recovery room in satisfactory condition.

## 2014-05-13 NOTE — Interval H&P Note (Signed)
History and Physical Interval Note:  05/13/2014 6:59 AM  Veronica Dudley  has presented today for surgery, with the diagnosis of HISTORY OF LEFT BREAST CANCER  The various methods of treatment have been discussed with the patient and family. After consideration of risks, benefits and other options for treatment, the patient has consented to  Procedure(s): BREAST RECONSTRUCTION  (Left) LIPOSUCTION WITH LIPOFILLING FOR SYMMETRY (Left) as a surgical intervention .  The patient's history has been reviewed, patient examined, no change in status, stable for surgery.  I have reviewed the patient's chart and labs.  Questions were answered to the patient's satisfaction.     SANGER,CLAIRE

## 2014-05-13 NOTE — Anesthesia Postprocedure Evaluation (Signed)
  Anesthesia Post-op Note  Patient: Veronica Dudley  Procedure(s) Performed: Procedure(s) with comments: LIPOSUCTION WITH LIPOFILLING FOR SYMMETRY (Left) - liposuction from abdomen, lipofilling to left breast  Patient Location: PACU  Anesthesia Type: General   Level of Consciousness: awake, alert  and oriented  Airway and Oxygen Therapy: Patient Spontanous Breathing  Post-op Pain: mild  Post-op Assessment: Post-op Vital signs reviewed  Post-op Vital Signs: Reviewed  Last Vitals:  Filed Vitals:   05/13/14 1028  BP: 116/85  Pulse: 90  Temp: 36.4 C  Resp: 18    Complications: No apparent anesthesia complications

## 2014-05-13 NOTE — Transfer of Care (Signed)
Immediate Anesthesia Transfer of Care Note  Patient: Veronica Dudley  Procedure(s) Performed: Procedure(s) with comments: LIPOSUCTION WITH LIPOFILLING FOR SYMMETRY (Left) - liposuction from abdomen, lipofilling to left breast  Patient Location: PACU  Anesthesia Type:General  Level of Consciousness: awake, alert  and oriented  Airway & Oxygen Therapy: Patient Spontanous Breathing and Patient connected to face mask oxygen  Post-op Assessment: Report given to PACU RN and Post -op Vital signs reviewed and stable  Post vital signs: Reviewed and stable  Complications: No apparent anesthesia complications

## 2014-05-13 NOTE — Discharge Instructions (Signed)
May shower starting Saturday Continue binders  Call your surgeon if you experience:   1.  Fever over 101.0. 2.  Inability to urinate. 3.  Nausea and/or vomiting. 4.  Extreme swelling or bruising at the surgical site. 5.  Continued bleeding from the incision. 6.  Increased pain, redness or drainage from the incision. 7.  Problems related to your pain medication. 8. Any change in color, movement and/or sensation 9. Any problems and/or concerns Post Anesthesia Home Care Instructions  Activity: Get plenty of rest for the remainder of the day. A responsible adult should stay with you for 24 hours following the procedure.  For the next 24 hours, DO NOT: -Drive a car -Paediatric nurse -Drink alcoholic beverages -Take any medication unless instructed by your physician -Make any legal decisions or sign important papers.  Meals: Start with liquid foods such as gelatin or soup. Progress to regular foods as tolerated. Avoid greasy, spicy, heavy foods. If nausea and/or vomiting occur, drink only clear liquids until the nausea and/or vomiting subsides. Call your physician if vomiting continues.  Special Instructions/Symptoms: Your throat may feel dry or sore from the anesthesia or the breathing tube placed in your throat during surgery. If this causes discomfort, gargle with warm salt water. The discomfort should disappear within 24 hours.

## 2014-05-13 NOTE — H&P (View-Only) (Signed)
Veronica Dudley is an 49 y.o. female.   Chief Complaint: breast asymmetry HPI: The patient is a 49 yrs old female here for a history and physical for breast reconstruction. She was diagnosed with ductal carcinoma in situ of the left breast, intermediate grade with 2.7 cm lesion close to the chest wall but 2 mm negative margin was obtained (08/2008). It was ER positive and PR negative. She has finished radiation (12/2008). She decided not to take the tamoxifen. She had a hysterectomy, cholecystectomy, and a c-section. She quit smoking several years ago. She is 5 feet 8 inches, weighs 154, wears a 36C bra. Her last mammogram was 6 months ago and was benign. She has an axillary scar, upper pole scar and areola scar at the superior aspect. All are well healed. She would like to more symmetric.  Past Medical History  Diagnosis Date  . Cancer     lt breast ca 2010/surg-lumpectomy/rad tx  . Breast cancer 6/10    DCIS  . Endometriosis   . Iron deficiency anemia   . Breast CA 09/07/2011  . Iron deficiency anemia 09/07/2011  . H/O bilateral oophorectomy 03/19/2014    Past Surgical History  Procedure Laterality Date  . Cesarean section    . Lumpectomy  2010    left lump-snbx  . Bilateral oophorectomy Bilateral 2010  . Cholecystectomy  2005    lap choli  . Abdominal hysterectomy  2010  . Diagnostic laparoscopy  2004    incisional mass  . Laparoscopy  2006    LOA    Family History  Problem Relation Age of Onset  . Heart attack Father   . Cancer Maternal Aunt     ovarian cancer   Social History:  reports that she quit smoking about 18 years ago. She has never used smokeless tobacco. She reports that she drinks about 1.2 oz of alcohol per week. She reports that she does not use illicit drugs.  Allergies: No Known Allergies   (Not in a hospital admission)  No results found for this or any previous visit (from the past 48 hour(s)). No results found.  Review of Systems  Constitutional:  Negative.   HENT: Negative.   Eyes: Negative.   Respiratory: Negative.   Cardiovascular: Negative.   Gastrointestinal: Negative.   Genitourinary: Negative.   Musculoskeletal: Negative.   Skin: Negative.   Neurological: Negative.   Psychiatric/Behavioral: Negative.     There were no vitals taken for this visit. Physical Exam  Constitutional: She is oriented to person, place, and time. She appears well-developed and well-nourished.  HENT:  Head: Normocephalic and atraumatic.  Eyes: Conjunctivae are normal. Pupils are equal, round, and reactive to light.  Cardiovascular: Normal rate.   Respiratory: Effort normal.  GI: Soft. She exhibits no distension.  Musculoskeletal: Normal range of motion.  Neurological: She is alert and oriented to person, place, and time.  Skin: Skin is warm.  Psychiatric: She has a normal mood and affect. Her behavior is normal. Judgment and thought content normal.     Assessment/Plan Once all reconstruction options were presented, a focused discussion was had regarding the patient's suitability for procedures. Recommend fat grafting of the left breast for symmetry.       SANGER,CLAIRE 05/07/2014, 12:46 PM

## 2014-05-13 NOTE — Anesthesia Preprocedure Evaluation (Signed)
Anesthesia Evaluation  Patient identified by MRN, date of birth, ID band Patient awake    Reviewed: Allergy & Precautions, NPO status , Patient's Chart, lab work & pertinent test results  History of Anesthesia Complications (+) PONV  Airway Mallampati: I  TM Distance: >3 FB Neck ROM: Full    Dental  (+) Teeth Intact, Dental Advisory Given   Pulmonary former smoker,  breath sounds clear to auscultation        Cardiovascular Rhythm:Regular Rate:Normal     Neuro/Psych    GI/Hepatic   Endo/Other    Renal/GU      Musculoskeletal   Abdominal   Peds  Hematology   Anesthesia Other Findings   Reproductive/Obstetrics                             Anesthesia Physical Anesthesia Plan  ASA: II  Anesthesia Plan: General   Post-op Pain Management:    Induction: Intravenous  Airway Management Planned: Oral ETT  Additional Equipment:   Intra-op Plan:   Post-operative Plan: Extubation in OR  Informed Consent: I have reviewed the patients History and Physical, chart, labs and discussed the procedure including the risks, benefits and alternatives for the proposed anesthesia with the patient or authorized representative who has indicated his/her understanding and acceptance.   Dental advisory given  Plan Discussed with: CRNA, Anesthesiologist and Surgeon  Anesthesia Plan Comments:         Anesthesia Quick Evaluation

## 2014-05-14 ENCOUNTER — Encounter (HOSPITAL_BASED_OUTPATIENT_CLINIC_OR_DEPARTMENT_OTHER): Payer: Self-pay | Admitting: Plastic Surgery

## 2014-09-30 ENCOUNTER — Other Ambulatory Visit (HOSPITAL_COMMUNITY): Payer: Self-pay | Admitting: Family Medicine

## 2014-09-30 DIAGNOSIS — Z9889 Other specified postprocedural states: Secondary | ICD-10-CM

## 2014-09-30 DIAGNOSIS — Z1231 Encounter for screening mammogram for malignant neoplasm of breast: Secondary | ICD-10-CM

## 2014-10-19 ENCOUNTER — Other Ambulatory Visit (HOSPITAL_COMMUNITY): Payer: Self-pay | Admitting: Family Medicine

## 2014-10-19 ENCOUNTER — Ambulatory Visit (HOSPITAL_COMMUNITY)
Admission: RE | Admit: 2014-10-19 | Discharge: 2014-10-19 | Disposition: A | Discharge status: Home / Self Care | Payer: BLUE CROSS/BLUE SHIELD | Source: Ambulatory Visit | Attending: Family Medicine | Admitting: Family Medicine

## 2014-10-19 DIAGNOSIS — Z9889 Other specified postprocedural states: Secondary | ICD-10-CM

## 2014-10-19 DIAGNOSIS — Z853 Personal history of malignant neoplasm of breast: Secondary | ICD-10-CM | POA: Insufficient documentation

## 2014-10-19 DIAGNOSIS — Z1231 Encounter for screening mammogram for malignant neoplasm of breast: Secondary | ICD-10-CM

## 2014-10-22 ENCOUNTER — Other Ambulatory Visit (HOSPITAL_COMMUNITY): Payer: Self-pay | Admitting: Internal Medicine

## 2014-10-22 DIAGNOSIS — R109 Unspecified abdominal pain: Secondary | ICD-10-CM

## 2014-10-25 ENCOUNTER — Ambulatory Visit (HOSPITAL_COMMUNITY)
Admission: RE | Admit: 2014-10-25 | Discharge: 2014-10-25 | Disposition: A | Discharge status: Home / Self Care | Payer: BLUE CROSS/BLUE SHIELD | Source: Ambulatory Visit | Attending: Internal Medicine | Admitting: Internal Medicine

## 2014-10-25 DIAGNOSIS — Z9049 Acquired absence of other specified parts of digestive tract: Secondary | ICD-10-CM | POA: Diagnosis not present

## 2014-10-25 DIAGNOSIS — K7689 Other specified diseases of liver: Secondary | ICD-10-CM | POA: Insufficient documentation

## 2014-10-25 DIAGNOSIS — Z90710 Acquired absence of both cervix and uterus: Secondary | ICD-10-CM | POA: Insufficient documentation

## 2014-10-25 DIAGNOSIS — R109 Unspecified abdominal pain: Secondary | ICD-10-CM

## 2014-10-25 MED ORDER — IOHEXOL 300 MG/ML  SOLN
100.0000 mL | Freq: Once | INTRAMUSCULAR | Status: AC | PRN
  Administered 2014-10-25: 100 mL via INTRAVENOUS

## 2014-12-01 ENCOUNTER — Other Ambulatory Visit (HOSPITAL_COMMUNITY): Payer: Self-pay | Admitting: Internal Medicine

## 2014-12-01 DIAGNOSIS — M545 Low back pain: Secondary | ICD-10-CM

## 2014-12-01 DIAGNOSIS — R319 Hematuria, unspecified: Secondary | ICD-10-CM

## 2014-12-03 ENCOUNTER — Other Ambulatory Visit (HOSPITAL_COMMUNITY): Payer: BLUE CROSS/BLUE SHIELD

## 2015-02-01 ENCOUNTER — Other Ambulatory Visit: Payer: Self-pay | Admitting: Obstetrics & Gynecology

## 2015-02-02 LAB — CYTOLOGY - PAP

## 2015-02-10 ENCOUNTER — Encounter (INDEPENDENT_AMBULATORY_CARE_PROVIDER_SITE_OTHER): Payer: Self-pay | Admitting: *Deleted

## 2015-03-21 ENCOUNTER — Ambulatory Visit (HOSPITAL_COMMUNITY): Payer: BLUE CROSS/BLUE SHIELD | Admitting: Oncology

## 2015-03-21 ENCOUNTER — Ambulatory Visit (HOSPITAL_COMMUNITY): Payer: BC Managed Care – PPO | Admitting: Oncology

## 2015-03-21 ENCOUNTER — Other Ambulatory Visit (HOSPITAL_COMMUNITY): Payer: BC Managed Care – PPO

## 2015-03-21 ENCOUNTER — Ambulatory Visit (HOSPITAL_COMMUNITY): Payer: BC Managed Care – PPO | Admitting: Hematology & Oncology

## 2015-04-22 ENCOUNTER — Encounter (HOSPITAL_COMMUNITY): Payer: Self-pay | Admitting: Oncology

## 2015-04-22 ENCOUNTER — Other Ambulatory Visit (HOSPITAL_COMMUNITY): Payer: Self-pay | Admitting: Oncology

## 2015-04-22 ENCOUNTER — Encounter (HOSPITAL_COMMUNITY): Payer: BLUE CROSS/BLUE SHIELD

## 2015-04-22 ENCOUNTER — Encounter (HOSPITAL_COMMUNITY): Payer: BLUE CROSS/BLUE SHIELD | Attending: Oncology | Admitting: Oncology

## 2015-04-22 VITALS — BP 111/79 | HR 86 | Temp 98.2°F | Resp 18 | Wt 166.8 lb

## 2015-04-22 DIAGNOSIS — D0510 Intraductal carcinoma in situ of unspecified breast: Secondary | ICD-10-CM

## 2015-04-22 DIAGNOSIS — D509 Iron deficiency anemia, unspecified: Secondary | ICD-10-CM | POA: Insufficient documentation

## 2015-04-22 DIAGNOSIS — D0512 Intraductal carcinoma in situ of left breast: Secondary | ICD-10-CM | POA: Diagnosis present

## 2015-04-22 LAB — CBC WITH DIFFERENTIAL/PLATELET
BASOS ABS: 0 10*3/uL (ref 0.0–0.1)
BASOS PCT: 1 %
Eosinophils Absolute: 0.1 10*3/uL (ref 0.0–0.7)
Eosinophils Relative: 3 %
HCT: 36.3 % (ref 36.0–46.0)
HEMOGLOBIN: 11.6 g/dL — AB (ref 12.0–15.0)
Lymphocytes Relative: 44 %
Lymphs Abs: 1.8 10*3/uL (ref 0.7–4.0)
MCH: 27.6 pg (ref 26.0–34.0)
MCHC: 32 g/dL (ref 30.0–36.0)
MCV: 86.4 fL (ref 78.0–100.0)
Monocytes Absolute: 0.2 10*3/uL (ref 0.1–1.0)
Monocytes Relative: 4 %
NEUTROS ABS: 2 10*3/uL (ref 1.7–7.7)
NEUTROS PCT: 48 %
Platelets: 223 10*3/uL (ref 150–400)
RBC: 4.2 MIL/uL (ref 3.87–5.11)
RDW: 12.9 % (ref 11.5–15.5)
WBC: 4.1 10*3/uL (ref 4.0–10.5)

## 2015-04-22 LAB — COMPREHENSIVE METABOLIC PANEL
ALBUMIN: 4.1 g/dL (ref 3.5–5.0)
ALK PHOS: 65 U/L (ref 38–126)
ALT: 16 U/L (ref 14–54)
AST: 23 U/L (ref 15–41)
Anion gap: 6 (ref 5–15)
BILIRUBIN TOTAL: 0.8 mg/dL (ref 0.3–1.2)
BUN: 14 mg/dL (ref 6–20)
CO2: 26 mmol/L (ref 22–32)
CREATININE: 0.66 mg/dL (ref 0.44–1.00)
Calcium: 9.2 mg/dL (ref 8.9–10.3)
Chloride: 107 mmol/L (ref 101–111)
GFR calc Af Amer: >60 mL/min (ref >60)
GFR calc non Af Amer: >60 mL/min (ref >60)
Glucose, Bld: 106 mg/dL — ABNORMAL HIGH (ref 65–99)
Potassium: 3.6 mmol/L (ref 3.5–5.1)
Sodium: 139 mmol/L (ref 135–145)
TOTAL PROTEIN: 7.6 g/dL (ref 6.5–8.1)

## 2015-04-22 LAB — IRON AND TIBC
Iron: 111 ug/dL (ref 28–170)
Saturation Ratios: 34 % — ABNORMAL HIGH (ref 10.4–31.8)
TIBC: 323 ug/dL (ref 250–450)
UIBC: 212 ug/dL

## 2015-04-22 LAB — FERRITIN: Ferritin: 35 ng/mL (ref 11–307)

## 2015-04-22 NOTE — Patient Instructions (Addendum)
[place] at [facility] Discharge Instructions  RECOMMENDATIONS MADE BY THE CONSULTANT AND ANY TEST RESULTS WILL BE SENT TO YOUR REFERRING PHYSICIAN.   Exam completed by Kirby Crigler PA today Lab work today We will call you with the results of your lab work Mammogram ordered Return to see the doctor in 1 year with labs Please call the clinic if you have any questions or concerns    Thank you for choosing [place] at [facility] to provide your oncology and hematology care.  To afford each patient quality time with our provider, please arrive at least 15 minutes before your scheduled appointment time.    You need to re-schedule your appointment should you arrive 10 or more minutes late.  We strive to give you quality time with our providers, and arriving late affects you and other patients whose appointments are after yours.  Also, if you no show three or more times for appointments you may be dismissed from the clinic at the providers discretion.     Again, thank you for choosing [facility].  Our hope is that these requests will decrease the amount of time that you wait before being seen by our physicians.       _____________________________________________________________  Should you have questions after your visit to [facility], please contact our office at [phone] between the hours of 8:30 a.m. and 4:30 p.m.  Voicemails left after 4:30 p.m. will not be returned until the following business day.  For prescription refill requests, have your pharmacy contact our office.

## 2015-04-22 NOTE — Progress Notes (Signed)
Veronica Dudley, [place] [id number]  Ductal carcinoma in situ of breast, left - Plan: CBC with Differential, Comprehensive metabolic panel, MM DIAG BREAST TOMO BILATERAL, US BREAST LTD UNI LEFT INC AXILLA, US BREAST LTD UNI RIGHT INC AXILLA, CANCELED: MM Digital Diagnostic Bilat  Iron deficiency anemia - Plan: CBC with Differential, Ferritin, Iron and TIBC  CURRENT THERAPY: Surveillance per NCCN guidelines  INTERVAL HISTORY: [place] 49 y.o. female returns for  regular  visit for followup of noninvasive ductal neoplasia of the breast, status post lumpectomy on no additional therapy (refused by patient) systemically but did receive radiotherapy.   I personally reviewed and went over laboratory results with the patient.  The results are noted within this dictation.  I personally reviewed and went over radiographic studies with the patient.  The results are noted within this dictation.  Mammogram in June 2016 was BIRADS 2.  She will be due again in June 2017 for her next mammogram.  Further review of her imaging tests, she is S/P CT abd/pelvis for flank pain.  This exam was unimpressive with stable findings only.  She is doing very well from a breast cancer standpoint. She is S/P left breast reconstruction by Dr. Theodoro Kos in Jan 2016 due to left breast asymmetry.  She notes that the intervention was unsuccessful.  However, she is comfortable with the aesthetics of her breasts at this time.  She is not interested in further intervention.  She notes constipation.  She has been referred to Dr. Laural Golden and she has an upcoming colonoscopy with him.  She knows we are available to her if she has an abnormal colonoscopy that requires oncology intervention.    Past Medical History  Diagnosis Date  . Cancer ([place])     lt breast ca 2010/surg-lumpectomy/rad tx  . Breast cancer ([place]) 6/10    DCIS  . Endometriosis   . Iron deficiency anemia   . Breast CA  ([place]) 09/07/2011  . Iron deficiency anemia 09/07/2011  . H/O bilateral oophorectomy 03/19/2014  . PONV (postoperative nausea and vomiting)     has Ductal carcinoma in situ of breast; Iron deficiency anemia; H/O bilateral oophorectomy; and Breast asymmetry following reconstructive surgery on her problem list.     has No Known Allergies.  Ms. Monds had no medications administered during this visit.  Past Surgical History  Procedure Laterality Date  . Cesarean section    . Lumpectomy  2010    left lump-snbx  . Bilateral oophorectomy Bilateral 2010  . Cholecystectomy  2005    lap choli  . Abdominal hysterectomy  2010  . Diagnostic laparoscopy  2004    incisional mass  . Laparoscopy  2006    LOA  . Liposuction with lipofilling Left 05/13/2014    Procedure: LIPOSUCTION WITH LIPOFILLING FOR SYMMETRY;  Surgeon: Theodoro Kos, DO;  Location: [facility];  Service: Plastics;  Laterality: Left;  liposuction from abdomen, lipofilling to left breast    Denies any headaches, dizziness, double vision, fevers, chills, night sweats, nausea, vomiting, diarrhea, constipation, chest pain, heart palpitations, shortness of breath, blood in stool, black tarry stool, urinary pain, urinary burning, urinary frequency, hematuria.   PHYSICAL EXAMINATION  ECOG PERFORMANCE STATUS: 0 - Asymptomatic  Filed Vitals:   04/22/15 1300  BP: 111/79  Pulse: 86  Temp: 98.2 F (36.8 C)  Resp: 18    GENERAL:alert, healthy, no distress, well nourished, well developed, comfortable, cooperative and smiling,  unaccompanied. SKIN: skin color, texture, turgor are normal, no rashes or significant lesions HEAD: Normocephalic, No masses, lesions, tenderness or abnormalities EYES: normal, PERRLA, EOMI, Conjunctiva are pink and non-injected EARS: External ears normal OROPHARYNX:lips, buccal mucosa, and tongue normal and mucous membranes are moist  NECK: supple, no adenopathy, thyroid normal size, non-tender,  without nodularity, no stridor, non-tender, trachea midline LYMPH:  no palpable lymphadenopathy, no hepatosplenomegaly BREAST:Left post-lumpectomy site is well healed in the superior aspect of left breast without any abnormality appreciated otherwise, right breast with some fibroglandular tissue in the inferior quadrants about 3-4 cm inferior to nipple LUNGS: clear to auscultation and percussion HEART: regular rate & rhythm, no murmurs, no gallops, S1 normal and S2 normal ABDOMEN:abdomen soft, non-tender, normal bowel sounds, no masses or organomegaly and no hepatosplenomegaly BACK: Back symmetric, no curvature., No CVA tenderness EXTREMITIES:less then 2 second capillary refill, no joint deformities, effusion, or inflammation, no edema, no skin discoloration, no clubbing, no cyanosis  NEURO: alert & oriented x 3 with fluent speech, no focal motor/sensory deficits, gait normal   LABORATORY DATA: CBC    Component Value Date/Time   WBC 4.1 04/22/2015 1330   RBC 4.20 04/22/2015 1330   HGB 11.6* 04/22/2015 1330   HCT 36.3 04/22/2015 1330   PLT 223 04/22/2015 1330   MCV 86.4 04/22/2015 1330   MCH 27.6 04/22/2015 1330   MCHC 32.0 04/22/2015 1330   RDW 12.9 04/22/2015 1330   LYMPHSABS 1.8 04/22/2015 1330   MONOABS 0.2 04/22/2015 1330   EOSABS 0.1 04/22/2015 1330   BASOSABS 0.0 04/22/2015 1330      Chemistry      Component Value Date/Time   NA 139 04/22/2015 1330   K 3.6 04/22/2015 1330   CL 107 04/22/2015 1330   CO2 26 04/22/2015 1330   BUN 14 04/22/2015 1330   CREATININE 0.66 04/22/2015 1330      Component Value Date/Time   CALCIUM 9.2 04/22/2015 1330   ALKPHOS 65 04/22/2015 1330   AST 23 04/22/2015 1330   ALT 16 04/22/2015 1330   BILITOT 0.8 04/22/2015 1330     Lab Results  Component Value Date   FERRITIN 57 03/19/2014     RADIOGRAPHIC STUDIES:   CLINICAL DATA: History of left lumpectomy in 2010.  EXAM: DIGITAL DIAGNOSTIC BILATERAL MAMMOGRAM WITH 3D  TOMOSYNTHESIS AND CAD  COMPARISON: 09/30/2013 and earlier  ACR Breast Density Category c: The breast tissue is heterogeneously dense, which may obscure small masses.  FINDINGS: Postoperative changes are seen in the left breast. No suspicious mass, distortion, or microcalcifications are identified to suggest presence of malignancy.  Mammographic images were processed with CAD.  IMPRESSION: No mammographic evidence for malignancy.  RECOMMENDATION: Diagnostic mammogram is suggested in 1 year. (Code:DM-B-01Y)  I have discussed the findings and recommendations with the patient. Results were also provided in writing at the conclusion of the visit. If applicable, a reminder letter will be sent to the patient regarding the next appointment.  BI-RADS CATEGORY 2: Benign.   Electronically Signed  By: Nolon Nations M.D.  On: 10/19/2014 11:59     ASSESSMENT/PLAN:   Ductal carcinoma in situ of breast DCIS, left breast.  S/P lumpectomy 10/22/2008 followed by XRT finishing on 01/21/2009.  DCIS was intermediate grade and measured 2.7 cm close to the chest wall, ER 100%, PR negative.  2 mm (at least) of negative margins were obtained at time of surgery. She refused anti-estrogen therapy.  She is S/P breast reconstruction due to asymmetry by Dr. Lyndee Leo  [place] on 05/13/2014  Labs ordered today: CBC diff, CMET  She is up-to-date on mammography.  Her last one was in June 2016 and was BIRADS 2.  She will be due for her next diagnostic mammogram in June 2017.  I have placed the order and I will request scheduling of this at today's visit.  Labs in 1 year: CBC diff, CMET  Return in 1 year for follow-up, sooner if needed.  She is having a colonoscopy by Dr. Laural Golden in the near future for constipation.  Additionally, she is now 49 years old and is due anyway.  She knows we are available to her if needed.  Iron deficiency anemia Not on oral replacement.  Labs today: CBC,  iron/TIBC, ferritin  Labs in 12 months: CBC, iron/TIBC, ferritin  If her ferritin is less than 100, I would consider IV replacement of iron.  She is already suffering from constipation and therefore, oral iron replacement is contraindicated.  She is given some elementary education regarding IV iron.  If needed, I would have her come in for an appointment to review the risks, benefits, alternatives, and side effects of IV iron so she is aware.  I opted not to review them at this time as she may not need IV iron.   THERAPY PLAN:  NCCN guidelines recommends the following surveillance for DCIS of breast:  1. Interval history and physical exam every 6-12 months for 5 years, then annually.  2. Mammogram every 12 months (and 6-12 months postradiation therapy if breast conserved [Category 2B]).  3. If treated with Tamoxifen, monitor per NCCN guidelines for Breast Cancer Risk Reduction.  NCCN guidelines recommends the following for DCIS postsurgical treatment:  A. Risk reduction therapy for ipsilateral breast-conserving surgery:   1. Consider endocrine therapy for 5 years:    A. Patient treated with breast-conserving therapy (lumpectomy) and radiation therapy (category 1), especially for those with ER-positive DCIS    B. The benefit of endocrine therapy for ER-negative DCIS is uncertain    C. Patients treated with excision alone   2. Endocrine therapy:    A. Tamoxifen for premenopausal patients    B. Tamoxifen or aromatase inhibitor for postmenopausal patients with some advantage for aromatase inhibitor therapy in patients < 25 years old or with concerns for thromboembolism  B. Risk reduction therapy for contralateral breast   1. Counseling regarding risk reduction.   All questions were answered. The patient knows to call the clinic with any problems, questions or concerns. We can certainly see the patient much sooner if necessary.  Patient and plan discussed with Dr. Ancil Linsey and she is in  agreement with the aforementioned.   KEFALAS,THOMAS 04/22/2015  2:33 PM

## 2015-04-22 NOTE — Assessment & Plan Note (Addendum)
DCIS, left breast.  S/P lumpectomy 10/22/2008 followed by XRT finishing on 01/21/2009.  DCIS was intermediate grade and measured 2.7 cm close to the chest wall, ER 100%, PR negative.  2 mm (at least) of negative margins were obtained at time of surgery. She refused anti-estrogen therapy.  She is S/P breast reconstruction due to asymmetry by Dr. Theodoro Kos on 05/13/2014  Labs ordered today: CBC diff, CMET  She is up-to-date on mammography.  Her last one was in June 2016 and was BIRADS 2.  She will be due for her next diagnostic mammogram in June 2017.  I have placed the order and I will request scheduling of this at today's visit.  Labs in 1 year: CBC diff, CMET  Return in 1 year for follow-up, sooner if needed.  She is having a colonoscopy by Dr. Laural Golden in the near future for constipation.  Additionally, she is now 49 years old and is due anyway.  She knows we are available to her if needed.

## 2015-04-22 NOTE — Assessment & Plan Note (Addendum)
Not on oral replacement.  Labs today: CBC, iron/TIBC, ferritin  Labs in 12 months: CBC, iron/TIBC, ferritin  If her ferritin is less than 100, I would consider IV replacement of iron.  She is already suffering from constipation and therefore, oral iron replacement is contraindicated.  She is given some elementary education regarding IV iron.  If needed, I would have her come in for an appointment to review the risks, benefits, alternatives, and side effects of IV iron so she is aware.  I opted not to review them at this time as she may not need IV iron.

## 2015-04-22 NOTE — Progress Notes (Signed)
Veronica Dudley's reason for visit today is for labs as scheduled per MD orders.  Venipuncture performed with a 23 gauge butterfly needle to R Antecubital.  Veronica Dudley tolerated procedure well and without incident; questions were answered and patient was discharged.  x2 sticks tolerated well

## 2015-04-25 ENCOUNTER — Other Ambulatory Visit (HOSPITAL_COMMUNITY): Payer: Self-pay | Admitting: Oncology

## 2015-04-25 DIAGNOSIS — D509 Iron deficiency anemia, unspecified: Secondary | ICD-10-CM

## 2015-04-26 ENCOUNTER — Telehealth (HOSPITAL_COMMUNITY): Payer: Self-pay | Admitting: Oncology

## 2015-04-26 ENCOUNTER — Encounter (INDEPENDENT_AMBULATORY_CARE_PROVIDER_SITE_OTHER): Payer: Self-pay | Admitting: *Deleted

## 2015-04-26 ENCOUNTER — Telehealth (HOSPITAL_COMMUNITY): Payer: Self-pay

## 2015-04-26 NOTE — Telephone Encounter (Signed)
Patient has not had genetic testing and would like to speak with Robynn Pane, PA-C to discuss why this needs to be done.

## 2015-04-26 NOTE — Telephone Encounter (Signed)
-----   Message from Baird [facility], PA-C sent at 04/26/2015  9:05 AM EST ----- Please call patient and ask if she has ever had genetic testing for her DCIS?  I do not see record of this.  If not, please refer her to genetics.  I discussed her case with Roma Kayser and she does qualify for testing.  Let me know if I need to call her if she has questions, concerns, or wants me to discuss with her.  Thanks you  KEFALAS,THOMAS, PA-C

## 2015-04-26 NOTE — Telephone Encounter (Signed)
VM left for patient to call us back at the clinic.  I would like to discuss the role of genetic counseling and iron infusion with the patient.  Robynn Pane, PA-C 04/26/2015 3:57 PM

## 2015-04-27 ENCOUNTER — Other Ambulatory Visit (INDEPENDENT_AMBULATORY_CARE_PROVIDER_SITE_OTHER): Payer: Self-pay | Admitting: *Deleted

## 2015-04-27 DIAGNOSIS — Z1211 Encounter for screening for malignant neoplasm of colon: Secondary | ICD-10-CM

## 2015-04-28 ENCOUNTER — Telehealth (HOSPITAL_COMMUNITY): Payer: Self-pay | Admitting: Oncology

## 2015-04-28 NOTE — Telephone Encounter (Signed)
VM left at 1635 regarding genetics counseling opportunity and IV iron replacement.  No answer.  Message left for return phone call.  Robynn Pane, PA-C 04/28/2015 4:36 PM

## 2015-04-29 ENCOUNTER — Other Ambulatory Visit (HOSPITAL_COMMUNITY): Payer: Self-pay | Admitting: Oncology

## 2015-04-29 ENCOUNTER — Telehealth (HOSPITAL_COMMUNITY): Payer: Self-pay | Admitting: Oncology

## 2015-04-29 DIAGNOSIS — D509 Iron deficiency anemia, unspecified: Secondary | ICD-10-CM

## 2015-04-29 NOTE — Telephone Encounter (Signed)
I returned the patient's phone call.  She called the clinic today.  For a number of days we have not been able to connect to discuss her lab results and genetic counseling qualification.  I personally reviewed and went over radiographic studies with the patient.  The results are noted within this dictation.  Her Hgb is 11.6 g/dL.  Her ferritin is 35.  Her calculated iron deficit is 227 mg.  I discussed the role of IV iron infusion.  I discussed the risks, benefits, alternative, and side effects including anaphylaxis.  She is agreeable to IV iron replacement.  I will start with 1 dose of ferric gluconate 125 mg.  I will recheck labs in 3 months.  She is scheduled for colonoscopy upcoming with Dr. Laural Golden.  I have not yet performed a good anemia evaluation.   I have asked Roma Kayser to review the patient's DCIS history.  She reports that Yun is a candidate for genetic counseling.  I reviewed this information with the patient.  I explained the role of genetic counseling in her oncology care and how this may impact her care moving forward and her family members.  She is agreeable to both.    I will message our scheduler regarding both of these issues.  Robynn Pane, PA-C 04/29/2015 12:15 PM

## 2015-05-05 ENCOUNTER — Encounter (HOSPITAL_COMMUNITY): Payer: BLUE CROSS/BLUE SHIELD | Attending: Oncology

## 2015-05-05 ENCOUNTER — Encounter (HOSPITAL_COMMUNITY): Payer: Self-pay

## 2015-05-05 VITALS — BP 113/72 | HR 55 | Temp 97.8°F | Resp 20

## 2015-05-05 DIAGNOSIS — D509 Iron deficiency anemia, unspecified: Secondary | ICD-10-CM

## 2015-05-05 DIAGNOSIS — D0512 Intraductal carcinoma in situ of left breast: Secondary | ICD-10-CM | POA: Insufficient documentation

## 2015-05-05 MED ORDER — NA FERRIC GLUC CPLX IN SUCROSE 12.5 MG/ML IV SOLN
125.0000 mg | Freq: Once | INTRAVENOUS | Status: AC
  Administered 2015-05-05: 125 mg via INTRAVENOUS
  Filled 2015-05-05: qty 10

## 2015-05-05 MED ORDER — SODIUM CHLORIDE 0.9 % IV SOLN
INTRAVENOUS | Status: DC
  Administered 2015-05-05: 14:00:00 via INTRAVENOUS

## 2015-05-05 NOTE — Progress Notes (Signed)
Patient tolerated IV iron infusion well.

## 2015-05-05 NOTE — Patient Instructions (Signed)
[place] at [facility] Discharge Instructions  RECOMMENDATIONS MADE BY THE CONSULTANT AND ANY TEST RESULTS WILL BE SENT TO YOUR REFERRING PHYSICIAN.  You received IV iron today. Follow up in two weeks for labs.  Thank you for choosing [place] at [facility] to provide your oncology and hematology care.  To afford each patient quality time with our provider, please arrive at least 15 minutes before your scheduled appointment time.    You need to re-schedule your appointment should you arrive 10 or more minutes late.  We strive to give you quality time with our providers, and arriving late affects you and other patients whose appointments are after yours.  Also, if you no show three or more times for appointments you may be dismissed from the clinic at the providers discretion.     Again, thank you for choosing [facility].  Our hope is that these requests will decrease the amount of time that you wait before being seen by our physicians.       _____________________________________________________________  Should you have questions after your visit to [facility], please contact our office at [phone] between the hours of 8:30 a.m. and 4:30 p.m.  Voicemails left after 4:30 p.m. will not be returned until the following business day.  For prescription refill requests, have your pharmacy contact our office.

## 2015-05-05 NOTE — Progress Notes (Signed)
Pt reports trace amounts of blood w/bowel movements x 2 weeks. States the blood is only there when she wipes, and is bright red in color. Pt has colonoscopy scheduled with Dr. Laural Golden 06/12/15.  This information is shared with Dr. Whitney Muse - instructed to have pt come in for recheck CBC, Ferritin in 2 weeks.  Pt agreeable to this plan.  Instructed to seek emergency medical attention for any profuse rectal bleeding.

## 2015-05-10 ENCOUNTER — Telehealth (INDEPENDENT_AMBULATORY_CARE_PROVIDER_SITE_OTHER): Payer: Self-pay | Admitting: *Deleted

## 2015-05-10 ENCOUNTER — Encounter (INDEPENDENT_AMBULATORY_CARE_PROVIDER_SITE_OTHER): Payer: Self-pay | Admitting: *Deleted

## 2015-05-10 NOTE — Telephone Encounter (Signed)
Referring MD/PCP: golding   Procedure: tcs  Reason/Indication:  Screening, fam hx colon ca  Has patient had this procedure before?  n  If so, when, by whom and where?    Is there a family history of colon cancer?  Yes, maternal aunt  Who?  What age when diagnosed?    Is patient diabetic?   no      Does patient have prosthetic heart valve or mechanical valve?  no  Do you have a pacemaker?  no  Has patient ever had endocarditis? no  Has patient had joint replacement within last 12 months?  no  Does patient tend to be constipated or take laxatives? Yes, constipated  Does patient have a history of alcohol/drug use?  no  Is patient on Coumadin, Plavix and/or Aspirin? no  Medications: oscal with D 2 times per week  Allergies: nkda  Medication Adjustment:   Procedure date & time: 05/12/15 at 930

## 2015-05-12 ENCOUNTER — Encounter (HOSPITAL_COMMUNITY): Payer: Self-pay | Admitting: *Deleted

## 2015-05-12 ENCOUNTER — Encounter (HOSPITAL_COMMUNITY)
Admission: RE | Disposition: A | Discharge status: Home / Self Care | Payer: Self-pay | Source: Ambulatory Visit | Attending: Internal Medicine

## 2015-05-12 ENCOUNTER — Ambulatory Visit: Payer: Self-pay | Admitting: Genetic Counselor

## 2015-05-12 ENCOUNTER — Ambulatory Visit (HOSPITAL_COMMUNITY)
Admission: RE | Admit: 2015-05-12 | Discharge: 2015-05-12 | Disposition: A | Discharge status: Home / Self Care | Payer: BLUE CROSS/BLUE SHIELD | Source: Ambulatory Visit | Attending: Internal Medicine | Admitting: Internal Medicine

## 2015-05-12 ENCOUNTER — Encounter (HOSPITAL_COMMUNITY): Payer: BLUE CROSS/BLUE SHIELD | Admitting: Genetic Counselor

## 2015-05-12 DIAGNOSIS — Z1211 Encounter for screening for malignant neoplasm of colon: Secondary | ICD-10-CM | POA: Diagnosis present

## 2015-05-12 DIAGNOSIS — K644 Residual hemorrhoidal skin tags: Secondary | ICD-10-CM | POA: Diagnosis not present

## 2015-05-12 DIAGNOSIS — K648 Other hemorrhoids: Secondary | ICD-10-CM

## 2015-05-12 DIAGNOSIS — Z79899 Other long term (current) drug therapy: Secondary | ICD-10-CM | POA: Insufficient documentation

## 2015-05-12 DIAGNOSIS — K5909 Other constipation: Secondary | ICD-10-CM | POA: Insufficient documentation

## 2015-05-12 DIAGNOSIS — Z800 Family history of malignant neoplasm of digestive organs: Secondary | ICD-10-CM | POA: Diagnosis not present

## 2015-05-12 DIAGNOSIS — Z8371 Family history of colonic polyps: Secondary | ICD-10-CM | POA: Diagnosis not present

## 2015-05-12 HISTORY — PX: COLONOSCOPY: SHX5424

## 2015-05-12 SURGERY — COLONOSCOPY
Anesthesia: Moderate Sedation

## 2015-05-12 MED ORDER — MEPERIDINE HCL 50 MG/ML IJ SOLN
INTRAMUSCULAR | Status: DC
Start: 2015-05-12 — End: 2015-05-12
  Filled 2015-05-12: qty 1

## 2015-05-12 MED ORDER — MIDAZOLAM HCL 5 MG/5ML IJ SOLN
INTRAMUSCULAR | Status: AC
  Filled 2015-05-12: qty 10

## 2015-05-12 MED ORDER — MIDAZOLAM HCL 5 MG/5ML IJ SOLN
INTRAMUSCULAR | Status: DC | PRN
  Administered 2015-05-12 (×3): 2 mg via INTRAVENOUS

## 2015-05-12 MED ORDER — MEPERIDINE HCL 50 MG/ML IJ SOLN
INTRAMUSCULAR | Status: DC | PRN
  Administered 2015-05-12 (×2): 25 mg via INTRAVENOUS

## 2015-05-12 MED ORDER — SODIUM CHLORIDE 0.9 % IV SOLN
INTRAVENOUS | Status: DC
  Administered 2015-05-12: 09:00:00 via INTRAVENOUS

## 2015-05-12 MED ORDER — HYDROCORTISONE ACETATE 25 MG RE SUPP: 25.0000 mg | Freq: Every day | RECTAL | Status: DC

## 2015-05-12 MED ORDER — SIMETHICONE 40 MG/0.6ML PO SUSP
ORAL | Status: DC | PRN
  Administered 2015-05-12: 2.5 mL

## 2015-05-12 NOTE — Progress Notes (Signed)
The patient did not show for her appointment.  Please re-refer if she is interested.

## 2015-05-12 NOTE — Op Note (Addendum)
COLONOSCOPY PROCEDURE REPORT  PATIENT:  Veronica Dudley  MR#:  [id number] Birthdate:  [birth year], 50 y.o., female Endoscopist:  Dr. Rogene Houston, MD Referred By:  Dr. Purvis Kilts, MD Procedure Date: 05/12/2015  Procedure:   Colonoscopy  Indications:  Patient is 50 year old African female who is undergoing high risk screening colonoscopy. Family history significant for CRC maternal aunt and first cousin and they both died in their early 47s. Her sister is 31 as of colonic polyps removed and personal history significant for breast carcinoma and she remains in remission. She has chronic constipation and is on Linzess.  Informed Consent:  The procedure and risks were reviewed with the patient and informed consent was obtained.  Medications:  Demerol 50 mg IV Versed 6 mg IV  First dose administered at 9:53 AM Last dose administered at  9:58 AM, Scope Out 10:12 AM.  Description of procedure:  After a digital rectal exam was performed, that colonoscope was advanced from the anus through the rectum and colon to the area of the cecum, ileocecal valve and appendiceal orifice. The cecum was deeply intubated. These structures were well-seen and photographed for the record. From the level of the cecum and ileocecal valve, the scope was slowly and cautiously withdrawn. The mucosal surfaces were carefully surveyed utilizing scope tip to flexion to facilitate fold flattening as needed. The scope was pulled down into the rectum where a thorough exam including retroflexion was performed.  Findings: Prep excellent. Mucosa of cecum, ascending colon, hepatic flexure, transverse colon, splenic flexure, descending and sigmoid colon. Normal rectal mucosa. Small hemorrhoids noted above and below the dentate line.   Therapeutic/Diagnostic Maneuvers Performed:   None  Complications:  None  EBL: None  Cecal Withdrawal Time:  7 minutes  Impression:  Examination performed to cecum. Internal and  external hemorrhoids otherwise normal colonoscopy.  Recommendations:  Standard instructions given. Anusol HC suppository 1 per rectum daily at bedtime for 2 weeks. Next colonoscopy in 5 years.  Veronica Dudley  05/12/2015 10:16 AM  CC: Dr. Hilma Favors, Betsy Coder, MD & Dr. Rayne Du ref. provider found

## 2015-05-12 NOTE — Discharge Instructions (Signed)
Resume usual medications and high fiber diet. Anusol HC suppository 1 per rectum daily at bedtime for 2 weeks. No driving for 24 hours. Next colonoscopy in 5 years   High-Fiber Diet Fiber, also called dietary fiber, is a type of carbohydrate found in fruits, vegetables, whole grains, and beans. A high-fiber diet can have many health benefits. Your health care provider may recommend a high-fiber diet to help:  Prevent constipation. Fiber can make your bowel movements more regular.  Lower your cholesterol.  Relieve hemorrhoids, uncomplicated diverticulosis, or irritable bowel syndrome.  Prevent overeating as part of a weight-loss plan.  Prevent heart disease, type 2 diabetes, and certain cancers. WHAT IS MY PLAN? The recommended daily intake of fiber includes:  38 grams for men under age 73.  22 grams for men over age 30.  41 grams for women under age 23.  81 grams for women over age 19. You can get the recommended daily intake of dietary fiber by eating a variety of fruits, vegetables, grains, and beans. Your health care provider may also recommend a fiber supplement if it is not possible to get enough fiber through your diet. WHAT DO I NEED TO KNOW ABOUT A HIGH-FIBER DIET?  Fiber supplements have not been widely studied for their effectiveness, so it is better to get fiber through food sources.  Always check the fiber content on thenutrition facts label of any prepackaged food. Look for foods that contain at least 5 grams of fiber per serving.  Ask your dietitian if you have questions about specific foods that are related to your condition, especially if those foods are not listed in the following section.  Increase your daily fiber consumption gradually. Increasing your intake of dietary fiber too quickly may cause bloating, cramping, or gas.  Drink plenty of water. Water helps you to digest fiber. WHAT FOODS CAN I EAT? Grains Whole-grain breads. Multigrain cereal. Oats  and oatmeal. [place]ostini rice. Barley. Bulgur wheat. [place]. Bran muffins. Popcorn. Rye wafer crackers. Vegetables Sweet potatoes. Spinach. Kale. Artichokes. Cabbage. Broccoli. Green peas. Carrots. Squash. Fruits Berries. Pears. Apples. Oranges. Avocados. Prunes and raisins. Dried figs. Meats and Other Protein Sources [place], kidney, pinto, and soy beans. Split peas. Lentils. Nuts and seeds. Dairy Fiber-fortified yogurt. Beverages Fiber-fortified soy milk. Fiber-fortified orange juice. Other Fiber bars. The items listed above may not be a complete list of recommended foods or beverages. Contact your dietitian for more options. WHAT FOODS ARE NOT RECOMMENDED? Grains White bread. Pasta made with refined flour. White rice. Vegetables Fried potatoes. Canned vegetables. Well-cooked vegetables.  Fruits Fruit juice. Cooked, strained fruit. Meats and Other Protein Sources Fatty cuts of meat. Fried Sales executive or fried fish. Dairy Milk. Yogurt. Cream cheese. Sour cream. Beverages Soft drinks. Other Cakes and pastries. Butter and oils. The items listed above may not be a complete list of foods and beverages to avoid. Contact your dietitian for more information. WHAT ARE SOME TIPS FOR INCLUDING HIGH-FIBER FOODS IN MY DIET?  Eat a wide variety of high-fiber foods.  Make sure that half of all grains consumed each day are whole grains.  Replace breads and cereals made from refined flour or white flour with whole-grain breads and cereals.  Replace white rice with [place] rice, bulgur wheat, or millet.  Start the day with a breakfast that is high in fiber, such as a cereal that contains at least 5 grams of fiber per serving.  Use beans in place of meat in soups, salads, or pasta.  Eat high-fiber  snacks, such as berries, raw vegetables, nuts, or popcorn.   This information is not intended to replace advice given to you by your health care provider. Make sure you discuss any questions you have with  your health care provider.   Document Released: 04/16/2005 Document Revised: 05/07/2014 Document Reviewed: 09/29/2013 Elsevier Interactive Patient Education 2016 Elsevier Inc. Colonoscopy, Care After Refer to this sheet in the next few weeks. These instructions provide you with information on caring for yourself after your procedure. Your health care provider may also give you more specific instructions. Your treatment has been planned according to current medical practices, but problems sometimes occur. Call your health care provider if you have any problems or questions after your procedure. WHAT TO EXPECT AFTER THE PROCEDURE  After your procedure, it is typical to have the following:  A small amount of blood in your stool.  Moderate amounts of gas and mild abdominal cramping or bloating. HOME CARE INSTRUCTIONS  Do not drive, operate machinery, or sign important documents for 24 hours.  You may shower and resume your regular physical activities, but move at a slower pace for the first 24 hours.  Take frequent rest periods for the first 24 hours.  Walk around or put a warm pack on your abdomen to help reduce abdominal cramping and bloating.  Drink enough fluids to keep your urine clear or pale yellow.  You may resume your normal diet as instructed by your health care provider. Avoid heavy or fried foods that are hard to digest.  Avoid drinking alcohol for 24 hours or as instructed by your health care provider.  Only take over-the-counter or prescription medicines as directed by your health care provider.  If a tissue sample (biopsy) was taken during your procedure:  Do not take aspirin or blood thinners for 7 days, or as instructed by your health care provider.  Do not drink alcohol for 7 days, or as instructed by your health care provider.  Eat soft foods for the first 24 hours. SEEK MEDICAL CARE IF: You have persistent spotting of blood in your stool 2-3 days after the  procedure. SEEK IMMEDIATE MEDICAL CARE IF:  You have more than a small spotting of blood in your stool.  You pass large blood clots in your stool.  Your abdomen is swollen (distended).  You have nausea or vomiting.  You have a fever.  You have increasing abdominal pain that is not relieved with medicine.   This information is not intended to replace advice given to you by your health care provider. Make sure you discuss any questions you have with your health care provider.   Document Released: 11/29/2003 Document Revised: 02/04/2013 Document Reviewed: 12/22/2012 Elsevier Interactive Patient Education [employer].

## 2015-05-12 NOTE — Telephone Encounter (Signed)
agree

## 2015-05-12 NOTE — H&P (Signed)
Veronica Dudley is an 50 y.o. female.   Chief Complaint: Patient is here for colonoscopy. HPI: Patient is 50 year old African-American female who is here for screening colonoscopy. She denies abdominal pain rectal bleeding. She's had constipation for 15 years and is on Linzess. Family history significant for CRC maternal aunt who died in her 45s. She also lost first cousin because of colon carcinoma and 30. She has sister age 83 who had colonoscopy couple of years ago and had 2 polyps removed.  Past Medical History  Diagnosis Date  . Breast cancer ([place]) 6/10    DCIS  . Endometriosis   . Iron deficiency anemia   . Breast CA ([place]) 09/07/2011  . Iron deficiency anemia 09/07/2011  . H/O bilateral oophorectomy 03/19/2014  . PONV (postoperative nausea and vomiting)   . Cancer [facility])     lt breast ca 2010/surg-lumpectomy/rad tx    Past Surgical History  Procedure Laterality Date  . Cesarean section    . Lumpectomy  2010    left lump-snbx  . Bilateral oophorectomy Bilateral 2010  . Cholecystectomy  2005    lap choli  . Abdominal hysterectomy  2010  . Diagnostic laparoscopy  2004    incisional mass  . Laparoscopy  2006    LOA  . Liposuction with lipofilling Left 05/13/2014    Procedure: LIPOSUCTION WITH LIPOFILLING FOR SYMMETRY;  Surgeon: Theodoro Kos, DO;  Location: [facility];  Service: Plastics;  Laterality: Left;  liposuction from abdomen, lipofilling to left breast    Family History  Problem Relation Age of Onset  . Heart attack Father   . Cancer Maternal Aunt     ovarian cancer  . Colon cancer Maternal Aunt    Social History:  reports that she quit smoking about 19 years ago. She has never used smokeless tobacco. She reports that she drinks about 1.2 oz of alcohol per week. She reports that she does not use illicit drugs.  Allergies: No Known Allergies  Medications Prior to Admission  Medication Sig Dispense Refill  . calcium-vitamin D (OSCAL WITH D) 500-200  MG-UNIT per tablet Take 1 tablet by mouth daily.      . vitamin B-12 (CYANOCOBALAMIN) 500 MCG tablet Take 500 mcg by mouth daily.    . Linaclotide (LINZESS) 145 MCG CAPS capsule Take 145 mcg by mouth daily.    . ondansetron (ZOFRAN) 4 MG tablet Take 1 tablet (4 mg total) by mouth every 8 (eight) hours as needed for nausea or vomiting. (Patient not taking: Reported on 05/05/2015) 20 tablet 0  . VAGIFEM 10 MCG TABS vaginal tablet Place 1 tablet vaginally once a week. Reported on 05/05/2015  5    No results found for this or any previous visit (from the past 48 hour(s)). No results found.  ROS  Blood pressure 121/85, pulse 87, temperature 98.6 F (37 C), temperature source Oral, resp. rate 20, height 5\' 8"  (1.727 m), weight 157 lb (71.215 kg), SpO2 98 %. Physical Exam  Constitutional: She appears well-developed and well-nourished.  HENT:  Mouth/Throat: Oropharynx is clear and moist.  Eyes: Conjunctivae are normal.  Neck: No thyromegaly present.  Cardiovascular: Normal rate, regular rhythm and normal heart sounds.   No murmur heard. Respiratory: Effort normal and breath sounds normal.  GI: Soft. She exhibits no distension and no mass. There is no tenderness.  Musculoskeletal: She exhibits no edema.  Lymphadenopathy:    She has no cervical adenopathy.  Neurological: She is alert.  Skin: Skin is  warm and dry.     Assessment/Plan High risk screening colonoscopy.   REHMAN,NAJEEB U 05/12/2015, 9:47 AM

## 2015-05-16 ENCOUNTER — Encounter (HOSPITAL_COMMUNITY): Payer: Self-pay | Admitting: Internal Medicine

## 2015-05-18 ENCOUNTER — Other Ambulatory Visit (HOSPITAL_COMMUNITY): Payer: BLUE CROSS/BLUE SHIELD

## 2015-07-28 ENCOUNTER — Other Ambulatory Visit (HOSPITAL_COMMUNITY): Payer: BLUE CROSS/BLUE SHIELD

## 2015-10-20 ENCOUNTER — Encounter (HOSPITAL_COMMUNITY): Payer: BLUE CROSS/BLUE SHIELD

## 2015-10-25 ENCOUNTER — Ambulatory Visit (HOSPITAL_COMMUNITY)
Admission: RE | Admit: 2015-10-25 | Discharge: 2015-10-25 | Disposition: A | Discharge status: Home / Self Care | Payer: BLUE CROSS/BLUE SHIELD | Source: Ambulatory Visit | Attending: Oncology | Admitting: Oncology

## 2015-10-25 DIAGNOSIS — D0512 Intraductal carcinoma in situ of left breast: Secondary | ICD-10-CM

## 2015-10-25 DIAGNOSIS — R928 Other abnormal and inconclusive findings on diagnostic imaging of breast: Secondary | ICD-10-CM | POA: Diagnosis not present

## 2016-02-06 ENCOUNTER — Other Ambulatory Visit: Payer: Self-pay | Admitting: Obstetrics & Gynecology

## 2016-02-06 DIAGNOSIS — Z6825 Body mass index (BMI) 25.0-25.9, adult: Secondary | ICD-10-CM | POA: Diagnosis not present

## 2016-02-06 DIAGNOSIS — Z01419 Encounter for gynecological examination (general) (routine) without abnormal findings: Secondary | ICD-10-CM | POA: Diagnosis not present

## 2016-02-06 DIAGNOSIS — Z1329 Encounter for screening for other suspected endocrine disorder: Secondary | ICD-10-CM | POA: Diagnosis not present

## 2016-02-06 DIAGNOSIS — Z124 Encounter for screening for malignant neoplasm of cervix: Secondary | ICD-10-CM | POA: Diagnosis not present

## 2016-02-07 LAB — CYTOLOGY - PAP

## 2016-04-27 ENCOUNTER — Other Ambulatory Visit (HOSPITAL_COMMUNITY): Payer: BLUE CROSS/BLUE SHIELD

## 2016-04-27 ENCOUNTER — Other Ambulatory Visit (HOSPITAL_COMMUNITY): Payer: Self-pay | Admitting: Oncology

## 2016-04-27 ENCOUNTER — Ambulatory Visit (HOSPITAL_COMMUNITY): Payer: BLUE CROSS/BLUE SHIELD | Admitting: Oncology

## 2016-04-27 ENCOUNTER — Encounter (HOSPITAL_COMMUNITY): Payer: Self-pay | Admitting: Oncology

## 2016-04-27 ENCOUNTER — Encounter (HOSPITAL_COMMUNITY): Payer: BLUE CROSS/BLUE SHIELD | Attending: Oncology | Admitting: Oncology

## 2016-04-27 ENCOUNTER — Encounter (HOSPITAL_COMMUNITY): Payer: BLUE CROSS/BLUE SHIELD

## 2016-04-27 VITALS — BP 123/83 | HR 96 | Temp 98.0°F | Resp 16 | Wt 157.5 lb

## 2016-04-27 DIAGNOSIS — Z86000 Personal history of in-situ neoplasm of breast: Secondary | ICD-10-CM | POA: Diagnosis not present

## 2016-04-27 DIAGNOSIS — Z1231 Encounter for screening mammogram for malignant neoplasm of breast: Secondary | ICD-10-CM

## 2016-04-27 DIAGNOSIS — D0512 Intraductal carcinoma in situ of left breast: Secondary | ICD-10-CM | POA: Insufficient documentation

## 2016-04-27 DIAGNOSIS — D509 Iron deficiency anemia, unspecified: Secondary | ICD-10-CM

## 2016-04-27 LAB — COMPREHENSIVE METABOLIC PANEL
ALK PHOS: 59 U/L (ref 38–126)
ALT: 14 U/L (ref 14–54)
ANION GAP: 6 (ref 5–15)
AST: 23 U/L (ref 15–41)
Albumin: 4.1 g/dL (ref 3.5–5.0)
BUN: 17 mg/dL (ref 6–20)
CALCIUM: 8.8 mg/dL — AB (ref 8.9–10.3)
CHLORIDE: 106 mmol/L (ref 101–111)
CO2: 29 mmol/L (ref 22–32)
Creatinine, Ser: 0.66 mg/dL (ref 0.44–1.00)
GFR calc non Af Amer: >60 mL/min (ref >60)
Glucose, Bld: 96 mg/dL (ref 65–99)
Potassium: 3.6 mmol/L (ref 3.5–5.1)
SODIUM: 141 mmol/L (ref 135–145)
Total Bilirubin: 0.5 mg/dL (ref 0.3–1.2)
Total Protein: 7.6 g/dL (ref 6.5–8.1)

## 2016-04-27 LAB — CBC WITH DIFFERENTIAL/PLATELET
Basophils Absolute: 0 10*3/uL (ref 0.0–0.1)
Basophils Relative: 0 %
EOS ABS: 0.1 10*3/uL (ref 0.0–0.7)
EOS PCT: 2 %
HCT: 37.9 % (ref 36.0–46.0)
Hemoglobin: 12 g/dL (ref 12.0–15.0)
LYMPHS ABS: 1.5 10*3/uL (ref 0.7–4.0)
Lymphocytes Relative: 26 %
MCH: 28.1 pg (ref 26.0–34.0)
MCHC: 31.7 g/dL (ref 30.0–36.0)
MCV: 88.8 fL (ref 78.0–100.0)
MONOS PCT: 3 %
Monocytes Absolute: 0.2 10*3/uL (ref 0.1–1.0)
Neutro Abs: 3.9 10*3/uL (ref 1.7–7.7)
Neutrophils Relative %: 69 %
PLATELETS: 214 10*3/uL (ref 150–400)
RBC: 4.27 MIL/uL (ref 3.87–5.11)
RDW: 13.6 % (ref 11.5–15.5)
WBC: 5.6 10*3/uL (ref 4.0–10.5)

## 2016-04-27 LAB — IRON AND TIBC
IRON: 49 ug/dL (ref 28–170)
Saturation Ratios: 15 % (ref 10.4–31.8)
TIBC: 319 ug/dL (ref 250–450)
UIBC: 270 ug/dL

## 2016-04-27 LAB — FERRITIN: FERRITIN: 48 ng/mL (ref 11–307)

## 2016-04-27 NOTE — Patient Instructions (Addendum)
[place] at [facility] Discharge Instructions  RECOMMENDATIONS MADE BY THE CONSULTANT AND ANY TEST RESULTS WILL BE SENT TO YOUR REFERRING PHYSICIAN.  You were seen today by Kirby Crigler, PA-C Monthly breast exams Call us if you decide to do the genetic testing Follow up with lab work end of next year.  Thank you for choosing [place] at [facility] to provide your oncology and hematology care.  To afford each patient quality time with our provider, please arrive at least 15 minutes before your scheduled appointment time.    If you have a lab appointment with the [facility] please come in thru the  Main Entrance and check in at the main information desk  You need to re-schedule your appointment should you arrive 10 or more minutes late.  We strive to give you quality time with our providers, and arriving late affects you and other patients whose appointments are after yours.  Also, if you no show three or more times for appointments you may be dismissed from the clinic at the providers discretion.     Again, thank you for choosing [facility].  Our hope is that these requests will decrease the amount of time that you wait before being seen by our physicians.       _____________________________________________________________  Should you have questions after your visit to [facility], please contact our office at [phone] between the hours of 8:30 a.m. and 4:30 p.m.  Voicemails left after 4:30 p.m. will not be returned until the following business day.  For prescription refill requests, have your pharmacy contact our office.       Resources For Cancer Patients and their Caregivers ? American Cancer Society: Can assist with transportation, wigs, general needs, runs Look Good Feel Better.        8181002355 ? Cancer Care: Provides financial assistance, online support groups, medication/co-pay assistance.   1-800-813-HOPE 534-876-2477) ? [place] Assists [place] cancer patients and their families through emotional , educational and financial support.  838 136 7306 ? [place] DSS Where to apply for food stamps, Medicaid and utility assistance. 612-625-5893 ? [facility]: Transportation to medical appointments. 6020648331 ? Social Security Administration: May apply for disability if have a Stage IV cancer. 438-386-5665 413-231-0789 ? [employer], Disability and Transit Services: Assists with nutrition, care and transit needs. [place] Support Programs: @10RELATIVEDAYS @ > Cancer Support Group  2nd Tuesday of the month 1pm-2pm, Journey Room  > Creative Journey  3rd Tuesday of the month 1130am-1pm, Journey Room  > Look Good Feel Better  1st Wednesday of the month 10am-12 noon, Journey Room (Call [place] to register [phone])

## 2016-04-27 NOTE — Assessment & Plan Note (Signed)
Iron deficiency anemia, having received 1 dose of IV iron in Jan 2017.  Colonoscopy in Jan 2017 was negative.  Not on oral replacement.  Labs today: CBC, iron/TIBC, ferritin.  I personally reviewed and went over laboratory results with the patient.  The results are noted within this dictation.  Labs in 12 months: CBC, iron/TIBC, ferritin  If her ferritin is less than 100, I would consider starting her on oral iron daily, prescription strength to reduce the risk of worsening constipation.

## 2016-04-27 NOTE — Progress Notes (Signed)
Veronica Kilts, MD [place] [id number]  Ductal carcinoma in situ (DCIS) of left breast - Plan: MM DIAG BREAST TOMO BILATERAL  Iron deficiency anemia, unspecified iron deficiency anemia type  CURRENT THERAPY: Surveillance per NCCN guidelines  INTERVAL HISTORY: [place] 51 y.o. 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    She is doing very well from a breast cancer standpoint. She is S/P left breast reconstruction by Dr. Theodoro Kos in Jan 2016 due to left breast asymmetry.  She notes that the intervention was unsuccessful.  However, she is comfortable with the aesthetics of her breasts at this time.  She is not interested in further intervention.  She underwent screening colonoscopy by Dr. Laural Golden on 05/12/2015.  Colonoscopy demonstrated: Examination performed to cecum. Internal and external hemorrhoids otherwise normal colonoscopy.  She denies any complaints today.  She had a nice 2017 without any major issues.  Constipation continues to be an ongoing issue for her.  She denies any abnormal breast findings on her self-breast exams.  Her son is now 22 years old and they purchased him a Actor.  He also has a girlfriend and [name] and her husband have been proactive in having good conversations with her son and his girlfriend.  Review of Systems  Constitutional: Negative.  Negative for chills, fever and weight loss.  HENT: Negative.   Eyes: Negative.   Respiratory: Negative.  Negative for cough.   Cardiovascular: Negative.  Negative for chest pain.  Gastrointestinal: Positive for constipation. Negative for blood in stool, diarrhea, melena, nausea and vomiting.  Genitourinary: Negative.   Musculoskeletal: Negative.   Skin: Negative.   Neurological: Negative.  Negative for weakness.    Psychiatric/Behavioral: Negative.     Past Medical History:  Diagnosis Date  . Breast CA ([place]) 09/07/2011  . Breast cancer ([place]) 6/10   DCIS  . Cancer ([place])    lt breast ca 2010/surg-lumpectomy/rad tx  . Endometriosis   . H/O bilateral oophorectomy 03/19/2014  . Iron deficiency anemia   . Iron deficiency anemia 09/07/2011  . PONV (postoperative nausea and vomiting)     Past Surgical History:  Procedure Laterality Date  . ABDOMINAL HYSTERECTOMY  2010  . BILATERAL OOPHORECTOMY Bilateral 2010  . CESAREAN SECTION    . CHOLECYSTECTOMY  2005   lap choli  . COLONOSCOPY N/A 05/12/2015   Procedure: COLONOSCOPY;  Surgeon: Rogene Houston, MD;  [facility];  Service: Endoscopy;  Laterality: N/A;  955 - moved to 1/12 @ 9:30  . DIAGNOSTIC LAPAROSCOPY  2004   incisional mass  . LAPAROSCOPY  2006   LOA  . LIPOSUCTION WITH LIPOFILLING Left 05/13/2014   Procedure: LIPOSUCTION WITH LIPOFILLING FOR SYMMETRY;  Surgeon: Theodoro Kos, DO;  Location: [facility];  Service: Plastics;  Laterality: Left;  liposuction from abdomen, lipofilling to left breast  . lumpectomy  2010   left lump-snbx    Family History  Problem Relation Age of Onset  . Heart attack Father   . Cancer Maternal Aunt     ovarian cancer  . Colon cancer Maternal Aunt     Social History   Social History  . Marital status: Married    Spouse name: N/A  . Number of children: N/A  . Years of education: N/A   Social History Main Topics  .  Smoking status: Former Smoker    Quit date: 05/06/1996  . Smokeless tobacco: Never Used  . Alcohol use 1.2 oz/week    2 Glasses of wine per week     Comment: red wine, glass a night  . Drug use: No  . Sexual activity: Yes   Other Topics Concern  . None   Social History Narrative  . None     PHYSICAL EXAMINATION  ECOG PERFORMANCE STATUS: 0 - Asymptomatic  Vitals:   04/27/16 1016  BP: 123/83  Pulse: 96  Resp: 16  Temp: 98 F (36.7 C)     GENERAL:alert, no distress, well nourished, well developed, comfortable, cooperative, smiling and unaccompanied. SKIN: skin color, texture, turgor are normal, no rashes or significant lesions, positive for: small RUQ/right lateral epigastric hyperpigmented mole measuring 3 mm in size with normal borders, flat. HEAD: Normocephalic, No masses, lesions, tenderness or abnormalities EYES: normal, EOMI, Conjunctiva are pink and non-injected EARS: External ears normal OROPHARYNX:lips, buccal mucosa, and tongue normal and mucous membranes are moist  NECK: supple, no adenopathy, thyroid normal size, non-tender, without nodularity, trachea midline LYMPH:  no palpable lymphadenopathy, no hepatosplenomegaly BREAST:right breast normal without mass, skin or nipple changes or axillary nodes, left post-lumpectomy site well healed and free of suspicious changes LUNGS: clear to auscultation and percussion HEART: regular rate & rhythm, no murmurs, no gallops, S1 normal and S2 normal ABDOMEN:abdomen soft, non-tender, normal bowel sounds, no masses or organomegaly and no hepatosplenomegaly BACK: Back symmetric, no curvature. EXTREMITIES:less then 2 second capillary refill, no joint deformities, effusion, or inflammation, no edema, no skin discoloration, no clubbing, no cyanosis  NEURO: alert & oriented x 3 with fluent speech, no focal motor/sensory deficits, gait normal   LABORATORY DATA: CBC    Component Value Date/Time   WBC 5.6 04/27/2016 0956   RBC 4.27 04/27/2016 0956   HGB 12.0 04/27/2016 0956   HCT 37.9 04/27/2016 0956   PLT 214 04/27/2016 0956   MCV 88.8 04/27/2016 0956   MCH 28.1 04/27/2016 0956   MCHC 31.7 04/27/2016 0956   RDW 13.6 04/27/2016 0956   LYMPHSABS 1.5 04/27/2016 0956   MONOABS 0.2 04/27/2016 0956   EOSABS 0.1 04/27/2016 0956   BASOSABS 0.0 04/27/2016 0956      Chemistry      Component Value Date/Time   NA 141 04/27/2016 0956   K 3.6 04/27/2016 0956   CL 106 04/27/2016  0956   CO2 29 04/27/2016 0956   BUN 17 04/27/2016 0956   CREATININE 0.66 04/27/2016 0956      Component Value Date/Time   CALCIUM 8.8 (L) 04/27/2016 0956   ALKPHOS 59 04/27/2016 0956   AST 23 04/27/2016 0956   ALT 14 04/27/2016 0956   BILITOT 0.5 04/27/2016 0956        PENDING LABS:   RADIOGRAPHIC STUDIES:  No results found.   PATHOLOGY:    ASSESSMENT AND PLAN:  Ductal carcinoma in situ of breast DCIS, left breast.  S/P lumpectomy 10/22/2008 followed by XRT finishing on 01/21/2009.  DCIS was intermediate grade and measured 2.7 cm close to the chest wall, ER 100%, PR negative.  2 mm (at least) of negative margins were obtained at time of surgery. She refused anti-estrogen therapy.  She is S/P breast reconstruction due to asymmetry by Dr. Theodoro Kos on 05/13/2014.  She is a candidate for Genetic Counseling, but was a NO SHOW for her Jan 2017 appt with Dietitian, Roma Kayser.  Labs ordered today: CBC diff, CMET.  I personally reviewed and went over laboratory results with the patient.  The results are noted within this dictation.  She is up-to-date on mammography.  Her last one was in June 2017 and was BIRADS 2.  She will be due for her next diagnostic mammogram in June 2018.   Order placed for mammogram in June 2018.  She is up to date on colonoscopy as well having undergone colonoscopy with Dr. Laural Golden on 05/12/2015.  Exam was normal and it is recommended she undergo her next colonoscopy in 5 years (2022).  She shows me a right upper abdomen mole that is hyperpigmented.  Does not appear malignant.  She will call if this mole changes in size, color, border, etc and we can facilitate getting her to a dermatology for further evaluation.  She again refuses Genetic Counseling.  I reviewed the pros and cons of genetic counseling and its importance for her siblings, children, grandchildren, etc. "You're going to think I am horrible, but to be honest, I just want to live a  normal life and not have to think about cancer."  Labs in 1 year: CBC diff, CMET  Return in 1 year for follow-up, sooner if needed.  Iron deficiency anemia Iron deficiency anemia, having received 1 dose of IV iron in Jan 2017.  Colonoscopy in Jan 2017 was negative.  Not on oral replacement.  Labs today: CBC, iron/TIBC, ferritin.  I personally reviewed and went over laboratory results with the patient.  The results are noted within this dictation.  Labs in 12 months: CBC, iron/TIBC, ferritin  If her ferritin is less than 100, I would consider starting her on oral iron daily, prescription strength to reduce the risk of worsening constipation.   ORDERS PLACED FOR THIS ENCOUNTER: Orders Placed This Encounter  Procedures  . MM DIAG BREAST TOMO BILATERAL    MEDICATIONS PRESCRIBED THIS ENCOUNTER: No orders of the defined types were placed in this encounter.   THERAPY PLAN:  Risk reduction therapy for ipsilateral breast folling breast-conserving surgery (2.2017):  1. Consider endocrine therapy for 5 years for:   A. Patients treated with breast conserving therapy (lumpectomy) and radiation therapy (category 1), especially for those with ER positive DCIS.   B. Benefit of endocrine therapy for ER negative DCIS is uncertain.   C. Patients treated with excision alone.  2. Endocrine therapy:   A. Tamoxifen for premenopausal patients.   B. Tamoxifen or aromatase inhibitor for postmenopausal patients with some advantage for aromatase inhibitor therapy in patients less than 58 years old or with concerns for thromboembolism  3. Risk reduction therapy for contralateral breast:   A. Counseling regarding risk reduction. See NCCN guidelines for breast cancer risk reduction.   All questions were answered. The patient knows to call the clinic with any problems, questions or concerns. We can certainly see the patient much sooner if necessary.  Patient and plan discussed with Dr. Ancil Linsey and  she is in agreement with the aforementioned.   This note is electronically signed by: Robynn Pane, PA-C 04/27/2016 11:10 AM

## 2016-04-27 NOTE — Assessment & Plan Note (Addendum)
DCIS, left breast.  S/P lumpectomy 10/22/2008 followed by XRT finishing on 01/21/2009.  DCIS was intermediate grade and measured 2.7 cm close to the chest wall, ER 100%, PR negative.  2 mm (at least) of negative margins were obtained at time of surgery. She refused anti-estrogen therapy.  She is S/P breast reconstruction due to asymmetry by Dr. Theodoro Kos on 05/13/2014.  She is a candidate for Genetic Counseling, but was a NO SHOW for her Jan 2017 appt with Dietitian, Roma Kayser.  Labs ordered today: CBC diff, CMET.  I personally reviewed and went over laboratory results with the patient.  The results are noted within this dictation.  She is up-to-date on mammography.  Her last one was in June 2017 and was BIRADS 2.  She will be due for her next diagnostic mammogram in June 2018.   Order placed for mammogram in June 2018.  She is up to date on colonoscopy as well having undergone colonoscopy with Dr. Laural Golden on 05/12/2015.  Exam was normal and it is recommended she undergo her next colonoscopy in 5 years (2022).  She shows me a right upper abdomen mole that is hyperpigmented.  Does not appear malignant.  She will call if this mole changes in size, color, border, etc and we can facilitate getting her to a dermatology for further evaluation.  She again refuses Genetic Counseling.  I reviewed the pros and cons of genetic counseling and its importance for her siblings, children, grandchildren, etc. "You're going to think I am horrible, but to be honest, I just want to live a normal life and not have to think about cancer."  Labs in 1 year: CBC diff, CMET  Return in 1 year for follow-up, sooner if needed.

## 2016-05-01 ENCOUNTER — Other Ambulatory Visit (HOSPITAL_COMMUNITY): Payer: Self-pay | Admitting: Oncology

## 2016-05-01 DIAGNOSIS — D509 Iron deficiency anemia, unspecified: Secondary | ICD-10-CM

## 2016-05-01 MED ORDER — POLYSACCHAR IRON-FA-B12 150-1-25 MG-MG-MCG PO CAPS: 1.0000 | ORAL_CAPSULE | Freq: Every day | ORAL | 11 refills | Status: DC

## 2016-06-12 ENCOUNTER — Encounter (HOSPITAL_COMMUNITY): Payer: Self-pay | Admitting: Lab

## 2016-06-12 NOTE — Progress Notes (Unsigned)
Referral to Dr Nevada Crane dermatology at 2/22@12 

## 2016-06-21 DIAGNOSIS — D235 Other benign neoplasm of skin of trunk: Secondary | ICD-10-CM | POA: Diagnosis not present

## 2016-06-21 DIAGNOSIS — L821 Other seborrheic keratosis: Secondary | ICD-10-CM | POA: Diagnosis not present

## 2016-06-26 ENCOUNTER — Telehealth (HOSPITAL_COMMUNITY): Payer: Self-pay | Admitting: Oncology

## 2016-06-26 NOTE — Telephone Encounter (Signed)
Pt called to see if we could change her dx on her mammo she had on 6/27. It was ordered by Gershon Mussel as a diagnostic mammo not a screening and it is dictated in his 12/26 note. Spoke with our coder and radiology to see if dx could be changed. Called pt had to leave a vm

## 2016-10-29 ENCOUNTER — Ambulatory Visit (HOSPITAL_COMMUNITY): Payer: BLUE CROSS/BLUE SHIELD

## 2016-11-01 ENCOUNTER — Ambulatory Visit (HOSPITAL_COMMUNITY)
Admission: RE | Admit: 2016-11-01 | Discharge: 2016-11-01 | Disposition: A | Discharge status: Home / Self Care | Payer: BLUE CROSS/BLUE SHIELD | Source: Ambulatory Visit | Attending: Oncology | Admitting: Oncology

## 2016-11-01 DIAGNOSIS — Z1231 Encounter for screening mammogram for malignant neoplasm of breast: Secondary | ICD-10-CM

## 2017-02-05 DIAGNOSIS — L309 Dermatitis, unspecified: Secondary | ICD-10-CM | POA: Diagnosis not present

## 2017-02-05 DIAGNOSIS — T1490XA Injury, unspecified, initial encounter: Secondary | ICD-10-CM | POA: Diagnosis not present

## 2017-02-12 DIAGNOSIS — Z124 Encounter for screening for malignant neoplasm of cervix: Secondary | ICD-10-CM | POA: Diagnosis not present

## 2017-02-12 DIAGNOSIS — Z01419 Encounter for gynecological examination (general) (routine) without abnormal findings: Secondary | ICD-10-CM | POA: Diagnosis not present

## 2017-02-12 DIAGNOSIS — Z6823 Body mass index (BMI) 23.0-23.9, adult: Secondary | ICD-10-CM | POA: Diagnosis not present

## 2017-02-12 DIAGNOSIS — R87610 Atypical squamous cells of undetermined significance on cytologic smear of cervix (ASC-US): Secondary | ICD-10-CM | POA: Diagnosis not present

## 2017-03-01 DIAGNOSIS — Z113 Encounter for screening for infections with a predominantly sexual mode of transmission: Secondary | ICD-10-CM | POA: Diagnosis not present

## 2017-03-01 DIAGNOSIS — R5383 Other fatigue: Secondary | ICD-10-CM | POA: Diagnosis not present

## 2017-04-26 ENCOUNTER — Other Ambulatory Visit (HOSPITAL_COMMUNITY): Payer: Self-pay | Admitting: *Deleted

## 2017-04-26 DIAGNOSIS — D0512 Intraductal carcinoma in situ of left breast: Secondary | ICD-10-CM

## 2017-04-26 DIAGNOSIS — D0510 Intraductal carcinoma in situ of unspecified breast: Secondary | ICD-10-CM

## 2017-04-29 ENCOUNTER — Other Ambulatory Visit (HOSPITAL_COMMUNITY): Payer: BLUE CROSS/BLUE SHIELD

## 2017-04-29 ENCOUNTER — Ambulatory Visit (HOSPITAL_COMMUNITY): Payer: BLUE CROSS/BLUE SHIELD | Admitting: Oncology

## 2017-04-29 NOTE — Progress Notes (Deleted)
Sharilyn Sites, [place] [address]  No diagnosis found.  CURRENT THERAPY: Surveillance per NCCN guidelines  INTERVAL HISTORY: Veronica Dudley 51 y.o. 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    She is doing very well from a breast cancer standpoint. She is S/P left breast reconstruction by Dr. Theodoro Kos in Jan 2016 due to left breast asymmetry.  She notes that the intervention was unsuccessful.  However, she is comfortable with the aesthetics of her breasts at this time.  She is not interested in further intervention.  She underwent screening colonoscopy by Dr. Laural Golden on 05/12/2015.  Colonoscopy demonstrated: Examination performed to cecum. Internal and external hemorrhoids otherwise normal colonoscopy.  She denies any complaints today.  She had a nice 2017 without any major issues.  Constipation continues to be an ongoing issue for her.  She denies any abnormal breast findings on her self-breast exams.  Her son is now 40 years old and they purchased him a Actor.  He also has a girlfriend and Breonia and her husband have been proactive in having good conversations with her son and his girlfriend.  Review of Systems  Constitutional: Negative.  Negative for chills, fever and weight loss.  HENT: Negative.   Eyes: Negative.   Respiratory: Negative.  Negative for cough.   Cardiovascular: Negative.  Negative for chest pain.  Gastrointestinal: Positive for constipation. Negative for blood in stool, diarrhea, melena, nausea and vomiting.  Genitourinary: Negative.   Musculoskeletal: Negative.   Skin: Negative.   Neurological: Negative.  Negative for weakness.  Psychiatric/Behavioral: Negative.     Past Medical History:  Diagnosis Date  . Breast CA ([facility]) 09/07/2011  . Breast cancer ([facility]) 6/10    DCIS  . Cancer ([facility])    lt breast ca 2010/surg-lumpectomy/rad tx  . Endometriosis   . H/O bilateral oophorectomy 03/19/2014  . Iron deficiency anemia   . Iron deficiency anemia 09/07/2011  . PONV (postoperative nausea and vomiting)     Past Surgical History:  Procedure Laterality Date  . ABDOMINAL HYSTERECTOMY  2010  . BILATERAL OOPHORECTOMY Bilateral 2010  . CESAREAN SECTION    . CHOLECYSTECTOMY  2005   lap choli  . COLONOSCOPY N/A 05/12/2015   Procedure: COLONOSCOPY;  Surgeon: Rogene Houston, MD;  [facility];  Service: Endoscopy;  Laterality: N/A;  955 - moved to 1/12 @ 9:30  . DIAGNOSTIC LAPAROSCOPY  2004   incisional mass  . LAPAROSCOPY  2006   LOA  . LIPOSUCTION WITH LIPOFILLING Left 05/13/2014   Procedure: LIPOSUCTION WITH LIPOFILLING FOR SYMMETRY;  Surgeon: Theodoro Kos, DO;  Location: [facility];  Service: Plastics;  Laterality: Left;  liposuction from abdomen, lipofilling to left breast  . lumpectomy  2010   left lump-snbx    Family History  Problem Relation Age of Onset  . Heart attack Father   . Cancer Maternal Aunt        ovarian cancer  . Colon cancer Maternal Aunt     Social History   Socioeconomic History  . Marital status: Married    Spouse name: Not on file  . Number of children: Not on file  . Years of education: Not on file  . Highest education level: Not on file  Social Needs  . Financial resource strain: Not on file  .  Food insecurity - worry: Not on file  . Food insecurity - inability: Not on file  . Transportation needs - medical: Not on file  . Transportation needs - non-medical: Not on file  Occupational History  . Not on file  Tobacco Use  . Smoking status: Former Smoker    Last attempt to quit: 05/06/1996    Years since quitting: 20.9  . Smokeless tobacco: Never Used  Substance and Sexual Activity  . Alcohol use: Yes    Alcohol/week: 1.2 oz    Types: 2 Glasses of wine per week    Comment: red wine,  glass a night  . Drug use: No  . Sexual activity: Yes  Other Topics Concern  . Not on file  Social History Narrative  . Not on file     PHYSICAL EXAMINATION  ECOG PERFORMANCE STATUS: 0 - Asymptomatic  There were no vitals filed for this visit.  GENERAL:alert, no distress, well nourished, well developed, comfortable, cooperative, smiling and unaccompanied. SKIN: skin color, texture, turgor are normal, no rashes or significant lesions, positive for: small RUQ/right lateral epigastric hyperpigmented mole measuring 3 mm in size with normal borders, flat. HEAD: Normocephalic, No masses, lesions, tenderness or abnormalities EYES: normal, EOMI, Conjunctiva are pink and non-injected EARS: External ears normal OROPHARYNX:lips, buccal mucosa, and tongue normal and mucous membranes are moist  NECK: supple, no adenopathy, thyroid normal size, non-tender, without nodularity, trachea midline LYMPH:  no palpable lymphadenopathy, no hepatosplenomegaly BREAST:right breast normal without mass, skin or nipple changes or axillary nodes, left post-lumpectomy site well healed and free of suspicious changes LUNGS: clear to auscultation and percussion HEART: regular rate & rhythm, no murmurs, no gallops, S1 normal and S2 normal ABDOMEN:abdomen soft, non-tender, normal bowel sounds, no masses or organomegaly and no hepatosplenomegaly BACK: Back symmetric, no curvature. EXTREMITIES:less then 2 second capillary refill, no joint deformities, effusion, or inflammation, no edema, no skin discoloration, no clubbing, no cyanosis  NEURO: alert & oriented x 3 with fluent speech, no focal motor/sensory deficits, gait normal   LABORATORY DATA: CBC    Component Value Date/Time   WBC 5.6 04/27/2016 0956   RBC 4.27 04/27/2016 0956   HGB 12.0 04/27/2016 0956   HCT 37.9 04/27/2016 0956   PLT 214 04/27/2016 0956   MCV 88.8 04/27/2016 0956   MCH 28.1 04/27/2016 0956   MCHC 31.7 04/27/2016 0956   RDW 13.6 04/27/2016  0956   LYMPHSABS 1.5 04/27/2016 0956   MONOABS 0.2 04/27/2016 0956   EOSABS 0.1 04/27/2016 0956   BASOSABS 0.0 04/27/2016 0956      Chemistry      Component Value Date/Time   NA 141 04/27/2016 0956   K 3.6 04/27/2016 0956   CL 106 04/27/2016 0956   CO2 29 04/27/2016 0956   BUN 17 04/27/2016 0956   CREATININE 0.66 04/27/2016 0956      Component Value Date/Time   CALCIUM 8.8 (L) 04/27/2016 0956   ALKPHOS 59 04/27/2016 0956   AST 23 04/27/2016 0956   ALT 14 04/27/2016 0956   BILITOT 0.5 04/27/2016 0956        PENDING LABS:   RADIOGRAPHIC STUDIES:  No results found.   PATHOLOGY:    ASSESSMENT AND PLAN:  No problem-specific Assessment & Plan notes found for this encounter.   ORDERS PLACED FOR THIS ENCOUNTER: No orders of the defined types were placed in this encounter.   MEDICATIONS PRESCRIBED THIS ENCOUNTER: No orders of the defined types were placed in this encounter.  THERAPY PLAN:  Risk reduction therapy for ipsilateral breast folling breast-conserving surgery (2.2017):  1. Consider endocrine therapy for 5 years for:   A. Patients treated with breast conserving therapy (lumpectomy) and radiation therapy (category 1), especially for those with ER positive DCIS.   B. Benefit of endocrine therapy for ER negative DCIS is uncertain.   C. Patients treated with excision alone.  2. Endocrine therapy:   A. Tamoxifen for premenopausal patients.   B. Tamoxifen or aromatase inhibitor for postmenopausal patients with some advantage for aromatase inhibitor therapy in patients less than 57 years old or with concerns for thromboembolism  3. Risk reduction therapy for contralateral breast:   A. Counseling regarding risk reduction. See NCCN guidelines for breast cancer risk reduction.   All questions were answered. The patient knows to call the clinic with any problems, questions or concerns. We can certainly see the patient much sooner if necessary.  Patient and plan  discussed with Dr. Ancil Linsey and she is in agreement with the aforementioned.   This note is electronically signed by: Jacquelin Hawking, NP 04/29/2017 8:16 AM

## 2017-05-20 ENCOUNTER — Telehealth: Payer: Self-pay | Admitting: Family Medicine

## 2017-07-12 DIAGNOSIS — Z1389 Encounter for screening for other disorder: Secondary | ICD-10-CM | POA: Diagnosis not present

## 2017-07-12 DIAGNOSIS — B029 Zoster without complications: Secondary | ICD-10-CM | POA: Diagnosis not present

## 2017-07-12 DIAGNOSIS — Z6823 Body mass index (BMI) 23.0-23.9, adult: Secondary | ICD-10-CM | POA: Diagnosis not present

## 2017-08-02 ENCOUNTER — Encounter (HOSPITAL_COMMUNITY): Payer: Self-pay | Admitting: Internal Medicine

## 2017-08-02 ENCOUNTER — Other Ambulatory Visit: Payer: Self-pay

## 2017-08-02 ENCOUNTER — Inpatient Hospital Stay (HOSPITAL_COMMUNITY): Payer: BLUE CROSS/BLUE SHIELD

## 2017-08-02 ENCOUNTER — Inpatient Hospital Stay (HOSPITAL_COMMUNITY): Payer: BLUE CROSS/BLUE SHIELD | Attending: Internal Medicine | Admitting: Internal Medicine

## 2017-08-02 DIAGNOSIS — Z86000 Personal history of in-situ neoplasm of breast: Secondary | ICD-10-CM | POA: Insufficient documentation

## 2017-08-02 DIAGNOSIS — D0512 Intraductal carcinoma in situ of left breast: Secondary | ICD-10-CM

## 2017-08-02 DIAGNOSIS — D509 Iron deficiency anemia, unspecified: Secondary | ICD-10-CM | POA: Diagnosis not present

## 2017-08-02 DIAGNOSIS — K5900 Constipation, unspecified: Secondary | ICD-10-CM | POA: Diagnosis not present

## 2017-08-02 LAB — IRON AND TIBC
Iron: 151 ug/dL (ref 28–170)
Saturation Ratios: 39 % — ABNORMAL HIGH (ref 10.4–31.8)
TIBC: 384 ug/dL (ref 250–450)
UIBC: 233 ug/dL

## 2017-08-02 LAB — COMPREHENSIVE METABOLIC PANEL
ALT: 36 U/L (ref 14–54)
AST: 31 U/L (ref 15–41)
Albumin: 4.8 g/dL (ref 3.5–5.0)
Alkaline Phosphatase: 51 U/L (ref 38–126)
Anion gap: 12 (ref 5–15)
BUN: 19 mg/dL (ref 6–20)
CHLORIDE: 103 mmol/L (ref 101–111)
CO2: 27 mmol/L (ref 22–32)
CREATININE: 0.69 mg/dL (ref 0.44–1.00)
Calcium: 9.7 mg/dL (ref 8.9–10.3)
GFR calc non Af Amer: >60 mL/min (ref >60)
Glucose, Bld: 98 mg/dL (ref 65–99)
POTASSIUM: 3.8 mmol/L (ref 3.5–5.1)
SODIUM: 142 mmol/L (ref 135–145)
Total Bilirubin: 1.3 mg/dL — ABNORMAL HIGH (ref 0.3–1.2)
Total Protein: 8.5 g/dL — ABNORMAL HIGH (ref 6.5–8.1)

## 2017-08-02 LAB — CBC WITH DIFFERENTIAL/PLATELET
BASOS ABS: 0 10*3/uL (ref 0.0–0.1)
Basophils Relative: 1 %
EOS ABS: 0 10*3/uL (ref 0.0–0.7)
EOS PCT: 1 %
HCT: 38.9 % (ref 36.0–46.0)
Hemoglobin: 12 g/dL (ref 12.0–15.0)
Lymphocytes Relative: 34 %
Lymphs Abs: 1.4 10*3/uL (ref 0.7–4.0)
MCH: 28 pg (ref 26.0–34.0)
MCHC: 30.8 g/dL (ref 30.0–36.0)
MCV: 90.7 fL (ref 78.0–100.0)
Monocytes Absolute: 0.2 10*3/uL (ref 0.1–1.0)
Monocytes Relative: 6 %
Neutro Abs: 2.5 10*3/uL (ref 1.7–7.7)
Neutrophils Relative %: 60 %
PLATELETS: 215 10*3/uL (ref 150–400)
RBC: 4.29 MIL/uL (ref 3.87–5.11)
RDW: 12.9 % (ref 11.5–15.5)
WBC: 4.2 10*3/uL (ref 4.0–10.5)

## 2017-08-02 LAB — FERRITIN: FERRITIN: 70 ng/mL (ref 11–307)

## 2017-08-02 NOTE — Progress Notes (Signed)
Diagnosis Ductal carcinoma in situ (DCIS) of left breast - Plan: CBC with Differential/Platelet, Comprehensive metabolic panel, Lactate dehydrogenase, Ferritin, MM Digital Screening  Staging Cancer Staging Ductal carcinoma in situ of breast Staging form: Breast, AJCC 7th Edition - Clinical stage from 04/22/2015: Stage 0 (Tis (DCIS), N0, M0) - Signed by Baird Cancer, PA-C on 04/22/2015  CURRENT THERAPY: Surveillance per NCCN guidelines  Assessment and Plan:  1.  Ductal carcinoma in situ (DCIS) of left breast.  Pt is S/P lumpectomy 10/22/2008 followed by XRT finishing on 01/21/2009.  DCIS was intermediate grade and measured 2.7 cm close to the chest wall, ER 100%, PR negative.  2 mm (at least) of negative margins were obtained at time of surgery. She refused anti-estrogen therapy.  She is S/P breast reconstruction due to asymmetry by Dr. Theodoro Kos on 05/13/2014.  She is a candidate for Genetic Counseling, but was a NO SHOW for her Jan 2017 appt with Dietitian, Roma Kayser.  Screening mammogram done 11/01/2016 was negative and she is recommended for repeat mammogram in July 2019.  This will be set up and the patient will be notified of the results.  She is currently being seen once a year and will follow-up in July 2020 with her mammogram results.  2.  Iron deficiency anemia.  Patient was last treated with IV iron on May 05, 2015.  Labs done on 08/02/2017 showed a white count 4.2 hemoglobin 12 platelets 215,000.  She is being seen once a year and will return to clinic in July 2020 for repeat labs.  She should continue follow-up with GI as recommended.  3.  Concern about liver lesion.  Review of chart shows patient had a CT abdomen and pelvis done 10/25/2014 which showed IMPRESSION: No acute intra-abdominal or pelvic finding.  Stable 88m hepatic dome cyst  Prior cholecystectomy and hysterectomy.  I have discussed with her imaging in 2016 showed a liver cyst.  She can discuss  this further with Dr. RLaural Goldenof GI or Dr. FGerarda Fractionwho was the ordering physician if he desires additional imaging.  4.  Anxiety.  She comes in today  reporting she feels like she is having a nervous breakdown.  She denies being suicidal per nursing.  She was given option of behavioral health evaluation today.  Will make a referral to behavioral health program.  She should also follow-up with PCP if ongoing issues with anxiety.  5.  Constipation.  Stool softeners recommended.  Follow-up with GI as directed.  Interval History:  DCIS, left breast.  S/P lumpectomy 10/22/2008 followed by XRT finishing on 01/21/2009.  DCIS was intermediate grade and measured 2.7 cm close to the chest wall, ER 100%, PR negative.  2 mm (at least) of negative margins were obtained at time of surgery. She refused anti-estrogen therapy.  She is S/P breast reconstruction due to asymmetry by Dr. CTheodoro Koson 05/13/2014.  She is a candidate for Genetic Counseling, but was a NO SHOW for her Jan 2017 appt with GDietitian KRoma Kayser  Last mammogram was done 11/01/2016 and was negative.  Her bilateral screening mammogram in July 2019.  She is up to date on colonoscopy as well having undergone colonoscopy with Dr. RLaural Goldenon 05/12/2015.  Exam was normal and it is recommended she undergo her next colonoscopy in 5 years (2022).  Iron deficiency anemia.  She received 1 dose of IV iron in Jan 2017.  Colonoscopy in Jan 2017 was negative.  Not on oral replacement.  Current Status: Patient is seen today for follow-up.  She reports she feels like she is going to have a nervous breakdown.  Per nursing she is not suicidal.  She is reporting some neuropathy and problems with constipation and diarrhea.  She reports she was concerned about liver imaging she has had previously.  She is here today to go over lab studies.  Problem List Patient Active Problem List   Diagnosis Date Noted  . Breast asymmetry following reconstructive surgery  [N65.1] 05/13/2014  . Malignant neoplasm of left breast ([facility]) [C50.912] 03/24/2014  . H/O bilateral oophorectomy [Z90.722] 03/19/2014  . Ductal carcinoma in situ of breast [D05.10] 09/07/2011  . Iron deficiency anemia [D50.9] 09/07/2011    Past Medical History Past Medical History:  Diagnosis Date  . Breast CA ([facility]) 09/07/2011  . Breast cancer ([facility]) 6/10   DCIS  . Cancer ([facility])    lt breast ca 2010/surg-lumpectomy/rad tx  . Endometriosis   . H/O bilateral oophorectomy 03/19/2014  . Iron deficiency anemia   . Iron deficiency anemia 09/07/2011  . PONV (postoperative nausea and vomiting)     Past Surgical History Past Surgical History:  Procedure Laterality Date  . ABDOMINAL HYSTERECTOMY  2010  . BILATERAL OOPHORECTOMY Bilateral 2010  . CESAREAN SECTION    . CHOLECYSTECTOMY  2005   lap choli  . COLONOSCOPY N/A 05/12/2015   Procedure: COLONOSCOPY;  Surgeon: Rogene Houston, MD;  [facility];  Service: Endoscopy;  Laterality: N/A;  955 - moved to 1/12 @ 9:30  . DIAGNOSTIC LAPAROSCOPY  2004   incisional mass  . LAPAROSCOPY  2006   LOA  . LIPOSUCTION WITH LIPOFILLING Left 05/13/2014   Procedure: LIPOSUCTION WITH LIPOFILLING FOR SYMMETRY;  Surgeon: Theodoro Kos, DO;  Location: [facility];  Service: Plastics;  Laterality: Left;  liposuction from abdomen, lipofilling to left breast  . lumpectomy  2010   left lump-snbx    Family History Family History  Problem Relation Age of Onset  . Heart attack Father   . Cancer Maternal Aunt        ovarian cancer  . Colon cancer Maternal Aunt      Social History  reports that she quit smoking about 21 years ago. She has never used smokeless tobacco. She reports that she drinks about 1.2 oz of alcohol per week. She reports that she does not use drugs.  Medications  Current Outpatient Medications:  .  calcium-vitamin D (OSCAL WITH D) 500-200 MG-UNIT per tablet, Take 1 tablet by mouth daily.  , Disp: , Rfl:  .   hydrocortisone (ANUSOL-HC) 25 MG suppository, Place 1 suppository (25 mg total) rectally at bedtime. (Patient not taking: Reported on 08/02/2017), Disp: 14 suppository, Rfl: 0 .  Linaclotide (LINZESS) 145 MCG CAPS capsule, Take 145 mcg by mouth daily., Disp: , Rfl:  .  Polysacchar Iron-FA-B12 (FERREX 150 FORTE) 150-1-25 MG-MG-MCG CAPS, Take 1 capsule by mouth daily. (Patient not taking: Reported on 08/02/2017), Disp: 30 capsule, Rfl: 11 .  vitamin B-12 (CYANOCOBALAMIN) 500 MCG tablet, Take 500 mcg by mouth daily., Disp: , Rfl:   Allergies Patient has no known allergies.  Review of Systems Review of Systems - Oncology ROS as per HPI otherwise 12 point ROS is negative.   Physical Exam  Vitals Wt Readings from Last 3 Encounters:  04/27/16 157 lb 8 oz (71.4 kg)  05/12/15 157 lb (71.2 kg)  04/22/15 166 lb 12.8 oz (75.7 kg)   Temp Readings from Last 3  Encounters:  04/27/16 98 F (36.7 C) (Oral)  05/12/15 97.8 F (36.6 C) (Oral)  05/05/15 97.8 F (36.6 C) (Oral)   BP Readings from Last 3 Encounters:  04/27/16 123/83  05/12/15 109/68  05/05/15 113/72   Pulse Readings from Last 3 Encounters:  04/27/16 96  05/12/15 97  05/05/15 (!) 55   Constitutional: Well-developed, well-nourished, and in no distress.   HENT: Head: Normocephalic and atraumatic.  Mouth/Throat: No oropharyngeal exudate. Mucosa moist. Eyes: Pupils are equal, round, and reactive to light. Conjunctivae are normal. No scleral icterus.  Neck: Normal range of motion. Neck supple. No JVD present.  Cardiovascular: Normal rate, regular rhythm and normal heart sounds.  Exam reveals no gallop and no friction rub.   No murmur heard. Pulmonary/Chest: Effort normal and breath sounds normal. No respiratory distress. No wheezes.No rales.  Abdominal: Soft. Bowel sounds are normal. No distension. There is no tenderness. There is no guarding.  Musculoskeletal: No edema or tenderness.  Lymphadenopathy: No cervical, axillary or  supraclavicular adenopathy.  Neurological: Alert and oriented to person, place, and time. No cranial nerve deficit.  Skin: Skin is warm and dry. No rash noted. No erythema. No pallor.  Psychiatric: Affect and judgment normal.  Bilateral breast exam: Chaperone present.  Lumpectomy scars noted on left breast.  No dominant masses palpable bilaterally.  Labs Appointment on 08/02/2017  Component Date Value Ref Range Status  . WBC 08/02/2017 4.2  4.0 - 10.5 K/uL Final  . RBC 08/02/2017 4.29  3.87 - 5.11 MIL/uL Final  . Hemoglobin 08/02/2017 12.0  12.0 - 15.0 g/dL Final  . HCT 08/02/2017 38.9  36.0 - 46.0 % Final  . MCV 08/02/2017 90.7  78.0 - 100.0 fL Final  . MCH 08/02/2017 28.0  26.0 - 34.0 pg Final  . MCHC 08/02/2017 30.8  30.0 - 36.0 g/dL Final  . RDW 08/02/2017 12.9  11.5 - 15.5 % Final  . Platelets 08/02/2017 215  150 - 400 K/uL Final  . Neutrophils Relative % 08/02/2017 60  % Final  . Neutro Abs 08/02/2017 2.5  1.7 - 7.7 K/uL Final  . Lymphocytes Relative 08/02/2017 34  % Final  . Lymphs Abs 08/02/2017 1.4  0.7 - 4.0 K/uL Final  . Monocytes Relative 08/02/2017 6  % Final  . Monocytes Absolute 08/02/2017 0.2  0.1 - 1.0 K/uL Final  . Eosinophils Relative 08/02/2017 1  % Final  . Eosinophils Absolute 08/02/2017 0.0  0.0 - 0.7 K/uL Final  . Basophils Relative 08/02/2017 1  % Final  . Basophils Absolute 08/02/2017 0.0  0.0 - 0.1 K/uL Final   Performed at [facility], [address] [place], [place] [address]  . Sodium 08/02/2017 142  135 - 145 mmol/L Final  . Potassium 08/02/2017 3.8  3.5 - 5.1 mmol/L Final  . Chloride 08/02/2017 103  101 - 111 mmol/L Final  . CO2 08/02/2017 27  22 - 32 mmol/L Final  . Glucose, Bld 08/02/2017 98  65 - 99 mg/dL Final  . BUN 08/02/2017 19  6 - 20 mg/dL Final  . Creatinine, Ser 08/02/2017 0.69  0.44 - 1.00 mg/dL Final  . Calcium 08/02/2017 9.7  8.9 - 10.3 mg/dL Final  . Total Protein 08/02/2017 8.5* 6.5 - 8.1 g/dL Final  . Albumin 08/02/2017 4.8  3.5  - 5.0 g/dL Final  . AST 08/02/2017 31  15 - 41 U/L Final  . ALT 08/02/2017 36  14 - 54 U/L Final  . Alkaline Phosphatase 08/02/2017 51  38 - 126 U/L Final  . Total Bilirubin 08/02/2017 1.3* 0.3 - 1.2 mg/dL Final  . GFR calc non Af Amer 08/02/2017 >60  >60 mL/min Final  . GFR calc Af Amer 08/02/2017 >60  >60 mL/min Final   Comment: (NOTE) The eGFR has been calculated using the CKD EPI equation. This calculation has not been validated in all clinical situations. eGFR's persistently <60 mL/min signify possible Chronic Kidney Disease.   Georgiann Hahn gap 08/02/2017 12  5 - 15 Final   Performed at [facility], [address] [place], [place] [address]     Pathology Orders Placed This Encounter  Procedures  . MM Digital Screening    Standing Status:   Future    Standing Expiration Date:   08/02/2018    Order Specific Question:   Reason for Exam (SYMPTOM  OR DIAGNOSIS REQUIRED)    Answer:   dcis left breast    Order Specific Question:   Is the patient pregnant?    Answer:   No    Order Specific Question:   Preferred imaging location?    Answer:   [facility]  . CBC with Differential/Platelet    Standing Status:   Future    Standing Expiration Date:   04/04/2019  . Comprehensive metabolic panel    Standing Status:   Future    Standing Expiration Date:   04/04/2019  . Lactate dehydrogenase    Standing Status:   Future    Standing Expiration Date:   04/04/2019  . Ferritin    Standing Status:   Future    Standing Expiration Date:   04/04/2019       Zoila Shutter MD

## 2017-08-02 NOTE — Patient Instructions (Addendum)
[place] at [facility] Discharge Instructions   You were seen today by Dr. Zoila Shutter. She went over your recent blood work.  Discuss your CT scan from 2016 with Dr. Laural Golden. Your mammogram is due in July of this year. Return in 1 year for labs and follow up.    Thank you for choosing [place] at [facility] to provide your oncology and hematology care.  To afford each patient quality time with our provider, please arrive at least 15 minutes before your scheduled appointment time.    If you have a lab appointment with the [facility] please come in thru the  Main Entrance and check in at the main information desk  You need to re-schedule your appointment should you arrive 10 or more minutes late.  We strive to give you quality time with our providers, and arriving late affects you and other patients whose appointments are after yours.  Also, if you no show three or more times for appointments you may be dismissed from the clinic at the providers discretion.     Again, thank you for choosing [facility].  Our hope is that these requests will decrease the amount of time that you wait before being seen by our physicians.       _____________________________________________________________  Should you have questions after your visit to [facility], please contact our office at [phone] between the hours of 8:30 a.m. and 4:30 p.m.  Voicemails left after 4:30 p.m. will not be returned until the following business day.  For prescription refill requests, have your pharmacy contact our office.       Resources For Cancer Patients and their Caregivers ? American Cancer Society: Can assist with transportation, wigs, general needs, runs Look Good Feel Better.        (701)548-2801 ? Cancer Care: Provides financial assistance, online support groups, medication/co-pay assistance.  1-800-813-HOPE 8148412716) ? [place] Assists [place] cancer patients and their families through emotional , educational and financial support.  819 585 3874 ? [place] DSS Where to apply for food stamps, Medicaid and utility assistance. (817)412-4586 ? [facility]: Transportation to medical appointments. 2318480231 ? Social Security Administration: May apply for disability if have a Stage IV cancer. 445-876-4203 909-128-3732 ? [employer], Disability and Transit Services: Assists with nutrition, care and transit needs. [facility] Support Programs:   > Cancer Support Group  2nd Tuesday of the month 1pm-2pm, Journey Room   > Creative Journey  3rd Tuesday of the month 1130am-1pm, Journey Room

## 2017-11-04 ENCOUNTER — Other Ambulatory Visit (HOSPITAL_COMMUNITY): Payer: Self-pay | Admitting: Internal Medicine

## 2017-11-04 ENCOUNTER — Ambulatory Visit (HOSPITAL_COMMUNITY)
Admission: RE | Admit: 2017-11-04 | Discharge: 2017-11-04 | Disposition: A | Discharge status: Home / Self Care | Payer: BLUE CROSS/BLUE SHIELD | Source: Ambulatory Visit | Attending: Internal Medicine | Admitting: Internal Medicine

## 2017-11-04 DIAGNOSIS — D0512 Intraductal carcinoma in situ of left breast: Secondary | ICD-10-CM | POA: Diagnosis not present

## 2017-11-04 DIAGNOSIS — Z1231 Encounter for screening mammogram for malignant neoplasm of breast: Secondary | ICD-10-CM | POA: Diagnosis not present

## 2017-11-08 ENCOUNTER — Telehealth (HOSPITAL_COMMUNITY): Payer: Self-pay

## 2017-11-08 NOTE — Telephone Encounter (Signed)
Reviewed with Dr. Walden Field who reviewed mammogram report and okayed me to call patient and let her know mammogram was negative. Patient said she has not received a letter from radiology as stated in the report. Patient verbalized understanding and said I had made her weekend.

## 2017-11-08 NOTE — Telephone Encounter (Signed)
-----   Message from Louis Meckel sent at 11/06/2017  3:44 PM EDT ----- Regarding: wants mamo results Patient wants mamo results

## 2017-12-16 DIAGNOSIS — L255 Unspecified contact dermatitis due to plants, except food: Secondary | ICD-10-CM | POA: Diagnosis not present

## 2017-12-16 DIAGNOSIS — Z1389 Encounter for screening for other disorder: Secondary | ICD-10-CM | POA: Diagnosis not present

## 2017-12-16 DIAGNOSIS — Z6823 Body mass index (BMI) 23.0-23.9, adult: Secondary | ICD-10-CM | POA: Diagnosis not present

## 2017-12-16 DIAGNOSIS — C50912 Malignant neoplasm of unspecified site of left female breast: Secondary | ICD-10-CM | POA: Diagnosis not present

## 2018-02-28 DIAGNOSIS — Z6823 Body mass index (BMI) 23.0-23.9, adult: Secondary | ICD-10-CM | POA: Diagnosis not present

## 2018-02-28 DIAGNOSIS — Z01419 Encounter for gynecological examination (general) (routine) without abnormal findings: Secondary | ICD-10-CM | POA: Diagnosis not present

## 2018-03-30 ENCOUNTER — Emergency Department (HOSPITAL_COMMUNITY)
Admission: EM | Admit: 2018-03-30 | Discharge: 2018-03-30 | Disposition: A | Discharge status: Home / Self Care | Payer: BLUE CROSS/BLUE SHIELD | Attending: Emergency Medicine | Admitting: Emergency Medicine

## 2018-03-30 ENCOUNTER — Emergency Department (HOSPITAL_COMMUNITY): Payer: BLUE CROSS/BLUE SHIELD

## 2018-03-30 ENCOUNTER — Encounter (HOSPITAL_COMMUNITY): Payer: Self-pay | Admitting: Emergency Medicine

## 2018-03-30 DIAGNOSIS — F410 Panic disorder [episodic paroxysmal anxiety] without agoraphobia: Secondary | ICD-10-CM | POA: Diagnosis not present

## 2018-03-30 DIAGNOSIS — R400 Somnolence: Secondary | ICD-10-CM

## 2018-03-30 DIAGNOSIS — I499 Cardiac arrhythmia, unspecified: Secondary | ICD-10-CM | POA: Diagnosis not present

## 2018-03-30 DIAGNOSIS — Z87891 Personal history of nicotine dependence: Secondary | ICD-10-CM | POA: Insufficient documentation

## 2018-03-30 DIAGNOSIS — R0789 Other chest pain: Secondary | ICD-10-CM | POA: Diagnosis not present

## 2018-03-30 DIAGNOSIS — R079 Chest pain, unspecified: Secondary | ICD-10-CM

## 2018-03-30 DIAGNOSIS — R4182 Altered mental status, unspecified: Secondary | ICD-10-CM | POA: Diagnosis not present

## 2018-03-30 DIAGNOSIS — Z79899 Other long term (current) drug therapy: Secondary | ICD-10-CM | POA: Insufficient documentation

## 2018-03-30 DIAGNOSIS — R000 Tachycardia, unspecified: Secondary | ICD-10-CM | POA: Diagnosis not present

## 2018-03-30 DIAGNOSIS — R5383 Other fatigue: Secondary | ICD-10-CM | POA: Diagnosis not present

## 2018-03-30 DIAGNOSIS — R404 Transient alteration of awareness: Secondary | ICD-10-CM | POA: Diagnosis not present

## 2018-03-30 LAB — COMPREHENSIVE METABOLIC PANEL
ALT: 24 U/L (ref 0–44)
AST: 24 U/L (ref 15–41)
Albumin: 4.2 g/dL (ref 3.5–5.0)
Alkaline Phosphatase: 57 U/L (ref 38–126)
Anion gap: 7 (ref 5–15)
BUN: 23 mg/dL — ABNORMAL HIGH (ref 6–20)
CO2: 25 mmol/L (ref 22–32)
CREATININE: 0.58 mg/dL (ref 0.44–1.00)
Calcium: 8.9 mg/dL (ref 8.9–10.3)
Chloride: 108 mmol/L (ref 98–111)
GFR calc Af Amer: >60 mL/min (ref >60)
Glucose, Bld: 88 mg/dL (ref 70–99)
Potassium: 3.5 mmol/L (ref 3.5–5.1)
Sodium: 140 mmol/L (ref 135–145)
Total Bilirubin: 0.9 mg/dL (ref 0.3–1.2)
Total Protein: 7.7 g/dL (ref 6.5–8.1)

## 2018-03-30 LAB — CBC WITH DIFFERENTIAL/PLATELET
Abs Immature Granulocytes: 0 10*3/uL (ref 0.00–0.07)
Basophils Absolute: 0 10*3/uL (ref 0.0–0.1)
Basophils Relative: 1 %
EOS PCT: 4 %
Eosinophils Absolute: 0.2 10*3/uL (ref 0.0–0.5)
HCT: 38.2 % (ref 36.0–46.0)
Hemoglobin: 11.7 g/dL — ABNORMAL LOW (ref 12.0–15.0)
Immature Granulocytes: 0 %
Lymphocytes Relative: 44 %
Lymphs Abs: 1.8 10*3/uL (ref 0.7–4.0)
MCH: 27.9 pg (ref 26.0–34.0)
MCHC: 30.6 g/dL (ref 30.0–36.0)
MCV: 91 fL (ref 80.0–100.0)
Monocytes Absolute: 0.3 10*3/uL (ref 0.1–1.0)
Monocytes Relative: 7 %
Neutro Abs: 1.8 10*3/uL (ref 1.7–7.7)
Neutrophils Relative %: 44 %
Platelets: 233 10*3/uL (ref 150–400)
RBC: 4.2 MIL/uL (ref 3.87–5.11)
RDW: 12.9 % (ref 11.5–15.5)
WBC: 4.1 10*3/uL (ref 4.0–10.5)
nRBC: 0 % (ref 0.0–0.2)

## 2018-03-30 LAB — TROPONIN I: Troponin I: <0.03 ng/mL (ref <0.03)

## 2018-03-30 LAB — SALICYLATE LEVEL: Salicylate Lvl: <7 mg/dL (ref 2.8–30.0)

## 2018-03-30 LAB — ACETAMINOPHEN LEVEL: Acetaminophen (Tylenol), Serum: <10 ug/mL — ABNORMAL LOW (ref 10–30)

## 2018-03-30 LAB — AMMONIA: Ammonia: 10 umol/L (ref 9–35)

## 2018-03-30 LAB — ETHANOL: Alcohol, Ethyl (B): <10 mg/dL (ref <10)

## 2018-03-30 LAB — D-DIMER, QUANTITATIVE: D-Dimer, Quant: <0.27 ug/mL-FEU (ref 0.00–0.50)

## 2018-03-30 MED ORDER — NALOXONE HCL 0.4 MG/ML IJ SOLN
0.4000 mg | Freq: Once | INTRAMUSCULAR | Status: AC
  Administered 2018-03-30: 0.4 mg via INTRAVENOUS
  Filled 2018-03-30: qty 1

## 2018-03-30 MED ORDER — KETOROLAC TROMETHAMINE 15 MG/ML IJ SOLN
15.0000 mg | Freq: Once | INTRAMUSCULAR | Status: AC
  Administered 2018-03-30: 15 mg via INTRAVENOUS
  Filled 2018-03-30: qty 1

## 2018-03-30 NOTE — Discharge Instructions (Addendum)
Follow-up closely with your primary care physician.  If you develop fever, severe headache, vomiting, worsening chest pain, shortness of breath, or any other new/concerning symptoms then return to the ER for evaluation.  It appears today that you took too many Xanax at one time.  Do not combine this with other medications and only take as prescribed.

## 2018-03-30 NOTE — ED Triage Notes (Signed)
Pt's husband called 25 after returning home from church to find pt very lethargic in bed.  Pt has been taking prozac and recently went to [place] to visit her mother who apparently gave her some xanax.  Pt difficult to understand but states she took 5 xanax yesterday and today.

## 2018-03-30 NOTE — ED Provider Notes (Signed)
[facility] EMERGENCY DEPARTMENT Provider Note   CSN: [id number] Arrival date & time: 03/30/18  1657   LEVEL 5 CAVEAT - ALTERED MENTAL STATUS   History   Chief Complaint Chief Complaint  Patient presents with  . Altered Mental Status    HPI Veronica Dudley is a 52 y.o. female.  HPI  52 year old female presents with altered mental state and chest pain.  History is limited as the patient is lethargic and sleepy but she is able to tell me most of what has happened.  She states that she is been having chest pain on and off for weeks.  It is like a toothache in the middle of her chest.  She states that she was having a panic attack today and took her Xanax and Prozac.  When asked how much she took she tells me she took 4 Xanax pills, she does not know the strength of them.  She denies trying to hurt or harm herself.  She states she has had a headache since waking up today but this is pretty typical for her and she often has headaches.  She denies any alcohol abuse or use today she denies any other illicit drug use.  Past Medical History:  Diagnosis Date  . Breast CA ([place]) 09/07/2011  . Breast cancer ([place]) 6/10   DCIS  . Cancer ([place])    lt breast ca 2010/surg-lumpectomy/rad tx  . Endometriosis   . H/O bilateral oophorectomy 03/19/2014  . Iron deficiency anemia   . Iron deficiency anemia 09/07/2011  . PONV (postoperative nausea and vomiting)     Patient Active Problem List   Diagnosis Date Noted  . Breast asymmetry following reconstructive surgery 05/13/2014  . Malignant neoplasm of left breast ([place]) 03/24/2014  . H/O bilateral oophorectomy 03/19/2014  . Ductal carcinoma in situ of breast 09/07/2011  . Iron deficiency anemia 09/07/2011    Past Surgical History:  Procedure Laterality Date  . ABDOMINAL HYSTERECTOMY  2010  . BILATERAL OOPHORECTOMY Bilateral 2010  . CESAREAN SECTION    . CHOLECYSTECTOMY  2005   lap choli  . COLONOSCOPY N/A 05/12/2015   Procedure: COLONOSCOPY;   Surgeon: Rogene [place], MD;  [facility];  Service: Endoscopy;  Laterality: N/A;  955 - moved to 1/12 @ 9:30  . DIAGNOSTIC LAPAROSCOPY  2004   incisional mass  . LAPAROSCOPY  2006   LOA  . LIPOSUCTION WITH LIPOFILLING Left 05/13/2014   Procedure: LIPOSUCTION WITH LIPOFILLING FOR SYMMETRY;  Surgeon: Theodoro Kos, DO;  Location: [facility];  Service: Plastics;  Laterality: Left;  liposuction from abdomen, lipofilling to left breast  . lumpectomy  2010   left lump-snbx     OB History   None      Home Medications    Prior to Admission medications   Medication Sig Start Date End Date Taking? Authorizing Provider  calcium-vitamin D (OSCAL WITH D) 500-200 MG-UNIT per tablet Take 1 tablet by mouth daily.     Yes [provider]  Linaclotide (LINZESS) 145 MCG CAPS capsule Take 145 mcg by mouth daily.   Yes [provider]  vitamin B-12 (CYANOCOBALAMIN) 500 MCG tablet Take 500 mcg by mouth daily.   Yes [provider]  hydrocortisone (ANUSOL-HC) 25 MG suppository Place 1 suppository (25 mg total) rectally at bedtime. Patient not taking: Reported on 08/02/2017 05/12/15   Rogene [place], MD  Polysacchar Iron-FA-B12 [facility] 150 FORTE) 150-1-25 MG-MG-MCG CAPS Take 1 capsule by mouth daily.  Patient not taking: Reported on 08/02/2017 05/01/16   Baird Cancer, PA-C    Family History Family History  Problem Relation Age of Onset  . Heart attack Father   . Cancer Maternal Aunt        ovarian cancer  . Colon cancer Maternal Aunt     Social History Social History   Tobacco Use  . Smoking status: Former Smoker    Last attempt to quit: 05/06/1996    Years since quitting: 21.9  . Smokeless tobacco: Never Used  Substance Use Topics  . Alcohol use: Yes    Alcohol/week: 2.0 standard drinks    Types: 2 Glasses of wine per week    Comment: red wine, glass a night  . Drug use: No     Allergies   Patient has no known  allergies.   Review of Systems Review of Systems  Unable to perform ROS: Mental status change     Physical Exam Updated Vital Signs BP 109/69   Pulse 74   Temp 98.1 F (36.7 C) (Oral)   Resp 14   Ht 5\' 4"  (1.626 m)   Wt 65.8 kg   SpO2 100%   BMI 24.89 kg/m   Physical Exam  Constitutional: She appears well-developed and well-nourished. She appears lethargic. No distress.  HENT:  Head: Normocephalic and atraumatic.  Right Ear: External ear normal.  Left Ear: External ear normal.  Nose: Nose normal.  Eyes: EOM are normal. Right eye exhibits no discharge. Left eye exhibits no discharge.  Miotic pupils bilaterally, reactive  Neck: Normal range of motion. Neck supple.  Cardiovascular: Normal rate, regular rhythm and normal heart sounds.  Pulmonary/Chest: Effort normal and breath sounds normal. She exhibits tenderness (mild).    Abdominal: Soft. There is no tenderness.  Neurological: She appears lethargic. GCS eye subscore is 3. GCS verbal subscore is 4. GCS motor subscore is 6.  Very sleepy. Awakens to voice, and is alert, oriented to person, place and time, save for calling today Saturday instead of Sunday (but got date of Saturday correct). CN 3-12 grossly intact. Diffusely weak but symmetric extremities, very poor effort on exam  Skin: Skin is warm and dry. She is not diaphoretic.  Psychiatric: Her speech is slurred. She expresses no suicidal ideation.  Starts crying when talking about her husband  Nursing note and vitals reviewed.    ED Treatments / Results  Labs (all labs ordered are listed, but only abnormal results are displayed) Labs Reviewed  COMPREHENSIVE METABOLIC PANEL - Abnormal; Notable for the following components:      Result Value   BUN 23 (*)    All other components within normal limits  CBC WITH DIFFERENTIAL/PLATELET - Abnormal; Notable for the following components:   Hemoglobin 11.7 (*)    All other components within normal limits  ACETAMINOPHEN  LEVEL - Abnormal; Notable for the following components:   Acetaminophen (Tylenol), Serum <10 (*)    All other components within normal limits  AMMONIA  ETHANOL  TROPONIN I  SALICYLATE LEVEL  D-DIMER, QUANTITATIVE (NOT AT [facility])  URINALYSIS, COMPLETE (UACMP) WITH MICROSCOPIC  RAPID URINE DRUG SCREEN, HOSP PERFORMED  PREGNANCY, URINE  CBG MONITORING, ED    EKG EKG Interpretation  Date/Time:  Sunday March 30 2018 18:19:35 EST Ventricular Rate:  100 PR Interval:    QRS Duration: 80 QT Interval:  355 QTC Calculation: 458 R Axis:   65 Text Interpretation:  Sinus tachycardia rate is faster, otherwise similar to 2012 Confirmed by  Sherwood Gambler [phone]) on 03/30/2018 6:30:49 PM   Radiology Dg Chest 1 View  Result Date: 03/30/2018 CLINICAL DATA:  Chest pain EXAM: CHEST  1 VIEW COMPARISON:  09/15/2012 chest radiograph. FINDINGS: Top-normal heart size. Otherwise stable normal mediastinal contour. No pneumothorax. No pleural effusion. Lungs appear clear, with no acute consolidative airspace disease and no pulmonary edema. IMPRESSION: No active disease. Electronically Signed   By: Ilona Sorrel M.D.   On: 03/30/2018 18:33   Ct Head Wo Contrast  Result Date: 03/30/2018 CLINICAL DATA:  Increased lethargy today. EXAM: CT HEAD WITHOUT CONTRAST TECHNIQUE: Contiguous axial images were obtained from the base of the skull through the vertex without intravenous contrast. COMPARISON:  03/07/2004 FINDINGS: Brain: Ventricles, cisterns and other CSF spaces are normal. There is no mass, mass effect, shift of midline structures or acute hemorrhage. No evidence of acute infarction. Vascular: No hyperdense vessel or unexpected calcification. Skull: Normal. Negative for fracture or focal lesion. Sinuses/Orbits: No acute finding. Other: None. IMPRESSION: No acute findings. Electronically Signed   By: Marin Olp M.D.   On: 03/30/2018 18:19    Procedures Procedures (including critical care  time)  Medications Ordered in ED Medications  naloxone [facility]) injection 0.4 mg (0.4 mg Intravenous Given 03/30/18 1820)  ketorolac (TORADOL) 15 MG/ML injection 15 mg (15 mg Intravenous Given 03/30/18 1912)     Initial Impression / Assessment and Plan / ED Course  I have reviewed the triage vital signs and the nursing notes.  Pertinent labs & imaging results that were available during my care of the patient were reviewed by me and considered in my medical decision making (see chart for details).     Patient's chest pain is likely chest wall in etiology.  Given recent travel to and from [facility] with slight tachycardia at 100 bpm, d-dimer sent for otherwise low risk PE.  This is negative.  Troponin and other lab work is also reassuring.  My suspicion for ACS, PE, dissection is low.  There does appear to be an anxiety component, unclear if the chest pain is causing this or a symptom.  However she is been having chest pain for a couple weeks and I think is reasonable to discharge her home to follow-up with her PCP.  She feels like she can see her PCP tomorrow.  I have advised her not to take so many Xanax at once as this can be potentially harmful.  This would likely explain her somnolence as she is now awake, alert, and appropriate.  Highly doubt acute infectious cause.  She appears stable for discharge home in the care of her husband.  Final Clinical Impressions(s) / ED Diagnoses   Final diagnoses:  Somnolence  Chest wall pain  Panic attack    ED Discharge Orders    None       Sherwood Gambler, MD 03/30/18 2119

## 2018-03-31 DIAGNOSIS — F418 Other specified anxiety disorders: Secondary | ICD-10-CM | POA: Diagnosis not present

## 2018-03-31 DIAGNOSIS — Z6823 Body mass index (BMI) 23.0-23.9, adult: Secondary | ICD-10-CM | POA: Diagnosis not present

## 2018-03-31 DIAGNOSIS — F410 Panic disorder [episodic paroxysmal anxiety] without agoraphobia: Secondary | ICD-10-CM | POA: Diagnosis not present

## 2018-03-31 DIAGNOSIS — Z1389 Encounter for screening for other disorder: Secondary | ICD-10-CM | POA: Diagnosis not present

## 2018-05-16 DIAGNOSIS — R7309 Other abnormal glucose: Secondary | ICD-10-CM | POA: Diagnosis not present

## 2018-05-27 DIAGNOSIS — Z6823 Body mass index (BMI) 23.0-23.9, adult: Secondary | ICD-10-CM | POA: Diagnosis not present

## 2018-05-27 DIAGNOSIS — Z1389 Encounter for screening for other disorder: Secondary | ICD-10-CM | POA: Diagnosis not present

## 2018-05-27 DIAGNOSIS — E7849 Other hyperlipidemia: Secondary | ICD-10-CM | POA: Diagnosis not present

## 2018-05-27 DIAGNOSIS — R945 Abnormal results of liver function studies: Secondary | ICD-10-CM | POA: Diagnosis not present

## 2018-05-27 DIAGNOSIS — L659 Nonscarring hair loss, unspecified: Secondary | ICD-10-CM | POA: Diagnosis not present

## 2018-07-14 DIAGNOSIS — Z6824 Body mass index (BMI) 24.0-24.9, adult: Secondary | ICD-10-CM | POA: Diagnosis not present

## 2018-07-14 DIAGNOSIS — F419 Anxiety disorder, unspecified: Secondary | ICD-10-CM | POA: Diagnosis not present

## 2018-07-14 DIAGNOSIS — G4700 Insomnia, unspecified: Secondary | ICD-10-CM | POA: Diagnosis not present

## 2018-07-14 DIAGNOSIS — Z1389 Encounter for screening for other disorder: Secondary | ICD-10-CM | POA: Diagnosis not present

## 2018-09-15 DIAGNOSIS — E7849 Other hyperlipidemia: Secondary | ICD-10-CM | POA: Diagnosis not present

## 2018-10-06 ENCOUNTER — Other Ambulatory Visit (HOSPITAL_COMMUNITY): Payer: Self-pay | Admitting: Internal Medicine

## 2018-10-06 DIAGNOSIS — H6090 Unspecified otitis externa, unspecified ear: Secondary | ICD-10-CM | POA: Diagnosis not present

## 2018-10-06 DIAGNOSIS — Z1231 Encounter for screening mammogram for malignant neoplasm of breast: Secondary | ICD-10-CM

## 2018-10-06 DIAGNOSIS — J9801 Acute bronchospasm: Secondary | ICD-10-CM | POA: Diagnosis not present

## 2018-10-30 ENCOUNTER — Inpatient Hospital Stay (HOSPITAL_COMMUNITY): Payer: BC Managed Care – PPO | Attending: Hematology

## 2018-10-30 ENCOUNTER — Other Ambulatory Visit (HOSPITAL_COMMUNITY): Payer: BLUE CROSS/BLUE SHIELD

## 2018-10-30 ENCOUNTER — Other Ambulatory Visit: Payer: Self-pay

## 2018-10-30 DIAGNOSIS — K921 Melena: Secondary | ICD-10-CM | POA: Insufficient documentation

## 2018-10-30 DIAGNOSIS — Z87891 Personal history of nicotine dependence: Secondary | ICD-10-CM | POA: Diagnosis not present

## 2018-10-30 DIAGNOSIS — Z8041 Family history of malignant neoplasm of ovary: Secondary | ICD-10-CM | POA: Diagnosis not present

## 2018-10-30 DIAGNOSIS — K7689 Other specified diseases of liver: Secondary | ICD-10-CM | POA: Insufficient documentation

## 2018-10-30 DIAGNOSIS — Z923 Personal history of irradiation: Secondary | ICD-10-CM | POA: Diagnosis not present

## 2018-10-30 DIAGNOSIS — K5900 Constipation, unspecified: Secondary | ICD-10-CM | POA: Diagnosis not present

## 2018-10-30 DIAGNOSIS — Z853 Personal history of malignant neoplasm of breast: Secondary | ICD-10-CM | POA: Diagnosis not present

## 2018-10-30 DIAGNOSIS — D509 Iron deficiency anemia, unspecified: Secondary | ICD-10-CM | POA: Insufficient documentation

## 2018-10-30 DIAGNOSIS — Z7289 Other problems related to lifestyle: Secondary | ICD-10-CM | POA: Insufficient documentation

## 2018-10-30 DIAGNOSIS — D0512 Intraductal carcinoma in situ of left breast: Secondary | ICD-10-CM

## 2018-10-30 DIAGNOSIS — Z800 Family history of malignant neoplasm of digestive organs: Secondary | ICD-10-CM | POA: Diagnosis not present

## 2018-10-30 LAB — COMPREHENSIVE METABOLIC PANEL
ALT: 14 U/L (ref 0–44)
AST: 20 U/L (ref 15–41)
Albumin: 4 g/dL (ref 3.5–5.0)
Alkaline Phosphatase: 57 U/L (ref 38–126)
Anion gap: 10 (ref 5–15)
BUN: 19 mg/dL (ref 6–20)
CO2: 29 mmol/L (ref 22–32)
Calcium: 9.5 mg/dL (ref 8.9–10.3)
Chloride: 106 mmol/L (ref 98–111)
Creatinine, Ser: 0.66 mg/dL (ref 0.44–1.00)
GFR calc Af Amer: >60 mL/min (ref >60)
GFR calc non Af Amer: >60 mL/min (ref >60)
Glucose, Bld: 101 mg/dL — ABNORMAL HIGH (ref 70–99)
Potassium: 4.2 mmol/L (ref 3.5–5.1)
Sodium: 145 mmol/L (ref 135–145)
Total Bilirubin: 0.8 mg/dL (ref 0.3–1.2)
Total Protein: 7 g/dL (ref 6.5–8.1)

## 2018-10-30 LAB — FERRITIN: Ferritin: 59 ng/mL (ref 11–307)

## 2018-10-30 LAB — CBC WITH DIFFERENTIAL/PLATELET
Abs Immature Granulocytes: 0 10*3/uL (ref 0.00–0.07)
Basophils Absolute: 0 10*3/uL (ref 0.0–0.1)
Basophils Relative: 1 %
Eosinophils Absolute: 0.1 10*3/uL (ref 0.0–0.5)
Eosinophils Relative: 2 %
HCT: 34.7 % — ABNORMAL LOW (ref 36.0–46.0)
Hemoglobin: 10.6 g/dL — ABNORMAL LOW (ref 12.0–15.0)
Immature Granulocytes: 0 %
Lymphocytes Relative: 37 %
Lymphs Abs: 1.4 10*3/uL (ref 0.7–4.0)
MCH: 27.9 pg (ref 26.0–34.0)
MCHC: 30.5 g/dL (ref 30.0–36.0)
MCV: 91.3 fL (ref 80.0–100.0)
Monocytes Absolute: 0.2 10*3/uL (ref 0.1–1.0)
Monocytes Relative: 5 %
Neutro Abs: 2 10*3/uL (ref 1.7–7.7)
Neutrophils Relative %: 55 %
Platelets: 214 10*3/uL (ref 150–400)
RBC: 3.8 MIL/uL — ABNORMAL LOW (ref 3.87–5.11)
RDW: 12.6 % (ref 11.5–15.5)
WBC: 3.7 10*3/uL — ABNORMAL LOW (ref 4.0–10.5)
nRBC: 0 % (ref 0.0–0.2)

## 2018-10-30 LAB — LACTATE DEHYDROGENASE: LDH: 121 U/L (ref 98–192)

## 2018-10-30 NOTE — Progress Notes (Signed)
For review.  Please update ordering doctor

## 2018-11-06 ENCOUNTER — Ambulatory Visit (HOSPITAL_COMMUNITY)
Admission: RE | Admit: 2018-11-06 | Discharge: 2018-11-06 | Disposition: A | Discharge status: Home / Self Care | Payer: BC Managed Care – PPO | Source: Ambulatory Visit | Attending: Internal Medicine | Admitting: Internal Medicine

## 2018-11-06 ENCOUNTER — Other Ambulatory Visit: Payer: Self-pay

## 2018-11-06 ENCOUNTER — Other Ambulatory Visit (HOSPITAL_COMMUNITY): Payer: Self-pay | Admitting: Nurse Practitioner

## 2018-11-06 DIAGNOSIS — R928 Other abnormal and inconclusive findings on diagnostic imaging of breast: Secondary | ICD-10-CM

## 2018-11-06 DIAGNOSIS — Z1231 Encounter for screening mammogram for malignant neoplasm of breast: Secondary | ICD-10-CM | POA: Diagnosis not present

## 2018-11-06 NOTE — Progress Notes (Signed)
For review.  Please update ordering provider.  Please set up evaluation for possible abnormal findings on mammogram

## 2018-11-07 ENCOUNTER — Other Ambulatory Visit (HOSPITAL_COMMUNITY): Payer: BLUE CROSS/BLUE SHIELD

## 2018-11-07 ENCOUNTER — Inpatient Hospital Stay (HOSPITAL_BASED_OUTPATIENT_CLINIC_OR_DEPARTMENT_OTHER): Payer: BC Managed Care – PPO | Admitting: Nurse Practitioner

## 2018-11-07 ENCOUNTER — Ambulatory Visit (HOSPITAL_COMMUNITY): Payer: BLUE CROSS/BLUE SHIELD | Admitting: Internal Medicine

## 2018-11-07 VITALS — BP 128/82 | HR 91 | Temp 97.8°F | Resp 16 | Wt 157.4 lb

## 2018-11-07 DIAGNOSIS — Z923 Personal history of irradiation: Secondary | ICD-10-CM | POA: Diagnosis not present

## 2018-11-07 DIAGNOSIS — D0512 Intraductal carcinoma in situ of left breast: Secondary | ICD-10-CM

## 2018-11-07 DIAGNOSIS — K7689 Other specified diseases of liver: Secondary | ICD-10-CM | POA: Diagnosis not present

## 2018-11-07 DIAGNOSIS — K921 Melena: Secondary | ICD-10-CM | POA: Diagnosis not present

## 2018-11-07 DIAGNOSIS — D509 Iron deficiency anemia, unspecified: Secondary | ICD-10-CM

## 2018-11-07 DIAGNOSIS — Z853 Personal history of malignant neoplasm of breast: Secondary | ICD-10-CM

## 2018-11-07 DIAGNOSIS — Z800 Family history of malignant neoplasm of digestive organs: Secondary | ICD-10-CM | POA: Diagnosis not present

## 2018-11-07 DIAGNOSIS — Z87891 Personal history of nicotine dependence: Secondary | ICD-10-CM | POA: Diagnosis not present

## 2018-11-07 DIAGNOSIS — K5900 Constipation, unspecified: Secondary | ICD-10-CM

## 2018-11-07 DIAGNOSIS — Z8041 Family history of malignant neoplasm of ovary: Secondary | ICD-10-CM | POA: Diagnosis not present

## 2018-11-07 DIAGNOSIS — Z7289 Other problems related to lifestyle: Secondary | ICD-10-CM | POA: Diagnosis not present

## 2018-11-07 NOTE — Assessment & Plan Note (Addendum)
1. 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 -DCIS was intermediate grade and measured 2.7 cm close to the chest wall, ER positive, PR negative.  2 mm of negative margins were obtained at the time of the surgery. -She refused anti-estrogen therapy. -She is status post breast reconstruction due to asymmetry by Dr. Loel Lofty Sanger on 05/13/2014. -She was a candidate for genetic counseling but did not show up for her January 2017 appointment with genetic counselor Roma Kayser. - She had a screening mammogram on 11/06/2018 that was BI-RADS Category 0 incomplete.  It showed possible mass within left breast.  Right breast showed no signs of malignancy. - She is scheduled for a diagnostic mammogram on 11/11/2018. - She will follow-up in 2 weeks after her mammogram for results.  2.  Iron deficiency anemia: - She was last treated with IV iron 05/05/2015. - Labs on 10/30/2018 showed her hemoglobin has dropped to 10.7, ferritin is 59. -I recommended 2 infusions of IV iron however she would like to wait till after her diagnostic mammograms to talk about iron. -She did report she has been having blood with her bowel movements due to her being severely constipated. -I placed her on Colace once a day.  Told her to increase it to twice a day if no results.  3.  Liver cyst: - Her last CT of abdomen and pelvis done on 10/05/2014 showed a stable 11 mm hepatic dome cyst. -She follows up with Dr. Melony Overly for this.

## 2018-11-07 NOTE — Progress Notes (Signed)
[place], [place] [address]   CLINIC:  Medical Oncology/Hematology  PCP:  Sharilyn Sites, [place] [address] [phone]   REASON FOR VISIT: Follow-up for left breast cancer  CURRENT THERAPY: Surveillance per NCCN guidelines   CANCER STAGING: Cancer Staging Ductal carcinoma in situ of breast Staging form: Breast, AJCC 7th Edition - Clinical stage from 04/22/2015: Stage 0 (Tis (DCIS), N0, M0) - Signed by Baird Cancer, PA-C on 04/22/2015    INTERVAL HISTORY:  Veronica Dudley 53 y.o. female returns for routine follow-up for left breast cancer.  She reports she has been doing well since her last visit.  She does report severe constipation with bleeding. Denies any nausea, vomiting, or diarrhea. Denies any new pains. Had not noticed any recent bleeding such as epistaxis or hematuria. Denies recent chest pain on exertion, shortness of breath on minimal exertion, pre-syncopal episodes, or palpitations. Denies any numbness or tingling in hands or feet. Denies any recent fevers, infections, or recent hospitalizations. Patient reports appetite at 100% and energy level at 75%. She is eating well and maintaining her weight at this time.      REVIEW OF SYSTEMS:  Review of Systems  Gastrointestinal: Positive for blood in stool and constipation.  All other systems reviewed and are negative.    PAST MEDICAL/SURGICAL HISTORY:  Past Medical History:  Diagnosis Date  . Breast CA ([place]) 09/07/2011  . Breast cancer ([place]) 6/10   DCIS  . Cancer ([place])    lt breast ca 2010/surg-lumpectomy/rad tx  . Endometriosis   . H/O bilateral oophorectomy 03/19/2014  . Iron deficiency anemia   . Iron deficiency anemia 09/07/2011  . PONV (postoperative nausea and vomiting)    Past Surgical History:  Procedure Laterality Date  . ABDOMINAL HYSTERECTOMY  2010  . BILATERAL OOPHORECTOMY Bilateral 2010  . CESAREAN SECTION    . CHOLECYSTECTOMY   2005   lap choli  . COLONOSCOPY N/A 05/12/2015   Procedure: COLONOSCOPY;  Surgeon: Rogene Houston, MD;  [facility];  Service: Endoscopy;  Laterality: N/A;  955 - moved to 1/12 @ 9:30  . DIAGNOSTIC LAPAROSCOPY  2004   incisional mass  . LAPAROSCOPY  2006   LOA  . LIPOSUCTION WITH LIPOFILLING Left 05/13/2014   Procedure: LIPOSUCTION WITH LIPOFILLING FOR SYMMETRY;  Surgeon: Theodoro Kos, DO;  Location: [facility];  Service: Plastics;  Laterality: Left;  liposuction from abdomen, lipofilling to left breast  . lumpectomy  2010   left lump-snbx     SOCIAL HISTORY:  Social History   Socioeconomic History  . Marital status: Married    Spouse name: Not on file  . Number of children: Not on file  . Years of education: Not on file  . Highest education level: Not on file  Occupational History  . Not on file  Social Needs  . Financial resource strain: Not on file  . Food insecurity    Worry: Not on file    Inability: Not on file  . Transportation needs    Medical: Not on file    Non-medical: Not on file  Tobacco Use  . Smoking status: Former Smoker    Quit date: 05/06/1996    Years since quitting: 22.5  . Smokeless tobacco: Never Used  Substance and Sexual Activity  . Alcohol use: Yes    Alcohol/week: 2.0 standard drinks    Types: 2 Glasses of wine per week  Comment: red wine, glass a night  . Drug use: No  . Sexual activity: Yes  Lifestyle  . Physical activity    Days per week: Not on file    Minutes per session: Not on file  . Stress: Not on file  Relationships  . Social Herbalist on phone: Not on file    Gets together: Not on file    Attends religious service: Not on file    Active member of club or organization: Not on file    Attends meetings of clubs or organizations: Not on file    Relationship status: Not on file  . Intimate partner violence    Fear of current or ex partner: Not on file    Emotionally abused: Not on file     Physically abused: Not on file    Forced sexual activity: Not on file  Other Topics Concern  . Not on file  Social History Narrative  . Not on file    FAMILY HISTORY:  Family History  Problem Relation Age of Onset  . Heart attack Father   . Cancer Maternal Aunt        ovarian cancer  . Colon cancer Maternal Aunt     CURRENT MEDICATIONS:  Outpatient Encounter Medications as of 11/07/2018  Medication Sig  . ALPRAZolam (XANAX) 0.25 MG tablet   . calcium-vitamin D (OSCAL WITH D) 500-200 MG-UNIT per tablet Take 1 tablet by mouth daily.    . hydrocortisone (ANUSOL-HC) 25 MG suppository Place 1 suppository (25 mg total) rectally at bedtime. (Patient not taking: Reported on 08/02/2017)  . Linaclotide (LINZESS) 145 MCG CAPS capsule Take 145 mcg by mouth daily.  . Polysacchar Iron-FA-B12 (FERREX 150 FORTE) 150-1-25 MG-MG-MCG CAPS Take 1 capsule by mouth daily. (Patient not taking: Reported on 08/02/2017)  . vitamin B-12 (CYANOCOBALAMIN) 500 MCG tablet Take 500 mcg by mouth daily.   No facility-administered encounter medications on file as of 11/07/2018.     ALLERGIES:  No Known Allergies   PHYSICAL EXAM:  ECOG Performance status: 1  Vitals:   11/07/18 1058  BP: 128/82  Pulse: 91  Resp: 16  Temp: 97.8 F (36.6 C)  SpO2: 100%   Filed Weights   11/07/18 1058  Weight: 157 lb 6.4 oz (71.4 kg)    Physical Exam Constitutional:      Appearance: Normal appearance. She is normal weight.  Cardiovascular:     Rate and Rhythm: Normal rate and regular rhythm.     Heart sounds: Normal heart sounds.  Pulmonary:     Effort: Pulmonary effort is normal.     Breath sounds: Normal breath sounds.  Abdominal:     General: Bowel sounds are normal.     Palpations: Abdomen is soft.  Musculoskeletal: Normal range of motion.  Skin:    General: Skin is warm and dry.  Neurological:     Mental Status: She is alert and oriented to person, place, and time. Mental status is at baseline.   Psychiatric:        Mood and Affect: Mood normal.        Behavior: Behavior normal.        Thought Content: Thought content normal.        Judgment: Judgment normal.   Breast: No palpable masses, no skin changes or nipple discharge, no adenopathy.    LABORATORY DATA:  I have reviewed the labs as listed.  CBC    Component Value Date/Time   WBC  3.7 (L) 10/30/2018 0900   RBC 3.80 (L) 10/30/2018 0900   HGB 10.6 (L) 10/30/2018 0900   HCT 34.7 (L) 10/30/2018 0900   PLT 214 10/30/2018 0900   MCV 91.3 10/30/2018 0900   MCH 27.9 10/30/2018 0900   MCHC 30.5 10/30/2018 0900   RDW 12.6 10/30/2018 0900   LYMPHSABS 1.4 10/30/2018 0900   MONOABS 0.2 10/30/2018 0900   EOSABS 0.1 10/30/2018 0900   BASOSABS 0.0 10/30/2018 0900   CMP Latest Ref Rng & Units 10/30/2018 03/30/2018 08/02/2017  Glucose 70 - 99 mg/dL 101(H) 88 98  BUN 6 - 20 mg/dL 19 23(H) 19  Creatinine 0.44 - 1.00 mg/dL 0.66 0.58 0.69  Sodium 135 - 145 mmol/L 145 140 142  Potassium 3.5 - 5.1 mmol/L 4.2 3.5 3.8  Chloride 98 - 111 mmol/L 106 108 103  CO2 22 - 32 mmol/L 29 25 27   Calcium 8.9 - 10.3 mg/dL 9.5 8.9 9.7  Total Protein 6.5 - 8.1 g/dL 7.0 7.7 8.5(H)  Total Bilirubin 0.3 - 1.2 mg/dL 0.8 0.9 1.3(H)  Alkaline Phos 38 - 126 U/L 57 57 51  AST 15 - 41 U/L 20 24 31   ALT 0 - 44 U/L 14 24 36       DIAGNOSTIC IMAGING:  I have independently reviewed the mammogram and discussed with the patient.  I personally performed a face-to-face visit.  All questions were answered to patient's stated satisfaction. Encouraged patient to call with any new concerns or questions before his next visit to the cancer center and we can certain see him sooner, if needed.     ASSESSMENT & PLAN:   Ductal carcinoma in situ of breast 1. 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 -DCIS was intermediate grade and measured 2.7 cm close to the chest wall, ER  positive, PR negative.  2 mm of negative margins were obtained at the time of the surgery. -She refused anti-estrogen therapy. -She is status post breast reconstruction due to asymmetry by Dr. Loel Lofty Sanger on 05/13/2014. -She was a candidate for genetic counseling but did not show up for her January 2017 appointment with genetic counselor Roma Kayser. - She had a screening mammogram on 11/06/2018 that was BI-RADS Category 0 incomplete.  It showed possible mass within left breast.  Right breast showed no signs of malignancy. - She is scheduled for a diagnostic mammogram on 11/11/2018. - She will follow-up in 2 weeks after her mammogram for results.  2.  Iron deficiency anemia: - She was last treated with IV iron 05/05/2015. - Labs on 10/30/2018 showed her hemoglobin has dropped to 10.7, ferritin is 59. -I recommended 2 infusions of IV iron however she would like to wait till after her diagnostic mammograms to talk about iron. -She did report she has been having blood with her bowel movements due to her being severely constipated. -I placed her on Colace once a day.  Told her to increase it to twice a day if no results.  3.  Liver cyst: - Her last CT of abdomen and pelvis done on 10/05/2014 showed a stable 11 mm hepatic dome cyst. -She follows up with Dr. Melony Overly for this.      Orders placed this encounter:  Orders Placed This Encounter  Procedures  . Lactate dehydrogenase  . CBC with Differential/Platelet  . Comprehensive metabolic panel  . Ferritin  . Iron and TIBC  . Vitamin B12  .  VITAMIN D 25 Hydroxy (Vit-D Deficiency, Fractures)  . Folate      Francene Finders, FNP-C [place] [phone]

## 2018-11-07 NOTE — Patient Instructions (Signed)
[place] at [facility] Discharge Instructions  Follow up in 2 weeks after mammo with labs. Start taking colace daily. May increase to twice a day if needed.   Thank you for choosing [facility] at [facility] to provide your oncology and hematology care.  To afford each patient quality time with our provider, please arrive at least 15 minutes before your scheduled appointment time.   If you have a lab appointment with the [facility] please come in thru the  Main Entrance and check in at the main information desk  You need to re-schedule your appointment should you arrive 10 or more minutes late.  We strive to give you quality time with our providers, and arriving late affects you and other patients whose appointments are after yours.  Also, if you no show three or more times for appointments you may be dismissed from the clinic at the providers discretion.     Again, thank you for choosing [facility].  Our hope is that these requests will decrease the amount of time that you wait before being seen by our physicians.       _____________________________________________________________  Should you have questions after your visit to [facility], please contact our office at [phone] between the hours of 8:00 a.m. and 4:30 p.m.  Voicemails left after 4:00 p.m. will not be returned until the following business day.  For prescription refill requests, have your pharmacy contact our office and allow 72 hours.    Cancer Center Support Programs:   > Cancer Support Group  2nd Tuesday of the month 1pm-2pm, Journey Room

## 2018-11-11 ENCOUNTER — Ambulatory Visit (HOSPITAL_COMMUNITY)
Admission: RE | Admit: 2018-11-11 | Discharge: 2018-11-11 | Disposition: A | Discharge status: Home / Self Care | Payer: BC Managed Care – PPO | Source: Ambulatory Visit | Attending: Nurse Practitioner | Admitting: Nurse Practitioner

## 2018-11-11 ENCOUNTER — Other Ambulatory Visit: Payer: Self-pay

## 2018-11-11 ENCOUNTER — Ambulatory Visit (HOSPITAL_COMMUNITY): Payer: BC Managed Care – PPO

## 2018-11-11 DIAGNOSIS — R928 Other abnormal and inconclusive findings on diagnostic imaging of breast: Secondary | ICD-10-CM | POA: Insufficient documentation

## 2018-11-13 ENCOUNTER — Other Ambulatory Visit (HOSPITAL_COMMUNITY): Payer: BC Managed Care – PPO

## 2018-11-13 ENCOUNTER — Ambulatory Visit (HOSPITAL_COMMUNITY): Payer: BC Managed Care – PPO | Admitting: Nurse Practitioner

## 2018-11-13 NOTE — Assessment & Plan Note (Deleted)
1. 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 -DCIS was intermediate grade and measured 2.7 cm close to the chest wall, ER positive, PR negative.  2 mm of negative margins were obtained at the time of the surgery. -She refused anti-estrogen therapy. -She is status post breast reconstruction due to asymmetry by Dr. Loel Lofty Sanger on 05/13/2014. -She was a candidate for genetic counseling but did not show up for her January 2017 appointment with genetic counselor Roma Kayser. - She had a screening mammogram on 11/06/2018 that was BI-RADS Category 0 incomplete.  It showed possible mass within left breast.  Right breast showed no signs of malignancy. - She is scheduled for a diagnostic mammogram on 11/11/2018. - She will follow-up in 2 weeks after her mammogram for results.  2.  Iron deficiency anemia: - She was last treated with IV iron 05/05/2015. - Labs on 10/30/2018 showed her hemoglobin has dropped to 10.7, ferritin is 59. -I recommended 2 infusions of IV iron however she would like to wait till after her diagnostic mammograms to talk about iron. -She did report she has been having blood with her bowel movements due to her being severely constipated. -I placed her on Colace once a day.  Told her to increase it to twice a day if no results.  3.  Liver cyst: - Her last CT of abdomen and pelvis done on 10/05/2014 showed a stable 11 mm hepatic dome cyst. -She follows up with Dr. Melony Overly for this.

## 2018-11-13 NOTE — Progress Notes (Deleted)
Veronica Dudley, [place] [address]   CLINIC:  Medical Oncology/Hematology  PCP:  Sharilyn Sites, [place] [address] [phone]   REASON FOR VISIT: Follow-up for ***  CURRENT THERAPY: ***    CANCER STAGING: Cancer Staging Ductal carcinoma in situ of breast Staging form: Breast, AJCC 7th Edition - Clinical stage from 04/22/2015: Stage 0 (Tis (DCIS), N0, M0) - Signed by Baird Cancer, PA-C on 04/22/2015    INTERVAL HISTORY:  Veronica Dudley 53 y.o. female returns for routine follow-up     REVIEW OF SYSTEMS:  Review of Systems - Oncology   PAST MEDICAL/SURGICAL HISTORY:  Past Medical History:  Diagnosis Date  . Breast CA ([facility]) 09/07/2011  . Breast cancer ([facility]) 6/10   DCIS  . Cancer ([facility])    lt breast ca 2010/surg-lumpectomy/rad tx  . Endometriosis   . H/O bilateral oophorectomy 03/19/2014  . Iron deficiency anemia   . Iron deficiency anemia 09/07/2011  . PONV (postoperative nausea and vomiting)    Past Surgical History:  Procedure Laterality Date  . ABDOMINAL HYSTERECTOMY  2010  . BILATERAL OOPHORECTOMY Bilateral 2010  . CESAREAN SECTION    . CHOLECYSTECTOMY  2005   lap choli  . COLONOSCOPY N/A 05/12/2015   Procedure: COLONOSCOPY;  Surgeon: Rogene Houston, MD;  [facility];  Service: Endoscopy;  Laterality: N/A;  955 - moved to 1/12 @ 9:30  . DIAGNOSTIC LAPAROSCOPY  2004   incisional mass  . LAPAROSCOPY  2006   LOA  . LIPOSUCTION WITH LIPOFILLING Left 05/13/2014   Procedure: LIPOSUCTION WITH LIPOFILLING FOR SYMMETRY;  Surgeon: Theodoro Kos, DO;  Location: [facility];  Service: Plastics;  Laterality: Left;  liposuction from abdomen, lipofilling to left breast  . lumpectomy  2010   left lump-snbx     SOCIAL HISTORY:  Social History   Socioeconomic History  . Marital status: Married    Spouse name: Not on file  . Number of children: Not on file  . Years of  education: Not on file  . Highest education level: Not on file  Occupational History  . Not on file  Social Needs  . Financial resource strain: Not on file  . Food insecurity    Worry: Not on file    Inability: Not on file  . Transportation needs    Medical: Not on file    Non-medical: Not on file  Tobacco Use  . Smoking status: Former Smoker    Quit date: 05/06/1996    Years since quitting: 22.5  . Smokeless tobacco: Never Used  Substance and Sexual Activity  . Alcohol use: Yes    Alcohol/week: 2.0 standard drinks    Types: 2 Glasses of wine per week    Comment: red wine, glass a night  . Drug use: No  . Sexual activity: Yes  Lifestyle  . Physical activity    Days per week: Not on file    Minutes per session: Not on file  . Stress: Not on file  Relationships  . Social Herbalist on phone: Not on file    Gets together: Not on file    Attends religious service: Not on file    Active member of club or organization: Not on file    Attends meetings of clubs or organizations: Not on file    Relationship status: Not on file  . Intimate partner violence    Fear of  current or ex partner: Not on file    Emotionally abused: Not on file    Physically abused: Not on file    Forced sexual activity: Not on file  Other Topics Concern  . Not on file  Social History Narrative  . Not on file    FAMILY HISTORY:  Family History  Problem Relation Age of Onset  . Heart attack Father   . Cancer Maternal Aunt        ovarian cancer  . Colon cancer Maternal Aunt     CURRENT MEDICATIONS:  Outpatient Encounter Medications as of 11/13/2018  Medication Sig  . ALPRAZolam (XANAX) 0.25 MG tablet   . calcium-vitamin D (OSCAL WITH D) 500-200 MG-UNIT per tablet Take 1 tablet by mouth daily.    . hydrocortisone (ANUSOL-HC) 25 MG suppository Place 1 suppository (25 mg total) rectally at bedtime. (Patient not taking: Reported on 08/02/2017)  . Linaclotide (LINZESS) 145 MCG CAPS capsule  Take 145 mcg by mouth daily.  . Polysacchar Iron-FA-B12 (FERREX 150 FORTE) 150-1-25 MG-MG-MCG CAPS Take 1 capsule by mouth daily. (Patient not taking: Reported on 08/02/2017)  . vitamin B-12 (CYANOCOBALAMIN) 500 MCG tablet Take 500 mcg by mouth daily.   No facility-administered encounter medications on file as of 11/13/2018.     ALLERGIES:  No Known Allergies   PHYSICAL EXAM:  ECOG Performance status: 1  There were no vitals filed for this visit. There were no vitals filed for this visit.  Physical Exam   LABORATORY DATA:  I have reviewed the labs as listed.  CBC    Component Value Date/Time   WBC 3.7 (L) 10/30/2018 0900   RBC 3.80 (L) 10/30/2018 0900   HGB 10.6 (L) 10/30/2018 0900   HCT 34.7 (L) 10/30/2018 0900   PLT 214 10/30/2018 0900   MCV 91.3 10/30/2018 0900   MCH 27.9 10/30/2018 0900   MCHC 30.5 10/30/2018 0900   RDW 12.6 10/30/2018 0900   LYMPHSABS 1.4 10/30/2018 0900   MONOABS 0.2 10/30/2018 0900   EOSABS 0.1 10/30/2018 0900   BASOSABS 0.0 10/30/2018 0900   CMP Latest Ref Rng & Units 10/30/2018 03/30/2018 08/02/2017  Glucose 70 - 99 mg/dL 101(H) 88 98  BUN 6 - 20 mg/dL 19 23(H) 19  Creatinine 0.44 - 1.00 mg/dL 0.66 0.58 0.69  Sodium 135 - 145 mmol/L 145 140 142  Potassium 3.5 - 5.1 mmol/L 4.2 3.5 3.8  Chloride 98 - 111 mmol/L 106 108 103  CO2 22 - 32 mmol/L 29 25 27   Calcium 8.9 - 10.3 mg/dL 9.5 8.9 9.7  Total Protein 6.5 - 8.1 g/dL 7.0 7.7 8.5(H)  Total Bilirubin 0.3 - 1.2 mg/dL 0.8 0.9 1.3(H)  Alkaline Phos 38 - 126 U/L 57 57 51  AST 15 - 41 U/L 20 24 31   ALT 0 - 44 U/L 14 24 36       DIAGNOSTIC IMAGING:  I have independently reviewed the scans and discussed with the patient.   I have reviewed Francene Finders, NP's note and agree with the documentation.  I personally performed a face-to-face visit, made revisions and my assessment and plan is as follows.    ASSESSMENT & PLAN:   Ductal carcinoma in situ of breast 1. Ductal carcinoma in situ (DCIS)  for the left breast: - Status post lumpectomy 10/22/2008 followed by XRT finishing on 01/21/2009. -DCIS was intermediate grade and measured 2.7 cm close to the chest wall, ER positive, PR negative.  2 mm of negative margins were obtained  at the time of the surgery. -She refused anti-estrogen therapy. -She is status post breast reconstruction due to asymmetry by Dr. Loel Lofty Sanger on 05/13/2014. -She was a candidate for genetic counseling but did not show up for her January 2017 appointment with genetic counselor Roma Kayser. - She had a screening mammogram on 11/06/2018 that was BI-RADS Category 0 incomplete.  It showed possible mass within left breast.  Right breast showed no signs of malignancy. - She is scheduled for a diagnostic mammogram on 11/11/2018. - She will follow-up in 2 weeks after her mammogram for results.  2.  Iron deficiency anemia: - She was last treated with IV iron 05/05/2015. - Labs on 10/30/2018 showed her hemoglobin has dropped to 10.7, ferritin is 59. -I recommended 2 infusions of IV iron however she would like to wait till after her diagnostic mammograms to talk about iron. -She did report she has been having blood with her bowel movements due to her being severely constipated. -I placed her on Colace once a day.  Told her to increase it to twice a day if no results.  3.  Liver cyst: - Her last CT of abdomen and pelvis done on 10/05/2014 showed a stable 11 mm hepatic dome cyst. -She follows up with Dr. Melony Overly for this.      Orders placed this encounter:  No orders of the defined types were placed in this encounter.     Francene Finders, FNP-C [place] [phone]

## 2018-11-14 NOTE — Progress Notes (Signed)
Please schedule follow-up with Dr. Raliegh Ip or Lala Lund to review

## 2018-11-24 ENCOUNTER — Encounter (HOSPITAL_COMMUNITY): Payer: Self-pay | Admitting: Nurse Practitioner

## 2018-11-24 ENCOUNTER — Inpatient Hospital Stay (HOSPITAL_COMMUNITY): Payer: BC Managed Care – PPO

## 2018-11-24 ENCOUNTER — Inpatient Hospital Stay (HOSPITAL_BASED_OUTPATIENT_CLINIC_OR_DEPARTMENT_OTHER): Payer: BC Managed Care – PPO | Admitting: Nurse Practitioner

## 2018-11-24 ENCOUNTER — Other Ambulatory Visit: Payer: Self-pay

## 2018-11-24 VITALS — BP 135/86 | HR 85 | Temp 97.9°F | Resp 16 | Wt 160.0 lb

## 2018-11-24 DIAGNOSIS — D500 Iron deficiency anemia secondary to blood loss (chronic): Secondary | ICD-10-CM

## 2018-11-24 DIAGNOSIS — Z853 Personal history of malignant neoplasm of breast: Secondary | ICD-10-CM

## 2018-11-24 DIAGNOSIS — D509 Iron deficiency anemia, unspecified: Secondary | ICD-10-CM

## 2018-11-24 DIAGNOSIS — K5900 Constipation, unspecified: Secondary | ICD-10-CM | POA: Diagnosis not present

## 2018-11-24 DIAGNOSIS — K921 Melena: Secondary | ICD-10-CM

## 2018-11-24 DIAGNOSIS — Z8041 Family history of malignant neoplasm of ovary: Secondary | ICD-10-CM | POA: Diagnosis not present

## 2018-11-24 DIAGNOSIS — K7689 Other specified diseases of liver: Secondary | ICD-10-CM | POA: Diagnosis not present

## 2018-11-24 DIAGNOSIS — Z7289 Other problems related to lifestyle: Secondary | ICD-10-CM | POA: Diagnosis not present

## 2018-11-24 DIAGNOSIS — Z923 Personal history of irradiation: Secondary | ICD-10-CM | POA: Diagnosis not present

## 2018-11-24 DIAGNOSIS — D0512 Intraductal carcinoma in situ of left breast: Secondary | ICD-10-CM

## 2018-11-24 DIAGNOSIS — Z800 Family history of malignant neoplasm of digestive organs: Secondary | ICD-10-CM | POA: Diagnosis not present

## 2018-11-24 DIAGNOSIS — Z87891 Personal history of nicotine dependence: Secondary | ICD-10-CM | POA: Diagnosis not present

## 2018-11-24 LAB — CBC WITH DIFFERENTIAL/PLATELET
Abs Immature Granulocytes: 0.02 10*3/uL (ref 0.00–0.07)
Basophils Absolute: 0 10*3/uL (ref 0.0–0.1)
Basophils Relative: 0 %
Eosinophils Absolute: 0.2 10*3/uL (ref 0.0–0.5)
Eosinophils Relative: 3 %
HCT: 37.7 % (ref 36.0–46.0)
Hemoglobin: 11.4 g/dL — ABNORMAL LOW (ref 12.0–15.0)
Immature Granulocytes: 0 %
Lymphocytes Relative: 21 %
Lymphs Abs: 1.4 10*3/uL (ref 0.7–4.0)
MCH: 27.7 pg (ref 26.0–34.0)
MCHC: 30.2 g/dL (ref 30.0–36.0)
MCV: 91.7 fL (ref 80.0–100.0)
Monocytes Absolute: 0.3 10*3/uL (ref 0.1–1.0)
Monocytes Relative: 4 %
Neutro Abs: 4.9 10*3/uL (ref 1.7–7.7)
Neutrophils Relative %: 72 %
Platelets: 214 10*3/uL (ref 150–400)
RBC: 4.11 MIL/uL (ref 3.87–5.11)
RDW: 13.2 % (ref 11.5–15.5)
WBC: 6.8 10*3/uL (ref 4.0–10.5)
nRBC: 0 % (ref 0.0–0.2)

## 2018-11-24 LAB — COMPREHENSIVE METABOLIC PANEL
ALT: 16 U/L (ref 0–44)
AST: 21 U/L (ref 15–41)
Albumin: 3.9 g/dL (ref 3.5–5.0)
Alkaline Phosphatase: 58 U/L (ref 38–126)
Anion gap: 7 (ref 5–15)
BUN: 17 mg/dL (ref 6–20)
CO2: 28 mmol/L (ref 22–32)
Calcium: 9.1 mg/dL (ref 8.9–10.3)
Chloride: 106 mmol/L (ref 98–111)
Creatinine, Ser: 0.59 mg/dL (ref 0.44–1.00)
GFR calc Af Amer: >60 mL/min (ref >60)
GFR calc non Af Amer: >60 mL/min (ref >60)
Glucose, Bld: 98 mg/dL (ref 70–99)
Potassium: 4.3 mmol/L (ref 3.5–5.1)
Sodium: 141 mmol/L (ref 135–145)
Total Bilirubin: 0.8 mg/dL (ref 0.3–1.2)
Total Protein: 7.7 g/dL (ref 6.5–8.1)

## 2018-11-24 LAB — IRON AND TIBC
Iron: 112 ug/dL (ref 28–170)
Saturation Ratios: 34 % — ABNORMAL HIGH (ref 10.4–31.8)
TIBC: 330 ug/dL (ref 250–450)
UIBC: 218 ug/dL

## 2018-11-24 LAB — VITAMIN B12: Vitamin B-12: 185 pg/mL (ref 180–914)

## 2018-11-24 LAB — LACTATE DEHYDROGENASE: LDH: 121 U/L (ref 98–192)

## 2018-11-24 LAB — FOLATE: Folate: 10.8 ng/mL (ref >5.9)

## 2018-11-24 LAB — FERRITIN: Ferritin: 41 ng/mL (ref 11–307)

## 2018-11-24 NOTE — Patient Instructions (Addendum)
[facility] at [facility] Discharge Instructions  Follow up in 6 months with labs    Thank you for choosing [facility] at [facility] to provide your oncology and hematology care.  To afford each patient quality time with our provider, please arrive at least 15 minutes before your scheduled appointment time.   If you have a lab appointment with the Cancer Center please come in thru the  Main Entrance and check in at the main information desk  You need to re-schedule your appointment should you arrive 10 or more minutes late.  We strive to give you quality time with our providers, and arriving late affects you and other patients whose appointments are after yours.  Also, if you no show three or more times for appointments you may be dismissed from the clinic at the providers discretion.     Again, thank you for choosing [facility].  Our hope is that these requests will decrease the amount of time that you wait before being seen by our physicians.       _____________________________________________________________  Should you have questions after your visit to [facility], please contact our office at [phone] between the hours of 8:00 a.m. and 4:30 p.m.  Voicemails left after 4:00 p.m. will not be returned until the following business day.  For prescription refill requests, have your pharmacy contact our office and allow 72 hours.    Cancer Center Support Programs:   > Cancer Support Group  2nd Tuesday of the month 1pm-2pm, Journey Room    

## 2018-11-24 NOTE — Assessment & Plan Note (Addendum)
1. 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 -DCIS was intermediate grade and measured 2.7 cm close to the chest wall, ER positive, PR negative.  2 mm of negative margins were obtained at the time of the surgery. -She refused anti-estrogen therapy. -She is status post breast reconstruction due to asymmetry by Dr. Loel Lofty Sanger on 05/13/2014. -She was a candidate for genetic counseling but did not show up for her January 2017 appointment with genetic counselor Roma Kayser. - She had a screening mammogram on 11/06/2018 that was BI-RADS Category 0 incomplete.  It showed possible mass within left breast.  Right breast showed no signs of malignancy. -Her last mammogram was on 11/11/2018 which was BI-RADS Category 2 benign.  She will repeat in 1 year. - She will follow-up in 6 months with repeat labs and breast exam.  2.  Iron deficiency anemia: - She was last treated with IV iron 05/05/2015. - Labs on 11/24/2018 showed her hemoglobin has improved to 11.4, platelets 214, WBC 6.8, ferritin 41 and percent saturation 34 LDH 121, potassium 4.3, creatinine 0.59. -She reports her energy is good.  She also reports she is not having blood in her stool.  She is taking Colace twice a day and her constipation has improved and she has not seen any more blood. -She could use some IV iron however she wanted to hold off at this time. - We will recheck her labs in 6 months.  She is to call the office sooner if she starts seeing any more blood in her stool or becomes fatigued.  3.  Liver cyst: - Her last CT of abdomen and pelvis done on 10/05/2014 showed a stable 11 mm hepatic dome cyst. -She follows up with Dr. Melony Overly for this.

## 2018-11-24 NOTE — Progress Notes (Signed)
[place], [place] [address]   CLINIC:  Medical Oncology/Hematology  PCP:  Sharilyn Sites, MD [address] [place] [address] [phone]   REASON FOR VISIT: Follow-up for breast cancer and iron deficiency anemia  CURRENT THERAPY: Observation   CANCER STAGING: Cancer Staging Ductal carcinoma in situ of breast Staging form: Breast, AJCC 7th Edition - Clinical stage from 04/22/2015: Stage 0 (Tis (DCIS), N0, M0) - Signed by Baird Cancer, PA-C on 04/22/2015    INTERVAL HISTORY:  [name] 53 y.o. female returns for routine follow-up breast cancer and iron deficiency anemia.  She reports she has been doing well since her last visit.  She reports she is not constipated anymore since increasing her Colace frequency.  She is also not seeing blood in her stool anymore.  She reports her energy is good. Denies any nausea, vomiting, or diarrhea. Denies any new pains. Had not noticed any recent bleeding such as epistaxis, hematuria or hematochezia. Denies recent chest pain on exertion, shortness of breath on minimal exertion, pre-syncopal episodes, or palpitations. Denies any numbness or tingling in hands or feet. Denies any recent fevers, infections, or recent hospitalizations. Patient reports appetite at 100% and energy level at 75%.  She is eating well maintaining her weight at this time.    REVIEW OF SYSTEMS:  Review of Systems  All other systems reviewed and are negative.    PAST MEDICAL/SURGICAL HISTORY:  Past Medical History:  Diagnosis Date  . Breast CA ([facility]) 09/07/2011  . Breast cancer ([facility]) 6/10   DCIS  . Cancer ([facility])    lt breast ca 2010/surg-lumpectomy/rad tx  . Endometriosis   . H/O bilateral oophorectomy 03/19/2014  . Iron deficiency anemia   . Iron deficiency anemia 09/07/2011  . PONV (postoperative nausea and vomiting)    Past Surgical History:  Procedure Laterality Date  . ABDOMINAL HYSTERECTOMY  2010  .  BILATERAL OOPHORECTOMY Bilateral 2010  . CESAREAN SECTION    . CHOLECYSTECTOMY  2005   lap choli  . COLONOSCOPY N/A 05/12/2015   Procedure: COLONOSCOPY;  Surgeon: Rogene Houston, MD;  [facility];  Service: Endoscopy;  Laterality: N/A;  955 - moved to 1/12 @ 9:30  . DIAGNOSTIC LAPAROSCOPY  2004   incisional mass  . LAPAROSCOPY  2006   LOA  . LIPOSUCTION WITH LIPOFILLING Left 05/13/2014   Procedure: LIPOSUCTION WITH LIPOFILLING FOR SYMMETRY;  Surgeon: Theodoro Kos, DO;  Location: [facility];  Service: Plastics;  Laterality: Left;  liposuction from abdomen, lipofilling to left breast  . lumpectomy  2010   left lump-snbx     SOCIAL HISTORY:  Social History   Socioeconomic History  . Marital status: Married    Spouse name: Not on file  . Number of children: Not on file  . Years of education: Not on file  . Highest education level: Not on file  Occupational History  . Not on file  Social Needs  . Financial resource strain: Not on file  . Food insecurity    Worry: Not on file    Inability: Not on file  . Transportation needs    Medical: Not on file    Non-medical: Not on file  Tobacco Use  . Smoking status: Former Smoker    Quit date: 05/06/1996    Years since quitting: 22.5  . Smokeless tobacco: Never Used  Substance and Sexual Activity  . Alcohol use: Yes    Alcohol/week: 2.0  standard drinks    Types: 2 Glasses of wine per week    Comment: red wine, glass a night  . Drug use: No  . Sexual activity: Yes  Lifestyle  . Physical activity    Days per week: Not on file    Minutes per session: Not on file  . Stress: Not on file  Relationships  . Social Herbalist on phone: Not on file    Gets together: Not on file    Attends religious service: Not on file    Active member of club or organization: Not on file    Attends meetings of clubs or organizations: Not on file    Relationship status: Not on file  . Intimate partner violence     Fear of current or ex partner: Not on file    Emotionally abused: Not on file    Physically abused: Not on file    Forced sexual activity: Not on file  Other Topics Concern  . Not on file  Social History Narrative  . Not on file    FAMILY HISTORY:  Family History  Problem Relation Age of Onset  . Heart attack Father   . Cancer Maternal Aunt        ovarian cancer  . Colon cancer Maternal Aunt     CURRENT MEDICATIONS:  Outpatient Encounter Medications as of 11/24/2018  Medication Sig  . ALPRAZolam (XANAX) 0.25 MG tablet   . hydrocortisone (ANUSOL-HC) 25 MG suppository Place 1 suppository (25 mg total) rectally at bedtime.  . Linaclotide (LINZESS) 145 MCG CAPS capsule Take 145 mcg by mouth daily.  . vitamin B-12 (CYANOCOBALAMIN) 500 MCG tablet Take 500 mcg by mouth daily.  . [DISCONTINUED] calcium-vitamin D (OSCAL WITH D) 500-200 MG-UNIT per tablet Take 1 tablet by mouth daily.    . [DISCONTINUED] Polysacchar Iron-FA-B12 (FERREX 150 FORTE) 150-1-25 MG-MG-MCG CAPS Take 1 capsule by mouth daily.   No facility-administered encounter medications on file as of 11/24/2018.     ALLERGIES:  No Known Allergies   PHYSICAL EXAM:  ECOG Performance status: 1  Vitals:   11/24/18 1100  BP: 135/86  Pulse: 85  Resp: 16  Temp: 97.9 F (36.6 C)  SpO2: 100%   Filed Weights   11/24/18 1100  Weight: 160 lb (72.6 kg)    Physical Exam Constitutional:      Appearance: Normal appearance. She is normal weight.  Cardiovascular:     Rate and Rhythm: Normal rate and regular rhythm.     Heart sounds: Normal heart sounds.  Pulmonary:     Effort: Pulmonary effort is normal.     Breath sounds: Normal breath sounds.  Abdominal:     General: Bowel sounds are normal.     Palpations: Abdomen is soft.  Musculoskeletal: Normal range of motion.  Skin:    General: Skin is warm and dry.  Neurological:     Mental Status: She is alert and oriented to person, place, and time. Mental status is at  baseline.  Psychiatric:        Mood and Affect: Mood normal.        Behavior: Behavior normal.        Thought Content: Thought content normal.        Judgment: Judgment normal.      LABORATORY DATA:  I have reviewed the labs as listed.  CBC    Component Value Date/Time   WBC 6.8 11/24/2018 1118   RBC 4.11 11/24/2018 1118  HGB 11.4 (L) 11/24/2018 1118   HCT 37.7 11/24/2018 1118   PLT 214 11/24/2018 1118   MCV 91.7 11/24/2018 1118   MCH 27.7 11/24/2018 1118   MCHC 30.2 11/24/2018 1118   RDW 13.2 11/24/2018 1118   LYMPHSABS 1.4 11/24/2018 1118   MONOABS 0.3 11/24/2018 1118   EOSABS 0.2 11/24/2018 1118   BASOSABS 0.0 11/24/2018 1118   CMP Latest Ref Rng & Units 11/24/2018 10/30/2018 03/30/2018  Glucose 70 - 99 mg/dL 98 101(H) 88  BUN 6 - 20 mg/dL 17 19 23(H)  Creatinine 0.44 - 1.00 mg/dL 0.59 0.66 0.58  Sodium 135 - 145 mmol/L 141 145 140  Potassium 3.5 - 5.1 mmol/L 4.3 4.2 3.5  Chloride 98 - 111 mmol/L 106 106 108  CO2 22 - 32 mmol/L 28 29 25   Calcium 8.9 - 10.3 mg/dL 9.1 9.5 8.9  Total Protein 6.5 - 8.1 g/dL 7.7 7.0 7.7  Total Bilirubin 0.3 - 1.2 mg/dL 0.8 0.8 0.9  Alkaline Phos 38 - 126 U/L 58 57 57  AST 15 - 41 U/L 21 20 24   ALT 0 - 44 U/L 16 14 24      I personally performed a face-to-face visit.  All questions were answered to patient's stated satisfaction. Encouraged patient to call with any new concerns or questions before his next visit to the cancer center and we can certain see him sooner, if needed.     ASSESSMENT & PLAN:   Ductal carcinoma in situ of breast 1. 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 -DCIS was intermediate grade and measured 2.7 cm close to the chest wall, ER positive, PR negative.  2 mm of negative margins were obtained at the time of the surgery. -She refused anti-estrogen therapy. -She is status post breast reconstruction due to asymmetry by Dr.  Loel Lofty Sanger on 05/13/2014. -She was a candidate for genetic counseling but did not show up for her January 2017 appointment with genetic counselor Roma Kayser. - She had a screening mammogram on 11/06/2018 that was BI-RADS Category 0 incomplete.  It showed possible mass within left breast.  Right breast showed no signs of malignancy. -Her last mammogram was on 11/11/2018 which was BI-RADS Category 2 benign.  She will repeat in 1 year. - She will follow-up in 6 months with repeat labs and breast exam.  2.  Iron deficiency anemia: - She was last treated with IV iron 05/05/2015. - Labs on 11/24/2018 showed her hemoglobin has improved to 11.4, platelets 214, WBC 6.8, ferritin 41 and percent saturation 34 LDH 121, potassium 4.3, creatinine 0.59. -She reports her energy is good.  She also reports she is not having blood in her stool.  She is taking Colace twice a day and her constipation has improved and she has not seen any more blood. -She could use some IV iron however she wanted to hold off at this time. - We will recheck her labs in 6 months.  She is to call the office sooner if she starts seeing any more blood in her stool or becomes fatigued.  3.  Liver cyst: - Her last CT of abdomen and pelvis done on 10/05/2014 showed a stable 11 mm hepatic dome cyst. -She follows up with Dr. Melony Overly for this.      Orders placed this encounter:  Orders Placed This Encounter  Procedures  . Lactate dehydrogenase  . CBC with Differential/Platelet  . Comprehensive metabolic  panel  . Ferritin  . Iron and TIBC  . Vitamin B12  . VITAMIN D 25 Hydroxy (Vit-D Deficiency, Fractures)  . Folate      Francene Finders, FNP-C [place] [phone]

## 2018-11-25 LAB — VITAMIN D 25 HYDROXY (VIT D DEFICIENCY, FRACTURES): Vit D, 25-Hydroxy: 16.6 ng/mL — ABNORMAL LOW (ref 30.0–100.0)

## 2018-11-26 ENCOUNTER — Telehealth (HOSPITAL_COMMUNITY): Payer: Self-pay | Admitting: *Deleted

## 2018-11-26 NOTE — Telephone Encounter (Signed)
Called patient to let her know provider wanted her to starting taking Vitamin D over the counter 2,000 IU daily due to low Vitamin D levels. Provider will re-check  Vit. D levels upon follow-up. Patient verbalized understanding.

## 2018-12-10 DIAGNOSIS — C50912 Malignant neoplasm of unspecified site of left female breast: Secondary | ICD-10-CM | POA: Diagnosis not present

## 2018-12-10 DIAGNOSIS — G500 Trigeminal neuralgia: Secondary | ICD-10-CM | POA: Diagnosis not present

## 2018-12-10 DIAGNOSIS — Z6824 Body mass index (BMI) 24.0-24.9, adult: Secondary | ICD-10-CM | POA: Diagnosis not present

## 2018-12-17 ENCOUNTER — Other Ambulatory Visit (HOSPITAL_COMMUNITY): Payer: Self-pay | Admitting: Internal Medicine

## 2018-12-17 DIAGNOSIS — R519 Headache, unspecified: Secondary | ICD-10-CM

## 2018-12-25 ENCOUNTER — Ambulatory Visit (HOSPITAL_COMMUNITY)
Admission: RE | Admit: 2018-12-25 | Discharge: 2018-12-25 | Disposition: A | Discharge status: Home / Self Care | Payer: BC Managed Care – PPO | Source: Ambulatory Visit | Attending: Internal Medicine | Admitting: Internal Medicine

## 2018-12-25 ENCOUNTER — Other Ambulatory Visit: Payer: Self-pay

## 2018-12-25 DIAGNOSIS — R519 Headache, unspecified: Secondary | ICD-10-CM

## 2018-12-25 DIAGNOSIS — R51 Headache: Secondary | ICD-10-CM | POA: Diagnosis not present

## 2019-02-03 ENCOUNTER — Ambulatory Visit (HOSPITAL_COMMUNITY)
Admission: RE | Admit: 2019-02-03 | Discharge: 2019-02-03 | Disposition: A | Discharge status: Home / Self Care | Payer: BC Managed Care – PPO | Source: Ambulatory Visit | Attending: Internal Medicine | Admitting: Internal Medicine

## 2019-02-03 ENCOUNTER — Other Ambulatory Visit (HOSPITAL_COMMUNITY): Payer: Self-pay | Admitting: Internal Medicine

## 2019-02-03 ENCOUNTER — Other Ambulatory Visit: Payer: Self-pay

## 2019-02-03 DIAGNOSIS — M79671 Pain in right foot: Secondary | ICD-10-CM | POA: Diagnosis not present

## 2019-02-03 DIAGNOSIS — C50912 Malignant neoplasm of unspecified site of left female breast: Secondary | ICD-10-CM | POA: Diagnosis not present

## 2019-02-03 DIAGNOSIS — Z6824 Body mass index (BMI) 24.0-24.9, adult: Secondary | ICD-10-CM | POA: Diagnosis not present

## 2019-02-17 ENCOUNTER — Other Ambulatory Visit: Payer: Self-pay

## 2019-02-17 ENCOUNTER — Encounter: Payer: Self-pay | Admitting: Sports Medicine

## 2019-02-17 ENCOUNTER — Ambulatory Visit (INDEPENDENT_AMBULATORY_CARE_PROVIDER_SITE_OTHER): Payer: BC Managed Care – PPO | Admitting: Sports Medicine

## 2019-02-17 VITALS — BP 124/81 | HR 86 | Resp 16

## 2019-02-17 DIAGNOSIS — M779 Enthesopathy, unspecified: Secondary | ICD-10-CM | POA: Diagnosis not present

## 2019-02-17 DIAGNOSIS — M722 Plantar fascial fibromatosis: Secondary | ICD-10-CM | POA: Diagnosis not present

## 2019-02-17 DIAGNOSIS — M79671 Pain in right foot: Secondary | ICD-10-CM | POA: Diagnosis not present

## 2019-02-17 DIAGNOSIS — M792 Neuralgia and neuritis, unspecified: Secondary | ICD-10-CM

## 2019-02-17 MED ORDER — DICLOFENAC SODIUM 75 MG PO TBEC: 75.0000 mg | DELAYED_RELEASE_TABLET | Freq: Two times a day (BID) | ORAL | 0 refills | Status: DC

## 2019-02-17 NOTE — Progress Notes (Signed)
Subjective: Veronica Dudley is a 53 y.o. female patient who presents to office for evaluation of right foot pain. Patient complains of progressive pain especially over the last 3 months in the right foot reports that gradually over the last month the pain has continued to worsen has been treated by her PCP who had x-rays ordered and also put her on prednisone by mouth that did not do anything for the pain patient has been taking ibuprofen as well reports that she had to stop walking last month because of the pain in her foot that continued reports that there is tingling to her second and third toes as well as pain along her arch reports that she has also tried icy hot changing up shoes but the pain comes back reports that the pain is worse with weightbearing activities and sometimes that there is sharp shooting pain that wakes her up in the middle of the night nothing that she has tried so far has helped.  Denies injury/trip/fall/sprain/any causative factors.   Review of Systems  All other systems reviewed and are negative.   Patient Active Problem List   Diagnosis Date Noted  . Breast asymmetry following reconstructive surgery 05/13/2014  . Malignant neoplasm of left breast ([place]) 03/24/2014  . H/O bilateral oophorectomy 03/19/2014  . Ductal carcinoma in situ of breast 09/07/2011  . Iron deficiency anemia 09/07/2011    Current Outpatient Medications on File Prior to Visit  Medication Sig Dispense Refill  . ALPRAZolam (XANAX) 0.25 MG tablet     . Probiotic Product (PROBIOTIC PO) Take by mouth. Takes 2 gummies in the morning    . VITAMIN D PO Take 2,000 mg by mouth daily.     No current facility-administered medications on file prior to visit.     Allergies  Allergen Reactions  . Other     Cortisone injections - pt stated, "caused my chest to get tight; gave diarrhea really bad"    Objective:  General: Alert and oriented x3 in no acute distress  Dermatology: No open lesions bilateral  lower extremities, no webspace macerations, no ecchymosis bilateral, all nails x 10 are well manicured.  Vascular: Dorsalis Pedis and Posterior Tibial pedal pulses palpable, Capillary Fill Time 3 seconds,(+) pedal hair growth bilateral, no edema bilateral lower extremities, Temperature gradient within normal limits.  Neurology: Gross sensation intact via light touch bilateral, subjective numbness and tingling to third and fourth toes on the right.  Musculoskeletal: Mild tenderness with palpation at right plantar arch along plantar fascial band pain is worse with extension of toes with likely some distal plantar fasciitis noted as well no pain to plantar fascial insertion on right. Strength within normal limits in all groups bilateral.   Gait: Antalgic gait  Xrays  Right foot from 02/03/2019 no acute findings very minimal heel spur  Assessment and Plan: Problem List Items Addressed This Visit    None    Visit Diagnoses    Tendonitis    -  Primary   Relevant Medications   diclofenac (VOLTAREN) 75 MG EC tablet   Plantar fasciitis       Right foot pain       Neuritis           -Complete examination performed -Xrays reviewed -Discussed treatment options for distal plantar fasciitis versus tendinitis and neuritis -Rx diclofenac -Dispensed cam boot for patient to use every day for the next 2 weeks advised patient if pain is not better at next visit will order MRI  since pain has been going on for over 3 months with no improvement to rule out any type of partial tear -May continue with rest ice elevation and topical pain creams and rubs and soaks as needed -Patient to return to office in 2 weeks or sooner if condition worsens.  Landis Martins, DPM

## 2019-03-03 ENCOUNTER — Encounter: Payer: Self-pay | Admitting: Sports Medicine

## 2019-03-03 ENCOUNTER — Ambulatory Visit (INDEPENDENT_AMBULATORY_CARE_PROVIDER_SITE_OTHER): Payer: BC Managed Care – PPO | Admitting: Sports Medicine

## 2019-03-03 ENCOUNTER — Other Ambulatory Visit: Payer: Self-pay

## 2019-03-03 DIAGNOSIS — M722 Plantar fascial fibromatosis: Secondary | ICD-10-CM | POA: Diagnosis not present

## 2019-03-03 DIAGNOSIS — M79671 Pain in right foot: Secondary | ICD-10-CM

## 2019-03-03 DIAGNOSIS — T148XXA Other injury of unspecified body region, initial encounter: Secondary | ICD-10-CM | POA: Diagnosis not present

## 2019-03-03 DIAGNOSIS — M792 Neuralgia and neuritis, unspecified: Secondary | ICD-10-CM | POA: Diagnosis not present

## 2019-03-03 NOTE — Progress Notes (Signed)
Subjective: Veronica Dudley is a 53 y.o. female patient who returns to office for follow-up evaluation of right foot pain.  Patient reports that the pain is still pretty intense on her right foot reports that the cam boot helps it helps her to be able to ambulate and walk and stand for about 2 hours constantly before she starts to have a lot of pain.  Patient reports that there is also pain that radiates up from her foot to her thigh that sharp shooting in nature that seems to be getting slightly worse or more noticeable since wearing the cam boot patient does admit to a history of some low back discomfort but never has had any further work-up by an orthopedic doctor or any other imaging of her back.  Patient reports that she tried taking the diclofenac and at first it was making her feel hazy or foggy the next day so she started to space it out to see if she would get some additional relief and did not notice any additional help with taking this anti-inflammatory.  No other pedal complaints noted.  Patient Active Problem List   Diagnosis Date Noted  . Breast asymmetry following reconstructive surgery 05/13/2014  . Malignant neoplasm of left breast ([place]) 03/24/2014  . H/O bilateral oophorectomy 03/19/2014  . Ductal carcinoma in situ of breast 09/07/2011  . Iron deficiency anemia 09/07/2011    Current Outpatient Medications on File Prior to Visit  Medication Sig Dispense Refill  . ALPRAZolam (XANAX) 0.25 MG tablet     . diclofenac (VOLTAREN) 75 MG EC tablet Take 1 tablet (75 mg total) by mouth 2 (two) times daily. 30 tablet 0  . Probiotic Product (PROBIOTIC PO) Take by mouth. Takes 2 gummies in the morning    . VITAMIN D PO Take 2,000 mg by mouth daily.     No current facility-administered medications on file prior to visit.     Allergies  Allergen Reactions  . Other     Cortisone injections - pt stated, "caused my chest to get tight; gave diarrhea really bad"    Objective:  General:  Alert and oriented x3 in no acute distress  Dermatology: No open lesions bilateral lower extremities, no webspace macerations, no ecchymosis bilateral, all nails x 10 are well manicured.  Vascular: Dorsalis Pedis and Posterior Tibial pedal pulses palpable, Capillary Fill Time 3 seconds,(+) pedal hair growth bilateral, no edema bilateral lower extremities, Temperature gradient within normal limits.  Neurology: Gross sensation intact via light touch bilateral, subjective numbness and tingling to third and fourth toes on the right.  Subjective burning pain at the level of the right thigh.  Musculoskeletal: Continued mild tenderness with palpation at right plantar arch along plantar fascial band pain is worse with extension of toes with likely some distal plantar fasciitis noted as well no pain to plantar fascial insertion on right. Strength within normal limits in all groups bilateral.   Gait: Antalgic gait Cam boot assisted  Assessment and Plan: Problem List Items Addressed This Visit    None    Visit Diagnoses    Tendon tear    -  Primary   Plantar fasciitis       Right foot pain       Neuritis          -Complete examination performed -Previous xrays reviewed -Re-Discussed treatment options for distal plantar fasciitis versus tendon tear and neuritis -Rx MRI to rule out tear or any other deeper involvement since pain has  not improved with aggressive conservative care -Continue with cam boot until after MRI -May continue with rest ice elevation and topical pain creams and rubs and soaks as needed like before -Patient to return to office after MRI or sooner if condition worsens.  Landis Martins, DPM

## 2019-03-04 ENCOUNTER — Telehealth: Payer: Self-pay | Admitting: *Deleted

## 2019-03-04 DIAGNOSIS — M79671 Pain in right foot: Secondary | ICD-10-CM

## 2019-03-04 NOTE — Telephone Encounter (Signed)
-----   Message from [place], [place] sent at 03/03/2019  6:58 PM EST ----- Regarding: MRI right foot Chronic plantar foot pain not relieved with conservative care MRI rule out partial tear plantar fascia

## 2019-03-04 NOTE — Telephone Encounter (Signed)
Orders to J. Quintana, RN for pre-cert and faxed to [facility]. 

## 2019-03-13 NOTE — Telephone Encounter (Signed)
[facility] - Andee Poles called states not prior authorization is needed for [phone], [id number]. Faxed PA information to [place] with orders.

## 2019-03-13 NOTE — Telephone Encounter (Signed)
[place] states [facility] is not in-network and will need to be authorized by [facility] and she transferred to [phone], left message to call to discuss pre-cert

## 2019-03-28 ENCOUNTER — Other Ambulatory Visit: Payer: Self-pay

## 2019-03-28 ENCOUNTER — Ambulatory Visit
Admission: RE | Admit: 2019-03-28 | Discharge: 2019-03-28 | Disposition: A | Discharge status: Home / Self Care | Payer: BC Managed Care – PPO | Source: Ambulatory Visit | Attending: Sports Medicine | Admitting: Sports Medicine

## 2019-03-28 DIAGNOSIS — M79671 Pain in right foot: Secondary | ICD-10-CM

## 2019-03-30 ENCOUNTER — Telehealth: Payer: Self-pay

## 2019-03-30 NOTE — Telephone Encounter (Signed)
Called and spoke with Pt about her MRI results, Pt stated understanding. Also, I went over with Pt about the Dr.'s instructions if she's still experiencing pain. Pt stated understanding.

## 2019-03-30 NOTE — Telephone Encounter (Signed)
-----   Message from [place], [place] sent at 03/29/2019  8:46 PM EST ----- Please let patient know that her MRI was normal. There is nothing that was seen that could explain her pain based on MRI. If patient is still having pain she should make a follow up appt in office for [place] to further discuss other treatments for [place] to try Thanks Dr. Cannon Kettle

## 2019-03-30 NOTE — Telephone Encounter (Signed)
-----   Message from [place], [place] sent at 03/29/2019  8:46 PM EST ----- Please let patient know that her MRI was normal. There is nothing that was seen that could explain her pain based on MRI. If patient is still having pain she should make a follow up appt in office for [place] to further discuss other treatments for [place] to try Thanks Dr. Cannon Kettle

## 2019-03-31 ENCOUNTER — Other Ambulatory Visit: Payer: Self-pay

## 2019-03-31 DIAGNOSIS — Z20822 Contact with and (suspected) exposure to covid-19: Secondary | ICD-10-CM

## 2019-04-02 ENCOUNTER — Ambulatory Visit (HOSPITAL_COMMUNITY): Payer: BC Managed Care – PPO | Admitting: Nurse Practitioner

## 2019-04-02 ENCOUNTER — Other Ambulatory Visit (HOSPITAL_COMMUNITY): Payer: BC Managed Care – PPO

## 2019-04-02 DIAGNOSIS — Z01419 Encounter for gynecological examination (general) (routine) without abnormal findings: Secondary | ICD-10-CM | POA: Diagnosis not present

## 2019-04-02 DIAGNOSIS — Z6824 Body mass index (BMI) 24.0-24.9, adult: Secondary | ICD-10-CM | POA: Diagnosis not present

## 2019-04-02 DIAGNOSIS — Z124 Encounter for screening for malignant neoplasm of cervix: Secondary | ICD-10-CM | POA: Diagnosis not present

## 2019-04-02 LAB — NOVEL CORONAVIRUS, NAA: SARS-CoV-2, NAA: NOT DETECTED

## 2019-04-28 ENCOUNTER — Other Ambulatory Visit: Payer: Self-pay

## 2019-04-28 ENCOUNTER — Ambulatory Visit: Payer: BC Managed Care – PPO | Attending: Internal Medicine

## 2019-04-28 DIAGNOSIS — Z20822 Contact with and (suspected) exposure to covid-19: Secondary | ICD-10-CM

## 2019-04-28 DIAGNOSIS — Z20828 Contact with and (suspected) exposure to other viral communicable diseases: Secondary | ICD-10-CM | POA: Diagnosis not present

## 2019-04-29 LAB — NOVEL CORONAVIRUS, NAA: SARS-CoV-2, NAA: NOT DETECTED

## 2019-05-07 ENCOUNTER — Ambulatory Visit: Payer: BC Managed Care – PPO | Attending: Internal Medicine

## 2019-05-07 ENCOUNTER — Other Ambulatory Visit: Payer: Self-pay

## 2019-05-07 DIAGNOSIS — Z20822 Contact with and (suspected) exposure to covid-19: Secondary | ICD-10-CM | POA: Diagnosis not present

## 2019-05-08 LAB — NOVEL CORONAVIRUS, NAA: SARS-CoV-2, NAA: NOT DETECTED

## 2019-05-12 ENCOUNTER — Ambulatory Visit: Payer: BC Managed Care – PPO | Admitting: Sports Medicine

## 2019-05-18 ENCOUNTER — Inpatient Hospital Stay (HOSPITAL_COMMUNITY): Payer: BC Managed Care – PPO | Attending: Hematology

## 2019-05-18 ENCOUNTER — Other Ambulatory Visit: Payer: Self-pay

## 2019-05-18 DIAGNOSIS — K5900 Constipation, unspecified: Secondary | ICD-10-CM | POA: Insufficient documentation

## 2019-05-18 DIAGNOSIS — Z923 Personal history of irradiation: Secondary | ICD-10-CM | POA: Insufficient documentation

## 2019-05-18 DIAGNOSIS — Z8041 Family history of malignant neoplasm of ovary: Secondary | ICD-10-CM | POA: Insufficient documentation

## 2019-05-18 DIAGNOSIS — Z800 Family history of malignant neoplasm of digestive organs: Secondary | ICD-10-CM | POA: Insufficient documentation

## 2019-05-18 DIAGNOSIS — K7689 Other specified diseases of liver: Secondary | ICD-10-CM | POA: Insufficient documentation

## 2019-05-18 DIAGNOSIS — Z90710 Acquired absence of both cervix and uterus: Secondary | ICD-10-CM | POA: Diagnosis not present

## 2019-05-18 DIAGNOSIS — Z87891 Personal history of nicotine dependence: Secondary | ICD-10-CM | POA: Insufficient documentation

## 2019-05-18 DIAGNOSIS — D0512 Intraductal carcinoma in situ of left breast: Secondary | ICD-10-CM

## 2019-05-18 DIAGNOSIS — Z90722 Acquired absence of ovaries, bilateral: Secondary | ICD-10-CM | POA: Diagnosis not present

## 2019-05-18 DIAGNOSIS — D509 Iron deficiency anemia, unspecified: Secondary | ICD-10-CM | POA: Insufficient documentation

## 2019-05-18 DIAGNOSIS — D500 Iron deficiency anemia secondary to blood loss (chronic): Secondary | ICD-10-CM

## 2019-05-18 DIAGNOSIS — Z853 Personal history of malignant neoplasm of breast: Secondary | ICD-10-CM | POA: Diagnosis not present

## 2019-05-18 LAB — CBC WITH DIFFERENTIAL/PLATELET
Abs Immature Granulocytes: 0.01 10*3/uL (ref 0.00–0.07)
Basophils Absolute: 0 10*3/uL (ref 0.0–0.1)
Basophils Relative: 1 %
Eosinophils Absolute: 0.1 10*3/uL (ref 0.0–0.5)
Eosinophils Relative: 2 %
HCT: 36.7 % (ref 36.0–46.0)
Hemoglobin: 11.4 g/dL — ABNORMAL LOW (ref 12.0–15.0)
Immature Granulocytes: 0 %
Lymphocytes Relative: 37 %
Lymphs Abs: 1.5 10*3/uL (ref 0.7–4.0)
MCH: 28.4 pg (ref 26.0–34.0)
MCHC: 31.1 g/dL (ref 30.0–36.0)
MCV: 91.5 fL (ref 80.0–100.0)
Monocytes Absolute: 0.2 10*3/uL (ref 0.1–1.0)
Monocytes Relative: 5 %
Neutro Abs: 2.3 10*3/uL (ref 1.7–7.7)
Neutrophils Relative %: 55 %
Platelets: 232 10*3/uL (ref 150–400)
RBC: 4.01 MIL/uL (ref 3.87–5.11)
RDW: 12.6 % (ref 11.5–15.5)
WBC: 4.1 10*3/uL (ref 4.0–10.5)
nRBC: 0 % (ref 0.0–0.2)

## 2019-05-18 LAB — COMPREHENSIVE METABOLIC PANEL
ALT: 21 U/L (ref 0–44)
AST: 28 U/L (ref 15–41)
Albumin: 4.2 g/dL (ref 3.5–5.0)
Alkaline Phosphatase: 57 U/L (ref 38–126)
Anion gap: 6 (ref 5–15)
BUN: 26 mg/dL — ABNORMAL HIGH (ref 6–20)
CO2: 27 mmol/L (ref 22–32)
Calcium: 9.4 mg/dL (ref 8.9–10.3)
Chloride: 109 mmol/L (ref 98–111)
Creatinine, Ser: 0.74 mg/dL (ref 0.44–1.00)
GFR calc Af Amer: >60 mL/min (ref >60)
GFR calc non Af Amer: >60 mL/min (ref >60)
Glucose, Bld: 107 mg/dL — ABNORMAL HIGH (ref 70–99)
Potassium: 4.8 mmol/L (ref 3.5–5.1)
Sodium: 142 mmol/L (ref 135–145)
Total Bilirubin: 0.6 mg/dL (ref 0.3–1.2)
Total Protein: 7.6 g/dL (ref 6.5–8.1)

## 2019-05-18 LAB — IRON AND TIBC
Iron: 96 ug/dL (ref 28–170)
Saturation Ratios: 29 % (ref 10.4–31.8)
TIBC: 333 ug/dL (ref 250–450)
UIBC: 237 ug/dL

## 2019-05-18 LAB — VITAMIN B12: Vitamin B-12: 1299 pg/mL — ABNORMAL HIGH (ref 180–914)

## 2019-05-18 LAB — LACTATE DEHYDROGENASE: LDH: 119 U/L (ref 98–192)

## 2019-05-18 LAB — VITAMIN D 25 HYDROXY (VIT D DEFICIENCY, FRACTURES): Vit D, 25-Hydroxy: 53.13 ng/mL (ref 30–100)

## 2019-05-18 LAB — FOLATE: Folate: 19.5 ng/mL (ref >5.9)

## 2019-05-18 LAB — FERRITIN: Ferritin: 87 ng/mL (ref 11–307)

## 2019-05-25 ENCOUNTER — Inpatient Hospital Stay (HOSPITAL_BASED_OUTPATIENT_CLINIC_OR_DEPARTMENT_OTHER): Payer: BC Managed Care – PPO | Admitting: Nurse Practitioner

## 2019-05-25 ENCOUNTER — Other Ambulatory Visit: Payer: Self-pay

## 2019-05-25 VITALS — BP 131/80 | HR 87 | Temp 98.3°F | Resp 18 | Wt 157.2 lb

## 2019-05-25 DIAGNOSIS — D0512 Intraductal carcinoma in situ of left breast: Secondary | ICD-10-CM

## 2019-05-25 DIAGNOSIS — Z853 Personal history of malignant neoplasm of breast: Secondary | ICD-10-CM | POA: Diagnosis not present

## 2019-05-25 DIAGNOSIS — Z8041 Family history of malignant neoplasm of ovary: Secondary | ICD-10-CM | POA: Diagnosis not present

## 2019-05-25 DIAGNOSIS — D509 Iron deficiency anemia, unspecified: Secondary | ICD-10-CM | POA: Diagnosis not present

## 2019-05-25 DIAGNOSIS — Z923 Personal history of irradiation: Secondary | ICD-10-CM | POA: Diagnosis not present

## 2019-05-25 DIAGNOSIS — Z90722 Acquired absence of ovaries, bilateral: Secondary | ICD-10-CM | POA: Diagnosis not present

## 2019-05-25 DIAGNOSIS — K7689 Other specified diseases of liver: Secondary | ICD-10-CM | POA: Diagnosis not present

## 2019-05-25 DIAGNOSIS — Z90710 Acquired absence of both cervix and uterus: Secondary | ICD-10-CM | POA: Diagnosis not present

## 2019-05-25 DIAGNOSIS — Z800 Family history of malignant neoplasm of digestive organs: Secondary | ICD-10-CM | POA: Diagnosis not present

## 2019-05-25 DIAGNOSIS — K5900 Constipation, unspecified: Secondary | ICD-10-CM | POA: Diagnosis not present

## 2019-05-25 DIAGNOSIS — Z87891 Personal history of nicotine dependence: Secondary | ICD-10-CM | POA: Diagnosis not present

## 2019-05-25 NOTE — Patient Instructions (Signed)
[facility] at [facility] Discharge Instructions  Follow-up in 6 months with repeat labs and mammogram   Thank you for choosing [facility] at [facility] to provide your oncology and hematology care.  To afford each patient quality time with our provider, please arrive at least 15 minutes before your scheduled appointment time.   If you have a lab appointment with the Cancer Center please come in thru the Main Entrance and check in at the main information desk.  You need to re-schedule your appointment should you arrive 10 or more minutes late.  We strive to give you quality time with our providers, and arriving late affects you and other patients whose appointments are after yours.  Also, if you no show three or more times for appointments you may be dismissed from the clinic at the providers discretion.     Again, thank you for choosing [facility].  Our hope is that these requests will decrease the amount of time that you wait before being seen by our physicians.       _____________________________________________________________  Should you have questions after your visit to  Cancer Center, please contact our office at [phone] between the hours of 8:00 a.m. and 4:30 p.m.  Voicemails left after 4:00 p.m. will not be returned until the following business day.  For prescription refill requests, have your pharmacy contact our office and allow 72 hours.    Due to Covid, you will need to wear a mask upon entering the hospital. If you do not have a mask, a mask will be given to you at the Main Entrance upon arrival. For doctor visits, patients may have 1 support person with them. For treatment visits, patients can not have anyone with them due to social distancing guidelines and our immunocompromised population.      

## 2019-05-25 NOTE — Assessment & Plan Note (Signed)
1. 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 -DCIS was intermediate grade and measured 2.7 cm close to the chest wall, ER positive, PR negative.  2 mm of negative margins were obtained at the time of the surgery. -She refused anti-estrogen therapy. -She is status post breast reconstruction due to asymmetry by Dr. Loel Lofty Sanger on 05/13/2014. -She was a candidate for genetic counseling but did not show up for her January 2017 appointment with genetic counselor Roma Kayser. - She had a screening mammogram on 11/06/2018 that was BI-RADS Category 0 incomplete.  It showed possible mass within left breast.  Right breast showed no signs of malignancy. -Her last mammogram was on 11/11/2018 which was BI-RADS Category 2 benign.  She will repeat in 1 year. -Labs done on 05/18/2019 were all WNL - She will follow-up in 6 months with repeat labs and mammogram.  2.  Iron deficiency anemia: - She was last treated with IV iron 05/05/2015. - Labs on 05/18/2019 showed her hemoglobin 11.4, platelets 232, WBC 4.1, ferritin 87 and percent saturation 29, LDH 19, potassium 4.8, creatinine 0.74. -She reports her energy is good.  She denies any bright red bleeding per rectum or melena..  She is taking Colace twice a day and her constipation has improved and she has not seen any more blood. - We will recheck her labs in 6 months.  She is to call the office sooner if she starts seeing any more blood in her stool or becomes fatigued.  3.  Liver cyst: - Her last CT of abdomen and pelvis done on 10/05/2014 showed a stable 11 mm hepatic dome cyst. -She follows up with Dr. Melony Overly for this.

## 2019-05-25 NOTE — Progress Notes (Signed)
[address], [place] [address]   CLINIC:  Medical Oncology/Hematology  PCP:  Sharilyn Sites, [place] [id number] [phone]   REASON FOR VISIT: Follow-up for breast cancer  CURRENT THERAPY: Observation   CANCER STAGING: Cancer Staging Ductal carcinoma in situ of breast Staging form: Breast, AJCC 7th Edition - Clinical stage from 04/22/2015: Stage 0 (Tis (DCIS), N0, M0) - Signed by Baird [facility], PA-C on 04/22/2015    INTERVAL HISTORY:  Ms. Blankley 54 y.o. female returns for routine follow-up for breast cancer.  Patient reports she has been doing well since her last visit.  She reports her energy levels have remained constant.  She denies any bright red bleeding per rectum or melena. Denies any nausea, vomiting, or diarrhea. Denies any new pains. Had not noticed any recent bleeding such as epistaxis, hematuria or hematochezia. Denies recent chest pain on exertion, shortness of breath on minimal exertion, pre-syncopal episodes, or palpitations. Denies any numbness or tingling in hands or feet. Denies any recent fevers, infections, or recent hospitalizations. Patient reports appetite at 75% and energy level at 75%.  She is eating well maintain her weight at this time.     REVIEW OF SYSTEMS:  Review of Systems  Gastrointestinal: Positive for constipation.  Psychiatric/Behavioral: Positive for sleep disturbance.  All other systems reviewed and are negative.    PAST MEDICAL/SURGICAL HISTORY:  Past Medical History:  Diagnosis Date  . Breast CA ([place]) 09/07/2011  . Breast cancer ([place]) 6/10   DCIS  . Cancer ([place])    lt breast ca 2010/surg-lumpectomy/rad tx  . Endometriosis   . H/O bilateral oophorectomy 03/19/2014  . Iron deficiency anemia   . Iron deficiency anemia 09/07/2011  . PONV (postoperative nausea and vomiting)    Past Surgical History:  Procedure Laterality Date  . ABDOMINAL HYSTERECTOMY  2010  .  BILATERAL OOPHORECTOMY Bilateral 2010  . CESAREAN SECTION    . CHOLECYSTECTOMY  2005   lap choli  . COLONOSCOPY N/A 05/12/2015   Procedure: COLONOSCOPY;  Surgeon: Rogene Houston, MD;  [facility];  Service: Endoscopy;  Laterality: N/A;  955 - moved to 1/12 @ 9:30  . DIAGNOSTIC LAPAROSCOPY  2004   incisional mass  . LAPAROSCOPY  2006   LOA  . LIPOSUCTION WITH LIPOFILLING Left 05/13/2014   Procedure: LIPOSUCTION WITH LIPOFILLING FOR SYMMETRY;  Surgeon: Theodoro Kos, DO;  Location: [facility];  Service: Plastics;  Laterality: Left;  liposuction from abdomen, lipofilling to left breast  . lumpectomy  2010   left lump-snbx     SOCIAL HISTORY:  Social History   Socioeconomic History  . Marital status: Married    Spouse name: Not on file  . Number of children: Not on file  . Years of education: Not on file  . Highest education level: Not on file  Occupational History  . Not on file  Tobacco Use  . Smoking status: Former Smoker    Quit date: 05/06/1996    Years since quitting: 23.0  . Smokeless tobacco: Never Used  Substance and Sexual Activity  . Alcohol use: Yes    Alcohol/week: 2.0 standard drinks    Types: 2 Glasses of wine per week    Comment: red wine, glass a night  . Drug use: No  . Sexual activity: Yes  Other Topics Concern  . Not on file  Social History Narrative  . Not on file   Social Determinants  of Health   Financial Resource Strain:   . Difficulty of Paying Living Expenses: Not on file  Food Insecurity:   . Worried About Charity fundraiser in the Last Year: Not on file  . Ran Out of Food in the Last Year: Not on file  Transportation Needs:   . Lack of Transportation (Medical): Not on file  . Lack of Transportation (Non-Medical): Not on file  Physical Activity:   . Days of Exercise per Week: Not on file  . Minutes of Exercise per Session: Not on file  Stress:   . Feeling of Stress : Not on file  Social Connections:   .  Frequency of Communication with Friends and Family: Not on file  . Frequency of Social Gatherings with Friends and Family: Not on file  . Attends Religious Services: Not on file  . Active Member of Clubs or Organizations: Not on file  . Attends Archivist Meetings: Not on file  . Marital Status: Not on file  Intimate Partner Violence:   . Fear of Current or Ex-Partner: Not on file  . Emotionally Abused: Not on file  . Physically Abused: Not on file  . Sexually Abused: Not on file    FAMILY HISTORY:  Family History  Problem Relation Age of Onset  . Heart attack Father   . Cancer Maternal Aunt        ovarian cancer  . Colon cancer Maternal Aunt     CURRENT MEDICATIONS:  Outpatient Encounter Medications as of 05/25/2019  Medication Sig  . Ascorbic Acid (VITAMIN C) 1000 MG tablet Take 1,000 mg by mouth daily.  . cholecalciferol (VITAMIN D) 25 MCG (1000 UNIT) tablet Take 1,000 Units by mouth daily.   . Pediatric Multivit-Minerals-C (ONE-A-DAY SCOOBY-DOO GUMMIES PO) Take 1,000 mg by mouth 2 (two) times daily.  Marland Kitchen ALPRAZolam (XANAX) 0.25 MG tablet Take 0.25 mg by mouth as needed.   . [DISCONTINUED] calcium-vitamin D (OSCAL WITH D) 500-200 MG-UNIT TABS tablet Take by mouth.  . [DISCONTINUED] diclofenac (VOLTAREN) 75 MG EC tablet Take 1 tablet (75 mg total) by mouth 2 (two) times daily.  . [DISCONTINUED] Probiotic Product (PROBIOTIC PO) Take by mouth. Takes 2 gummies in the morning  . [DISCONTINUED] VITAMIN D PO Take 2,000 mg by mouth daily.   No facility-administered encounter medications on file as of 05/25/2019.    ALLERGIES:  Allergies  Allergen Reactions  . Other     Cortisone injections - pt stated, "caused my chest to get tight; gave diarrhea really bad"     PHYSICAL EXAM:  ECOG Performance status: 1  Vitals:   05/25/19 1131  BP: 131/80  Pulse: 87  Resp: 18  Temp: 98.3 F (36.8 C)  SpO2: 100%   Filed Weights   05/25/19 1131  Weight: 157 lb 3.2 oz (71.3  kg)    Physical Exam Constitutional:      Appearance: Normal appearance. She is normal weight.  Cardiovascular:     Rate and Rhythm: Normal rate and regular rhythm.     Heart sounds: Normal heart sounds.  Pulmonary:     Effort: Pulmonary effort is normal.     Breath sounds: Normal breath sounds.  Abdominal:     General: Bowel sounds are normal.     Palpations: Abdomen is soft.  Musculoskeletal:        General: Normal range of motion.  Skin:    General: Skin is warm.  Neurological:     Mental Status: She  is alert and oriented to person, place, and time. Mental status is at baseline.  Psychiatric:        Mood and Affect: Mood normal.        Behavior: Behavior normal.        Thought Content: Thought content normal.        Judgment: Judgment normal.      LABORATORY DATA:  I have reviewed the labs as listed.  CBC    Component Value Date/Time   WBC 4.1 05/18/2019 1145   RBC 4.01 05/18/2019 1145   HGB 11.4 (L) 05/18/2019 1145   HCT 36.7 05/18/2019 1145   PLT 232 05/18/2019 1145   MCV 91.5 05/18/2019 1145   MCH 28.4 05/18/2019 1145   MCHC 31.1 05/18/2019 1145   RDW 12.6 05/18/2019 1145   LYMPHSABS 1.5 05/18/2019 1145   MONOABS 0.2 05/18/2019 1145   EOSABS 0.1 05/18/2019 1145   BASOSABS 0.0 05/18/2019 1145   CMP Latest Ref Rng & Units 05/18/2019 11/24/2018 10/30/2018  Glucose 70 - 99 mg/dL 107(H) 98 101(H)  BUN 6 - 20 mg/dL 26(H) 17 19  Creatinine 0.44 - 1.00 mg/dL 0.74 0.59 0.66  Sodium 135 - 145 mmol/L 142 141 145  Potassium 3.5 - 5.1 mmol/L 4.8 4.3 4.2  Chloride 98 - 111 mmol/L 109 106 106  CO2 22 - 32 mmol/L 27 28 29   Calcium 8.9 - 10.3 mg/dL 9.4 9.1 9.5  Total Protein 6.5 - 8.1 g/dL 7.6 7.7 7.0  Total Bilirubin 0.3 - 1.2 mg/dL 0.6 0.8 0.8  Alkaline Phos 38 - 126 U/L 57 58 57  AST 15 - 41 U/L 28 21 20   ALT 0 - 44 U/L 21 16 14     I personally performed a face-to-face visit.  All questions were answered to patient's stated satisfaction. Encouraged patient to  call with any new concerns or questions before his next visit to the cancer center and we can certain see him sooner, if needed.     ASSESSMENT & PLAN:   Ductal carcinoma in situ of breast 1. 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 -DCIS was intermediate grade and measured 2.7 cm close to the chest wall, ER positive, PR negative.  2 mm of negative margins were obtained at the time of the surgery. -She refused anti-estrogen therapy. -She is status post breast reconstruction due to asymmetry by Dr. Loel Lofty Sanger on 05/13/2014. -She was a candidate for genetic counseling but did not show up for her January 2017 appointment with genetic counselor Roma Kayser. - She had a screening mammogram on 11/06/2018 that was BI-RADS Category 0 incomplete.  It showed possible mass within left breast.  Right breast showed no signs of malignancy. -Her last mammogram was on 11/11/2018 which was BI-RADS Category 2 benign.  She will repeat in 1 year. -Labs done on 05/18/2019 were all WNL - She will follow-up in 6 months with repeat labs and mammogram.  2.  Iron deficiency anemia: - She was last treated with IV iron 05/05/2015. - Labs on 05/18/2019 showed her hemoglobin 11.4, platelets 232, WBC 4.1, ferritin 87 and percent saturation 29, LDH 19, potassium 4.8, creatinine 0.74. -She reports her energy is good.  She denies any bright red bleeding per rectum or melena..  She is taking Colace twice a day and her constipation has improved and she has not seen any more blood. - We will recheck her labs in 6  months.  She is to call the office sooner if she starts seeing any more blood in her stool or becomes fatigued.  3.  Liver cyst: - Her last CT of abdomen and pelvis done on 10/05/2014 showed a stable 11 mm hepatic dome cyst. -She follows up with Dr. Melony Overly for this.      Orders placed this encounter:  Orders Placed This Encounter   Procedures  . MM DIAG BREAST TOMO BILATERAL  . Lactate dehydrogenase  . CBC with Differential/Platelet  . Comprehensive metabolic panel  . Ferritin  . Iron and TIBC  . Vitamin B12  . VITAMIN D 25 Hydroxy (Vit-D Deficiency, Fractures)  . Folate      Francene Finders, FNP-C Center [phone]

## 2019-05-26 ENCOUNTER — Ambulatory Visit (INDEPENDENT_AMBULATORY_CARE_PROVIDER_SITE_OTHER): Payer: BC Managed Care – PPO | Admitting: Sports Medicine

## 2019-05-26 ENCOUNTER — Encounter: Payer: Self-pay | Admitting: Sports Medicine

## 2019-05-26 ENCOUNTER — Other Ambulatory Visit: Payer: Self-pay

## 2019-05-26 ENCOUNTER — Telehealth: Payer: Self-pay | Admitting: *Deleted

## 2019-05-26 VITALS — Temp 96.8°F

## 2019-05-26 DIAGNOSIS — M79671 Pain in right foot: Secondary | ICD-10-CM | POA: Diagnosis not present

## 2019-05-26 DIAGNOSIS — M722 Plantar fascial fibromatosis: Secondary | ICD-10-CM | POA: Diagnosis not present

## 2019-05-26 DIAGNOSIS — M792 Neuralgia and neuritis, unspecified: Secondary | ICD-10-CM

## 2019-05-26 DIAGNOSIS — M779 Enthesopathy, unspecified: Secondary | ICD-10-CM

## 2019-05-26 NOTE — Progress Notes (Signed)
Subjective: Veronica Dudley is a 54 y.o. female patient who returns to office for follow-up evaluation of right foot pain.  Patient is also here for discuss of MRI results.  Patient reports that pain seems a little better but cannot stand or walk beyond 1 hour before she has symptoms.  No other pedal complaints noted.  Patient Active Problem List   Diagnosis Date Noted  . Breast asymmetry following reconstructive surgery 05/13/2014  . Malignant neoplasm of left breast ([facility]) 03/24/2014  . H/O bilateral oophorectomy 03/19/2014  . Ductal carcinoma in situ of breast 09/07/2011  . Iron deficiency anemia 09/07/2011    Current Outpatient Medications on File Prior to Visit  Medication Sig Dispense Refill  . ALPRAZolam (XANAX) 0.25 MG tablet Take 0.25 mg by mouth as needed.     . Ascorbic Acid (VITAMIN C) 1000 MG tablet Take 1,000 mg by mouth daily.    . cholecalciferol (VITAMIN D) 25 MCG (1000 UNIT) tablet Take 1,000 Units by mouth daily.     . Pediatric Multivit-Minerals-C (ONE-A-DAY SCOOBY-DOO GUMMIES PO) Take 1,000 mg by mouth 2 (two) times daily.     No current facility-administered medications on file prior to visit.    Allergies  Allergen Reactions  . Other     Cortisone injections - pt stated, "caused my chest to get tight; gave diarrhea really bad"    Objective:  General: Alert and oriented x3 in no acute distress  Dermatology: No open lesions bilateral lower extremities, no webspace macerations, no ecchymosis bilateral, all nails x 10 are well manicured.  Vascular: Dorsalis Pedis and Posterior Tibial pedal pulses palpable, Capillary Fill Time 3 seconds,(+) pedal hair growth bilateral, no edema bilateral lower extremities, Temperature gradient within normal limits.  Neurology: Gross sensation intact via light touch bilateral, subjective numbness and tingling to third and fourth toes on the right.  Subjective burning pain at the level of the right thigh.  Musculoskeletal:  Continued mild tenderness with palpation at right plantar arch along plantar fascial band pain is worse with extension of toes with likely some distal plantar fasciitis noted as well no pain to plantar fascial insertion on right. Strength within normal limits in all groups bilateral.   Assessment and Plan: Problem List Items Addressed This Visit    None    Visit Diagnoses    Right foot pain    -  Primary   Tendonitis       Plantar fasciitis       Neuritis          -Complete examination performed -MRI results reviewed which are negative and appears to be a normal exam with no findings that could explain patient's symptoms -Rx PT in [place]  -May continue with rest ice elevation and topical pain creams and rubs and soaks as needed like before -Patient to return to office if fails to improve after PT or sooner if condition worsens.  Landis Martins, DPM

## 2019-05-26 NOTE — Telephone Encounter (Signed)
Faxed PT orders to [facility].

## 2019-05-26 NOTE — Telephone Encounter (Signed)
-----   Message from Landis Martins, [place] sent at 05/26/2019  9:02 AM EST ----- Regarding: PT in [facility] for Left foot pain and tendonitis

## 2019-06-09 ENCOUNTER — Encounter (HOSPITAL_COMMUNITY): Payer: Self-pay

## 2019-06-09 ENCOUNTER — Ambulatory Visit (HOSPITAL_COMMUNITY): Payer: BC Managed Care – PPO | Attending: Sports Medicine

## 2019-06-09 ENCOUNTER — Other Ambulatory Visit: Payer: Self-pay

## 2019-06-09 DIAGNOSIS — M25571 Pain in right ankle and joints of right foot: Secondary | ICD-10-CM | POA: Diagnosis not present

## 2019-06-09 DIAGNOSIS — R2689 Other abnormalities of gait and mobility: Secondary | ICD-10-CM

## 2019-06-09 NOTE — Therapy (Signed)
[facility] [address] [place], [place], [address] Phone: [phone]   Fax:  [phone]  Physical Therapy Evaluation  Patient Details  Name: Veronica Dudley MRN: [id number] Date of Birth: [birth year] Referring Provider (PT): Landis Martins, [place]   Encounter Date: 06/09/2019  PT End of Session - 06/09/19 [id number]    Visit Number  1    Number of Visits  8    Date for PT Re-Evaluation  07/07/19    Authorization Type  BCBS    Authorization Time Period  06/09/19 to 07/07/19    PT Start Time  0815    PT Stop Time  0900    PT Time Calculation (min)  45 min    Activity Tolerance  Patient tolerated treatment well;No increased pain    Behavior During Therapy  WFL for tasks assessed/performed       Past Medical History:  Diagnosis Date  . Breast CA ([facility]) 09/07/2011  . Breast cancer ([facility]) 6/10   DCIS  . Cancer ([facility])    lt breast ca 2010/surg-lumpectomy/rad tx  . Endometriosis   . H/O bilateral oophorectomy 03/19/2014  . Iron deficiency anemia   . Iron deficiency anemia 09/07/2011  . PONV (postoperative nausea and vomiting)     Past Surgical History:  Procedure Laterality Date  . ABDOMINAL HYSTERECTOMY  2010  . BILATERAL OOPHORECTOMY Bilateral 2010  . CESAREAN SECTION    . CHOLECYSTECTOMY  2005   lap choli  . COLONOSCOPY N/A 05/12/2015   Procedure: COLONOSCOPY;  Surgeon: Rogene Houston, MD;  [facility];  Service: Endoscopy;  Laterality: N/A;  955 - moved to 1/12 @ 9:30  . DIAGNOSTIC LAPAROSCOPY  2004   incisional mass  . LAPAROSCOPY  2006   LOA  . LIPOSUCTION WITH LIPOFILLING Left 05/13/2014   Procedure: LIPOSUCTION WITH LIPOFILLING FOR SYMMETRY;  Surgeon: Theodoro Kos, DO;  Location: [facility];  Service: Plastics;  Laterality: Left;  liposuction from abdomen, lipofilling to left breast  . lumpectomy  2010   left lump-snbx    There were no vitals filed for this visit.   Subjective Assessment - 06/09/19  0823    Subjective  Pt reports R foot pain began 09/2018. Pt reports thinking she stepped wrong while exercises, continued with life for 2 months then noticed no feeling in her foot when she woke up and took a few steps. Pt reports PCP saw nothing in CT and Dr. Cannon Kettle saw 2 bone spurs; MRI was negative. Pt reports wearing CAM boot for 5 weeks with good results, but stopped wearing it because felt like her ankle was getting weak. Pt reports <30 minutes standing then she is ok. 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 Pt denies falls, weakness. Pt reports she did not get injections, but did try diclofenac (voltaren) but didn't notice any benefits. PT reports 5/10 pain at worst, typically just "uncomfortable" and not that high. Pt reports burning and numb feeling down lateral R thigh into 4th toe, crosses into plantar surface. Pt reports the feeling returns to normal once up and walking; pt reports sometimes the pain in thigh and foot wake her in the night but she does not lay on R hip when sleeping.    Pertinent History  breast CA    Limitations  Sitting    How long can you sit comfortably?  1 hour    How long can you stand comfortably?  [place]nex  minutes    How long can you walk comfortably?  30 minutes    Diagnostic tests  MRI- negative    Patient Stated Goals  to be able to shop with girlfriends without being uncomfortable    Currently in Pain?  Yes    Pain Score  1     Pain Location  Ankle    Pain Orientation  Right    Pain Descriptors / Indicators  Aching    Pain Type  Chronic pain    Pain Onset  More than a month ago    Pain Frequency  Constant    Aggravating Factors   standing, walking, sitting    Pain Relieving Factors  massage, heating pad    Effect of Pain on Daily Activities  limited         [facility] PT Assessment - 06/09/19 0001      Assessment   Medical Diagnosis  R Foot tendonitis     Referring Provider (PT)  Landis Martins, DPM    Onset  Date/Surgical Date  --   approximately June 2020   Next MD Visit  none scheduled    Prior Therapy  None      Precautions   Precautions  None      Restrictions   Weight Bearing Restrictions  No      Balance Screen   Has the patient fallen in the past 6 months  No    Has the patient had a decrease in activity level because of a fear of falling?   No    Is the patient reluctant to leave their home because of a fear of falling?   No      Prior Function   Level of Independence  Independent    Vocation  Full time employment    Vocation Requirements  Desk work    Leisure  [employer], shopping      Cognition   Overall Cognitive Status  Within Functional Limits for tasks assessed      Observation/Other Assessments   Observations  no edema, redness, deformity, callus formation noted throughout 5 toes    Focus on Therapeutic Outcomes (FOTO)   62% limited      Sensation   Light Touch  Appears Intact      Functional Tests   Functional tests  Sit to Stand      Sit to Stand   Comments  5x STS: 11.4 sec, from chair, no UE assist, equal weight-bearing      ROM / Strength   AROM / PROM / Strength  AROM;Strength      AROM   Overall AROM Comments  "pulling" sensation in 4th toe with DF, PF, ev    AROM Assessment Site  Ankle    Right/Left Ankle  Right    Right Ankle Dorsiflexion  18    Right Ankle Plantar Flexion  50    Right Ankle Inversion  26    Right Ankle Eversion  30      Strength   Strength Assessment Site  Ankle    Right/Left Ankle  Right;Left    Right Ankle Dorsiflexion  5/5    Right Ankle Plantar Flexion  5/5    Right Ankle Inversion  5/5    Right Ankle Eversion  5/5    Left Ankle Dorsiflexion  5/5    Left Ankle Plantar Flexion  5/5    Left Ankle Inversion  5/5    Left Ankle Eversion  5/5  Palpation   Palpation comment  tenderness to palpation throughout 4th digit, lateral foot distal to lateral mal, along peroneal muscle bellies      Ambulation/Gait    Ambulation/Gait  Yes    Assistive device  None    Gait Comments  ~5 deg R foot out-toeing compared to L foot, step through pattern, no limping noted, equal bil step length, no unsteadiness noted      Balance   Balance Assessed  Yes      Static Standing Balance   Static Standing - Balance Support  No upper extremity supported    Static Standing Balance -  Activities   Single Leg Stance - Right Leg;Single Leg Stance - Left Leg    Static Standing - Comment/# of Minutes  R: 11.3 sec, L: 30+ sec         Objective measurements completed on examination: See above findings.      PT Education - 06/09/19 0906    Education Details  Assessment findings, FOTO findings, established HEP, exercise technique, arch support vs. minimalist shoes    Person(s) Educated  Patient    Methods  Explanation;Demonstration;Handout    Comprehension  Verbalized understanding       PT Short Term Goals - 06/09/19 0915      PT SHORT TERM GOAL #1   Title  Pt will perform HEP at least 3x/week to improve muscle extensibility and reduce pain with functional activity.    Time  2    Period  Weeks    Status  New    Target Date  06/23/19        PT Long Term Goals - 06/09/19 0916      PT LONG TERM GOAL #1   Title  Pt will improve FOTO by 10% to indicate significant improvement in self perceived limitations and overall QoL.    Time  4    Period  Weeks    Status  New    Target Date  07/07/19      PT LONG TERM GOAL #2   Title  Pt will self report standing/walking for 2 hour with 2/10 R ankle foot pain at worst to allow her to shop with friends with reduced pain.    Time  4    Period  Weeks    Status  New      PT LONG TERM GOAL #3   Title  Pt will deny tenderness or abnormal sensation throughout 4th toe on R foot with ankle AROM to indicate improved muscle extensibility with mobility.    Time  4    Period  Weeks    Status  New             Plan - 06/09/19 0908    Clinical Impression Statement   Pt is a pleasant 54YO female with chronic R foot pain since June 2020. Pt demonstrates increased tenderness throughout R 4th toe and lateral edge of foot to just below distal mal. Upon further palpation, pt with tenderness throughout peroneal muscles and palpable restrictions. Pt with good R ankle AROM and strength per MMT, but demonstrates functional deficits with increased out-toeing during ambulation. Pt with increased pain with standing and ambulating for long duration without CAM boot protection. Pt also with R thigh burning sensation, possibly due to compensating for R ankle discomfort in standing and with CAM boot wearing. Pt would benefit from skilled PT intervention to improve muscle extensibility, functional strength, balance, and reduce pain with functional mobility  to improve overall QoL and return to PLOF.    Personal Factors and Comorbidities  Time since onset of injury/illness/exacerbation    Examination-Activity Limitations  Locomotion Level;Sit;Stand    Examination-Participation Restrictions  Cleaning;Community Activity;Laundry;Shop    Stability/Clinical Decision Making  Stable/Uncomplicated    Clinical Decision Making  Low    Rehab Potential  Good    PT Frequency  2x / week    PT Duration  4 weeks    PT Treatment/Interventions  ADLs/Self Care Home Management;Aquatic Therapy;DME Instruction;Gait training;Stair training;Functional mobility training;Therapeutic activities;Therapeutic exercise;Balance training;Neuromuscular re-education;Patient/family education;Orthotic Fit/Training;Manual techniques;Passive range of motion;Dry needling;Taping;Joint Manipulations    PT Next Visit Plan  Review goals, review HEP. Begin arch strengthening, R ankle strengthening and stretching, and manual STM to peroneal muscles to improve pain and reduce restrictions.    PT Home Exercise Plan  Eval: gastroc stretch, soleus stretch, piriformis stretch    Consulted and Agree with Plan of Care  Patient        Patient will benefit from skilled therapeutic intervention in order to improve the following deficits and impairments:  Abnormal gait, Decreased activity tolerance, Decreased balance, Difficulty walking, Impaired perceived functional ability, Impaired flexibility, Impaired sensation, Pain  Visit Diagnosis: Pain in right ankle and joints of right foot  Other abnormalities of gait and mobility     Problem List Patient Active Problem List   Diagnosis Date Noted  . Breast asymmetry following reconstructive surgery 05/13/2014  . Malignant neoplasm of left breast ([facility]) 03/24/2014  . H/O bilateral oophorectomy 03/19/2014  . Ductal carcinoma in situ of breast 09/07/2011  . Iron deficiency anemia 09/07/2011     Talbot Grumbling PT, DPT 06/09/19, 9:23 AM [facility] [address] [place], [place], [address] Phone: [phone]   Fax:  [phone]  Name: ALONIE CLORAN MRN: [id number] Date of Birth: 03-Nov-1965

## 2019-06-15 ENCOUNTER — Ambulatory Visit (HOSPITAL_COMMUNITY): Payer: BC Managed Care – PPO | Admitting: Physical Therapy

## 2019-06-15 ENCOUNTER — Telehealth (HOSPITAL_COMMUNITY): Payer: Self-pay | Admitting: Physical Therapy

## 2019-06-15 NOTE — Telephone Encounter (Signed)
cx today's appt due to power outage

## 2019-06-17 ENCOUNTER — Ambulatory Visit (HOSPITAL_COMMUNITY): Payer: BC Managed Care – PPO | Admitting: Physical Therapy

## 2019-06-17 DIAGNOSIS — M779 Enthesopathy, unspecified: Secondary | ICD-10-CM | POA: Diagnosis not present

## 2019-06-17 DIAGNOSIS — E7849 Other hyperlipidemia: Secondary | ICD-10-CM | POA: Diagnosis not present

## 2019-06-17 DIAGNOSIS — F419 Anxiety disorder, unspecified: Secondary | ICD-10-CM | POA: Diagnosis not present

## 2019-06-22 ENCOUNTER — Telehealth (HOSPITAL_COMMUNITY): Payer: Self-pay | Admitting: Physical Therapy

## 2019-06-22 ENCOUNTER — Telehealth (HOSPITAL_COMMUNITY): Payer: Self-pay

## 2019-06-22 NOTE — Telephone Encounter (Signed)
pt has been around someone with covid 19 and is going to be tested on tuesday. pt aware to bring in the results of the test if not done at a [facility]

## 2019-06-23 ENCOUNTER — Ambulatory Visit (HOSPITAL_COMMUNITY): Payer: BC Managed Care – PPO | Admitting: Physical Therapy

## 2019-06-24 ENCOUNTER — Ambulatory Visit (HOSPITAL_COMMUNITY): Payer: BC Managed Care – PPO

## 2019-06-29 ENCOUNTER — Telehealth (HOSPITAL_COMMUNITY): Payer: Self-pay

## 2019-06-29 NOTE — Telephone Encounter (Signed)
pt states she thinks she can do the excercises at home and would like to cancel all of her exsiting appts

## 2019-06-30 ENCOUNTER — Encounter (HOSPITAL_COMMUNITY): Payer: Self-pay

## 2019-06-30 ENCOUNTER — Ambulatory Visit (HOSPITAL_COMMUNITY): Payer: BC Managed Care – PPO

## 2019-06-30 NOTE — Therapy (Signed)
[facility] [place], [place], [address] Phone: [phone]   Fax:  [phone]  Patient Details  Name: Veronica Dudley MRN: [id number] Date of Birth: 02-10-1966 Referring Provider:  No ref. provider found  Encounter Date: 06/30/2019  PHYSICAL THERAPY DISCHARGE SUMMARY  Visits from Start of Care: Eval only 06/09/19  Current functional level related to goals / functional outcomes: See eval 06/09/19   Remaining deficits: See eval 06/09/19   Education / Equipment: See eval 06/09/19 Plan: Patient agrees to discharge.  Patient goals were not met. Patient is being discharged due to being pleased with the current functional level.  ?????      Per front office staff, "pt states she thinks she can do the excercises at home and would like to cancel all of her exsiting appts". Therapist is discharging pt at this time per pt request.   Talbot Grumbling PT, DPT 06/30/19, 8:30 AM [facility] [place], [place], [address] Phone: [phone]   Fax:  [phone]

## 2019-07-02 ENCOUNTER — Ambulatory Visit (HOSPITAL_COMMUNITY): Payer: BC Managed Care – PPO

## 2019-07-06 DIAGNOSIS — Z23 Encounter for immunization: Secondary | ICD-10-CM | POA: Diagnosis not present

## 2019-07-07 ENCOUNTER — Encounter (HOSPITAL_COMMUNITY): Payer: BC Managed Care – PPO

## 2019-07-09 ENCOUNTER — Encounter (HOSPITAL_COMMUNITY): Payer: BC Managed Care – PPO

## 2019-08-04 DIAGNOSIS — Z23 Encounter for immunization: Secondary | ICD-10-CM | POA: Diagnosis not present

## 2019-10-20 ENCOUNTER — Other Ambulatory Visit (HOSPITAL_COMMUNITY): Payer: Self-pay | Admitting: Nurse Practitioner

## 2019-10-20 DIAGNOSIS — Z1231 Encounter for screening mammogram for malignant neoplasm of breast: Secondary | ICD-10-CM

## 2019-11-10 DIAGNOSIS — R222 Localized swelling, mass and lump, trunk: Secondary | ICD-10-CM | POA: Diagnosis not present

## 2019-11-10 DIAGNOSIS — S20352A Superficial foreign body of left front wall of thorax, initial encounter: Secondary | ICD-10-CM | POA: Diagnosis not present

## 2019-11-17 ENCOUNTER — Encounter (HOSPITAL_COMMUNITY): Payer: BC Managed Care – PPO

## 2019-11-24 ENCOUNTER — Inpatient Hospital Stay (HOSPITAL_COMMUNITY): Payer: BC Managed Care – PPO | Attending: Hematology

## 2019-11-24 ENCOUNTER — Encounter (HOSPITAL_COMMUNITY): Payer: BC Managed Care – PPO

## 2019-11-25 ENCOUNTER — Other Ambulatory Visit: Payer: Self-pay

## 2019-11-25 ENCOUNTER — Ambulatory Visit (HOSPITAL_COMMUNITY)
Admission: RE | Admit: 2019-11-25 | Discharge: 2019-11-25 | Disposition: A | Discharge status: Home / Self Care | Payer: BC Managed Care – PPO | Source: Ambulatory Visit | Attending: Nurse Practitioner | Admitting: Nurse Practitioner

## 2019-11-25 ENCOUNTER — Encounter (HOSPITAL_COMMUNITY): Payer: Self-pay

## 2019-11-25 DIAGNOSIS — Z1231 Encounter for screening mammogram for malignant neoplasm of breast: Secondary | ICD-10-CM | POA: Insufficient documentation

## 2019-11-26 ENCOUNTER — Inpatient Hospital Stay (HOSPITAL_COMMUNITY): Payer: BC Managed Care – PPO | Admitting: Nurse Practitioner

## 2019-11-27 ENCOUNTER — Other Ambulatory Visit (HOSPITAL_COMMUNITY): Payer: Self-pay

## 2019-11-27 ENCOUNTER — Ambulatory Visit (HOSPITAL_COMMUNITY): Payer: BC Managed Care – PPO | Admitting: Nurse Practitioner

## 2019-11-27 DIAGNOSIS — D500 Iron deficiency anemia secondary to blood loss (chronic): Secondary | ICD-10-CM

## 2019-11-27 DIAGNOSIS — D0512 Intraductal carcinoma in situ of left breast: Secondary | ICD-10-CM

## 2019-11-30 ENCOUNTER — Other Ambulatory Visit: Payer: Self-pay

## 2019-11-30 ENCOUNTER — Inpatient Hospital Stay (HOSPITAL_COMMUNITY): Payer: BC Managed Care – PPO | Attending: Hematology

## 2019-11-30 DIAGNOSIS — Z800 Family history of malignant neoplasm of digestive organs: Secondary | ICD-10-CM | POA: Insufficient documentation

## 2019-11-30 DIAGNOSIS — Z853 Personal history of malignant neoplasm of breast: Secondary | ICD-10-CM | POA: Diagnosis not present

## 2019-11-30 DIAGNOSIS — Z87891 Personal history of nicotine dependence: Secondary | ICD-10-CM | POA: Diagnosis not present

## 2019-11-30 DIAGNOSIS — Z8041 Family history of malignant neoplasm of ovary: Secondary | ICD-10-CM | POA: Diagnosis not present

## 2019-11-30 DIAGNOSIS — K7689 Other specified diseases of liver: Secondary | ICD-10-CM | POA: Insufficient documentation

## 2019-11-30 DIAGNOSIS — D509 Iron deficiency anemia, unspecified: Secondary | ICD-10-CM | POA: Insufficient documentation

## 2019-11-30 DIAGNOSIS — Z923 Personal history of irradiation: Secondary | ICD-10-CM | POA: Insufficient documentation

## 2019-11-30 DIAGNOSIS — D500 Iron deficiency anemia secondary to blood loss (chronic): Secondary | ICD-10-CM

## 2019-11-30 DIAGNOSIS — K5900 Constipation, unspecified: Secondary | ICD-10-CM | POA: Insufficient documentation

## 2019-11-30 DIAGNOSIS — D0512 Intraductal carcinoma in situ of left breast: Secondary | ICD-10-CM

## 2019-11-30 LAB — IRON AND TIBC
Iron: 129 ug/dL (ref 28–170)
Saturation Ratios: 36 % — ABNORMAL HIGH (ref 10.4–31.8)
TIBC: 359 ug/dL (ref 250–450)
UIBC: 230 ug/dL

## 2019-11-30 LAB — COMPREHENSIVE METABOLIC PANEL
ALT: 18 U/L (ref 0–44)
AST: 24 U/L (ref 15–41)
Albumin: 4.4 g/dL (ref 3.5–5.0)
Alkaline Phosphatase: 51 U/L (ref 38–126)
Anion gap: 10 (ref 5–15)
BUN: 20 mg/dL (ref 6–20)
CO2: 26 mmol/L (ref 22–32)
Calcium: 9.6 mg/dL (ref 8.9–10.3)
Chloride: 104 mmol/L (ref 98–111)
Creatinine, Ser: 0.68 mg/dL (ref 0.44–1.00)
GFR calc Af Amer: >60 mL/min (ref >60)
GFR calc non Af Amer: >60 mL/min (ref >60)
Glucose, Bld: 105 mg/dL — ABNORMAL HIGH (ref 70–99)
Potassium: 4 mmol/L (ref 3.5–5.1)
Sodium: 140 mmol/L (ref 135–145)
Total Bilirubin: 0.8 mg/dL (ref 0.3–1.2)
Total Protein: 7.5 g/dL (ref 6.5–8.1)

## 2019-11-30 LAB — CBC WITH DIFFERENTIAL/PLATELET
Abs Immature Granulocytes: 0.01 10*3/uL (ref 0.00–0.07)
Basophils Absolute: 0 10*3/uL (ref 0.0–0.1)
Basophils Relative: 1 %
Eosinophils Absolute: 0.2 10*3/uL (ref 0.0–0.5)
Eosinophils Relative: 4 %
HCT: 37.3 % (ref 36.0–46.0)
Hemoglobin: 11.5 g/dL — ABNORMAL LOW (ref 12.0–15.0)
Immature Granulocytes: 0 %
Lymphocytes Relative: 29 %
Lymphs Abs: 1.5 10*3/uL (ref 0.7–4.0)
MCH: 28 pg (ref 26.0–34.0)
MCHC: 30.8 g/dL (ref 30.0–36.0)
MCV: 90.8 fL (ref 80.0–100.0)
Monocytes Absolute: 0.3 10*3/uL (ref 0.1–1.0)
Monocytes Relative: 6 %
Neutro Abs: 3.2 10*3/uL (ref 1.7–7.7)
Neutrophils Relative %: 60 %
Platelets: 228 10*3/uL (ref 150–400)
RBC: 4.11 MIL/uL (ref 3.87–5.11)
RDW: 13.2 % (ref 11.5–15.5)
WBC: 5.1 10*3/uL (ref 4.0–10.5)
nRBC: 0 % (ref 0.0–0.2)

## 2019-11-30 LAB — VITAMIN B12: Vitamin B-12: 399 pg/mL (ref 180–914)

## 2019-11-30 LAB — FERRITIN: Ferritin: 59 ng/mL (ref 11–307)

## 2019-11-30 LAB — VITAMIN D 25 HYDROXY (VIT D DEFICIENCY, FRACTURES): Vit D, 25-Hydroxy: 37.69 ng/mL (ref 30–100)

## 2019-11-30 LAB — FOLATE: Folate: 60.8 ng/mL (ref >5.9)

## 2019-11-30 LAB — LACTATE DEHYDROGENASE: LDH: 114 U/L (ref 98–192)

## 2019-12-03 ENCOUNTER — Inpatient Hospital Stay (HOSPITAL_BASED_OUTPATIENT_CLINIC_OR_DEPARTMENT_OTHER): Payer: BC Managed Care – PPO | Admitting: Nurse Practitioner

## 2019-12-03 DIAGNOSIS — D0512 Intraductal carcinoma in situ of left breast: Secondary | ICD-10-CM

## 2019-12-03 DIAGNOSIS — Z1231 Encounter for screening mammogram for malignant neoplasm of breast: Secondary | ICD-10-CM | POA: Diagnosis not present

## 2019-12-03 NOTE — Progress Notes (Signed)
[facility] Follow up:    Sharilyn Sites, MD [address] [place] [address]   DIAGNOSIS: Breast cancer   Cancer Staging Ductal carcinoma in situ of breast Staging form: Breast, AJCC 7th Edition - Clinical stage from 04/22/2015: Stage 0 (Tis (DCIS), N0, M0) - Signed by [facility], PA-C on 04/22/2015   CURRENT THERAPY: Surveillance  INTERVAL HISTORY: Veronica Dudley 54 y.o. female was called for a telephone visit for breast cancer.  Patient reports she is doing well since her last visit.  She denies any lumps or bumps present.  She denies any new bone pain.Denies any nausea, vomiting, or diarrhea. Denies any new pains. Had not noticed any recent bleeding such as epistaxis, hematuria or hematochezia. Denies recent chest pain on exertion, shortness of breath on minimal exertion, pre-syncopal episodes, or palpitations. Denies any numbness or tingling in hands or feet. Denies any recent fevers, infections, or recent hospitalizations. Patient reports appetite at 100% and energy level at 100%.  She is eating well maintain her weight at this time.     Patient Active Problem List   Diagnosis Date Noted  . Breast asymmetry following reconstructive surgery 05/13/2014  . Malignant neoplasm of left breast ([facility]) 03/24/2014  . H/O bilateral oophorectomy 03/19/2014  . Ductal carcinoma in situ of breast 09/07/2011  . Iron deficiency anemia 09/07/2011    is allergic to other.  MEDICAL HISTORY: Past Medical History:  Diagnosis Date  . Breast CA ([facility]) 09/07/2011  . Breast cancer ([facility]) 6/10   DCIS  . Cancer ([facility])    lt breast ca 2010/surg-lumpectomy/rad tx  . Endometriosis   . H/O bilateral oophorectomy 03/19/2014  . Iron deficiency anemia   . Iron deficiency anemia 09/07/2011  . PONV (postoperative nausea and vomiting)     SURGICAL HISTORY: Past Surgical History:  Procedure Laterality Date  . ABDOMINAL HYSTERECTOMY  2010  . BILATERAL OOPHORECTOMY  Bilateral 2010  . CESAREAN SECTION    . CHOLECYSTECTOMY  2005   lap choli  . COLONOSCOPY N/A 05/12/2015   Procedure: COLONOSCOPY;  Surgeon: Rogene Houston, MD;  [facility];  Service: Endoscopy;  Laterality: N/A;  955 - moved to 1/12 @ 9:30  . DIAGNOSTIC LAPAROSCOPY  2004   incisional mass  . LAPAROSCOPY  2006   LOA  . LIPOSUCTION WITH LIPOFILLING Left 05/13/2014   Procedure: LIPOSUCTION WITH LIPOFILLING FOR SYMMETRY;  Surgeon: Theodoro Kos, DO;  Location: [facility];  Service: Plastics;  Laterality: Left;  liposuction from abdomen, lipofilling to left breast  . lumpectomy  2010   left lump-snbx    SOCIAL HISTORY: Social History   Socioeconomic History  . Marital status: Married    Spouse name: Not on file  . Number of children: Not on file  . Years of education: Not on file  . Highest education level: Not on file  Occupational History  . Not on file  Tobacco Use  . Smoking status: Former Smoker    Quit date: 05/06/1996    Years since quitting: 23.5  . Smokeless tobacco: Never Used  Substance and Sexual Activity  . Alcohol use: Yes    Alcohol/week: 2.0 standard drinks    Types: 2 Glasses of wine per week    Comment: red wine, glass a night  . Drug use: No  . Sexual activity: Yes  Other Topics Concern  . Not on file  Social History Narrative  . Not on file   Social  Determinants of Health   Financial Resource Strain:   . Difficulty of Paying Living Expenses:   Food Insecurity:   . Worried About Charity fundraiser in the Last Year:   . Arboriculturist in the Last Year:   Transportation Needs:   . Film/video editor (Medical):   Marland Kitchen Lack of Transportation (Non-Medical):   Physical Activity:   . Days of Exercise per Week:   . Minutes of Exercise per Session:   Stress:   . Feeling of Stress :   Social Connections:   . Frequency of Communication with Friends and Family:   . Frequency of Social Gatherings with Friends and Family:   .  Attends Religious Services:   . Active Member of Clubs or Organizations:   . Attends Archivist Meetings:   Marland Kitchen Marital Status:   Intimate Partner Violence:   . Fear of Current or Ex-Partner:   . Emotionally Abused:   Marland Kitchen Physically Abused:   . Sexually Abused:     FAMILY HISTORY: Family History  Problem Relation Age of Onset  . Heart attack Father   . Cancer Maternal Aunt        ovarian cancer  . Colon cancer Maternal Aunt     Review of Systems  All other systems reviewed and are negative.   Vital signs: -Deferred due to telephone visit  Physical Exam -Deferred due to telephone visit -Patient was alert and oriented over the phone in no acute distress    LABORATORY DATA:  CBC    Component Value Date/Time   WBC 5.1 11/30/2019 0928   RBC 4.11 11/30/2019 0928   HGB 11.5 (L) 11/30/2019 0928   HCT 37.3 11/30/2019 0928   PLT 228 11/30/2019 0928   MCV 90.8 11/30/2019 0928   MCH 28.0 11/30/2019 0928   MCHC 30.8 11/30/2019 0928   RDW 13.2 11/30/2019 0928   LYMPHSABS 1.5 11/30/2019 0928   MONOABS 0.3 11/30/2019 0928   EOSABS 0.2 11/30/2019 0928   BASOSABS 0.0 11/30/2019 0928    CMP     Component Value Date/Time   NA 140 11/30/2019 0928   K 4.0 11/30/2019 0928   CL 104 11/30/2019 0928   CO2 26 11/30/2019 0928   GLUCOSE 105 (H) 11/30/2019 0928   BUN 20 11/30/2019 0928   CREATININE 0.68 11/30/2019 0928   CALCIUM 9.6 11/30/2019 0928   PROT 7.5 11/30/2019 0928   ALBUMIN 4.4 11/30/2019 0928   AST 24 11/30/2019 0928   ALT 18 11/30/2019 0928   ALKPHOS 51 11/30/2019 0928   BILITOT 0.8 11/30/2019 0928   GFRNONAA >60 11/30/2019 0928   GFRAA >60 11/30/2019 4287    All questions were answered to patient's stated satisfaction. Encouraged patient to call with any new concerns or questions before his next visit to the cancer center and we can certain see him sooner, if needed.     ASSESSMENT and THERAPY PLAN:   Ductal carcinoma in situ of breast 1. Ductal  carcinoma in situ (DCIS) for the left breast: - Status post lumpectomy 10/22/2008 followed by XRT finishing on 01/21/2009. -DCIS was intermediate grade and measured 2.7 cm close to the chest wall, ER positive, PR negative.  2 mm of negative margins were obtained at the time of the surgery. -She refused anti-estrogen therapy. -She is status post breast reconstruction due to asymmetry by Dr. Loel Lofty Sanger on 05/13/2014. -She was a candidate for genetic counseling but did not show up for her January 2017  appointment with genetic counselor Roma Kayser. - She had a screening mammogram on 11/06/2018 that was BI-RADS Category 0 incomplete.  It showed possible mass within left breast.  Right breast showed no signs of malignancy. -Her last mammogram was on 11/25/2019 which was BI-RADS Category 1 negative.  -Labs done on 11/30/2019 showed WBC 5.1, hemoglobin 11.5, platelets 228, creatinine 0.68, LDH 114 - She will follow-up in 1 year with repeat labs and mammogram.  2.  Iron deficiency anemia: - She was last treated with IV iron 05/05/2015. - Labs on 11/30/2019 showed hemoglobin 11.5, ferritin 59, percent saturation 36 -She reports her energy is good.  She denies any bright red bleeding per rectum or melena..  She is taking Colace twice a day and her constipation has improved and she has not seen any more blood. - We will recheck her labs in 6 months.  She is to call the office sooner if she starts seeing any more blood in her stool or becomes fatigued.  3.  Liver cyst: - Her last CT of abdomen and pelvis done on 10/05/2014 showed a stable 11 mm hepatic dome cyst. -She follows up with Dr. Melony Overly for this.   Orders Placed This Encounter  Procedures  . MM 3D SCREEN BREAST BILATERAL    Standing Status:   Future    Standing Expiration Date:   12/02/2020    Order Specific Question:   Reason for Exam (SYMPTOM  OR DIAGNOSIS REQUIRED)    Answer:   breast cacner    Order Specific Question:   Is the patient pregnant?     Answer:   No    Order Specific Question:   Preferred imaging location?    Answer:   [facility]  . Lactate dehydrogenase    Standing Status:   Future    Standing Expiration Date:   12/02/2020  . CBC with Differential/Platelet    Standing Status:   Future    Standing Expiration Date:   12/02/2020  . Comprehensive metabolic panel    Standing Status:   Future    Standing Expiration Date:   12/02/2020  . Ferritin    Standing Status:   Future    Standing Expiration Date:   12/02/2020  . Iron and TIBC    Standing Status:   Future    Standing Expiration Date:   12/02/2020  . Vitamin B12    Standing Status:   Future    Standing Expiration Date:   12/02/2020  . VITAMIN D 25 Hydroxy (Vit-D Deficiency, Fractures)    Standing Status:   Future    Standing Expiration Date:   12/02/2020  . Folate    Standing Status:   Future    Standing Expiration Date:   12/02/2020    All questions were answered. The patient knows to call the clinic with any problems, questions or concerns. We can certainly see the patient much sooner if necessary. This note was electronically signed.  I provided 29 minutes of non face-to-face telephone visit time during this encounter, and > 50% was spent counseling as documented under my assessment & plan.   Glennie [place], NP-C 12/03/2019

## 2019-12-03 NOTE — Assessment & Plan Note (Signed)
1. 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 -DCIS was intermediate grade and measured 2.7 cm close to the chest wall, ER positive, PR negative.  2 mm of negative margins were obtained at the time of the surgery. -She refused anti-estrogen therapy. -She is status post breast reconstruction due to asymmetry by Dr. Loel Lofty Sanger on 05/13/2014. -She was a candidate for genetic counseling but did not show up for her January 2017 appointment with genetic counselor Roma Kayser. - She had a screening mammogram on 11/06/2018 that was BI-RADS Category 0 incomplete.  It showed possible mass within left breast.  Right breast showed no signs of malignancy. -Her last mammogram was on 11/25/2019 which was BI-RADS Category 1 negative.  -Labs done on 11/30/2019 showed WBC 5.1, hemoglobin 11.5, platelets 228, creatinine 0.68, LDH 114 - She will follow-up in 1 year with repeat labs and mammogram.  2.  Iron deficiency anemia: - She was last treated with IV iron 05/05/2015. - Labs on 11/30/2019 showed hemoglobin 11.5, ferritin 59, percent saturation 36 -She reports her energy is good.  She denies any bright red bleeding per rectum or melena..  She is taking Colace twice a day and her constipation has improved and she has not seen any more blood. - We will recheck her labs in 6 months.  She is to call the office sooner if she starts seeing any more blood in her stool or becomes fatigued.  3.  Liver cyst: - Her last CT of abdomen and pelvis done on 10/05/2014 showed a stable 11 mm hepatic dome cyst. -She follows up with Dr. Melony Overly for this.

## 2020-01-14 DIAGNOSIS — F419 Anxiety disorder, unspecified: Secondary | ICD-10-CM | POA: Diagnosis not present

## 2020-04-19 DIAGNOSIS — Z01419 Encounter for gynecological examination (general) (routine) without abnormal findings: Secondary | ICD-10-CM | POA: Diagnosis not present

## 2020-04-26 ENCOUNTER — Encounter (INDEPENDENT_AMBULATORY_CARE_PROVIDER_SITE_OTHER): Payer: Self-pay | Admitting: *Deleted

## 2020-07-25 ENCOUNTER — Telehealth (INDEPENDENT_AMBULATORY_CARE_PROVIDER_SITE_OTHER): Payer: Self-pay | Admitting: *Deleted

## 2020-07-25 ENCOUNTER — Encounter (INDEPENDENT_AMBULATORY_CARE_PROVIDER_SITE_OTHER): Payer: Self-pay | Admitting: *Deleted

## 2020-07-25 ENCOUNTER — Other Ambulatory Visit (INDEPENDENT_AMBULATORY_CARE_PROVIDER_SITE_OTHER): Payer: Self-pay | Admitting: *Deleted

## 2020-07-25 NOTE — Telephone Encounter (Signed)
Patient needs trilyte 

## 2020-07-25 NOTE — Telephone Encounter (Signed)
Referring MD/PCP: fusco   Procedure: tcs  Reason/Indication:  fam hx colon ca  Has patient had this procedure before?  Yes, 2017  If so, when, by whom and where?    Is there a family history of colon cancer?  Yes, maternal aunt  Who?  What age when diagnosed?    Is patient diabetic?   no      Does patient have prosthetic heart valve or mechanical valve?  no  Do you have a pacemaker/defibrillator?  no  Has patient ever had endocarditis/atrial fibrillation? no  Have you had a stroke/heart attack last 6 mths? no  Does patient use oxygen? no  Has patient had joint replacement within last 12 months?  no  Is patient constipated or do they take laxatives? no  Does patient have a history of alcohol/drug use?  no  Is patient on blood thinner such as Coumadin, Plavix and/or Aspirin? no  Do you take medicine for weight loss. no  Medications: none  Allergies: nkda  Medication Adjustment per Dr Rehman/Dr Jenetta Downer   Procedure date & time: 08/04/20

## 2020-07-26 ENCOUNTER — Other Ambulatory Visit (INDEPENDENT_AMBULATORY_CARE_PROVIDER_SITE_OTHER): Payer: Self-pay

## 2020-07-26 DIAGNOSIS — Z800 Family history of malignant neoplasm of digestive organs: Secondary | ICD-10-CM

## 2020-07-26 MED ORDER — PEG 3350-KCL-NA BICARB-NACL 420 G PO SOLR: 4000.0000 mL | Freq: Once | ORAL | 0 refills | Status: AC

## 2020-07-26 NOTE — Telephone Encounter (Signed)
Done

## 2020-08-02 ENCOUNTER — Other Ambulatory Visit (HOSPITAL_COMMUNITY)
Admission: RE | Admit: 2020-08-02 | Discharge: 2020-08-02 | Disposition: A | Discharge status: Home / Self Care | Payer: BC Managed Care – PPO | Source: Ambulatory Visit | Attending: Internal Medicine | Admitting: Internal Medicine

## 2020-08-02 ENCOUNTER — Other Ambulatory Visit: Payer: Self-pay

## 2020-08-02 DIAGNOSIS — Z20822 Contact with and (suspected) exposure to covid-19: Secondary | ICD-10-CM | POA: Insufficient documentation

## 2020-08-02 DIAGNOSIS — Z01812 Encounter for preprocedural laboratory examination: Secondary | ICD-10-CM | POA: Insufficient documentation

## 2020-08-02 DIAGNOSIS — Z800 Family history of malignant neoplasm of digestive organs: Secondary | ICD-10-CM | POA: Diagnosis not present

## 2020-08-02 DIAGNOSIS — K644 Residual hemorrhoidal skin tags: Secondary | ICD-10-CM | POA: Diagnosis not present

## 2020-08-02 DIAGNOSIS — K635 Polyp of colon: Secondary | ICD-10-CM | POA: Diagnosis not present

## 2020-08-02 DIAGNOSIS — Z1211 Encounter for screening for malignant neoplasm of colon: Secondary | ICD-10-CM | POA: Diagnosis not present

## 2020-08-02 DIAGNOSIS — D123 Benign neoplasm of transverse colon: Secondary | ICD-10-CM | POA: Diagnosis not present

## 2020-08-02 DIAGNOSIS — K6389 Other specified diseases of intestine: Secondary | ICD-10-CM | POA: Diagnosis not present

## 2020-08-03 ENCOUNTER — Telehealth (INDEPENDENT_AMBULATORY_CARE_PROVIDER_SITE_OTHER): Payer: Self-pay

## 2020-08-03 ENCOUNTER — Encounter (INDEPENDENT_AMBULATORY_CARE_PROVIDER_SITE_OTHER): Payer: Self-pay

## 2020-08-03 LAB — SARS CORONAVIRUS 2 (TAT 6-24 HRS): SARS Coronavirus 2: NEGATIVE

## 2020-08-03 MED ORDER — PEG 3350-KCL-NA BICARB-NACL 420 G PO SOLR: 4000.0000 mL | ORAL | 0 refills | Status: DC

## 2020-08-03 NOTE — Telephone Encounter (Signed)
Veronica Dudley, CMA  

## 2020-08-04 ENCOUNTER — Encounter (HOSPITAL_COMMUNITY): Payer: Self-pay | Admitting: Internal Medicine

## 2020-08-04 ENCOUNTER — Ambulatory Visit (HOSPITAL_COMMUNITY)
Admission: RE | Admit: 2020-08-04 | Discharge: 2020-08-04 | Disposition: A | Discharge status: Home / Self Care | Payer: BC Managed Care – PPO | Attending: Internal Medicine | Admitting: Internal Medicine

## 2020-08-04 ENCOUNTER — Encounter (HOSPITAL_COMMUNITY)
Admission: RE | Disposition: A | Discharge status: Home / Self Care | Payer: Self-pay | Source: Home / Self Care | Attending: Internal Medicine

## 2020-08-04 ENCOUNTER — Other Ambulatory Visit: Payer: Self-pay

## 2020-08-04 DIAGNOSIS — K644 Residual hemorrhoidal skin tags: Secondary | ICD-10-CM | POA: Insufficient documentation

## 2020-08-04 DIAGNOSIS — K635 Polyp of colon: Secondary | ICD-10-CM | POA: Diagnosis not present

## 2020-08-04 DIAGNOSIS — K6389 Other specified diseases of intestine: Secondary | ICD-10-CM | POA: Insufficient documentation

## 2020-08-04 DIAGNOSIS — D123 Benign neoplasm of transverse colon: Secondary | ICD-10-CM | POA: Insufficient documentation

## 2020-08-04 DIAGNOSIS — Z20822 Contact with and (suspected) exposure to covid-19: Secondary | ICD-10-CM | POA: Insufficient documentation

## 2020-08-04 DIAGNOSIS — Z800 Family history of malignant neoplasm of digestive organs: Secondary | ICD-10-CM | POA: Diagnosis not present

## 2020-08-04 DIAGNOSIS — D127 Benign neoplasm of rectosigmoid junction: Secondary | ICD-10-CM | POA: Diagnosis not present

## 2020-08-04 DIAGNOSIS — K6289 Other specified diseases of anus and rectum: Secondary | ICD-10-CM | POA: Diagnosis not present

## 2020-08-04 DIAGNOSIS — Z1211 Encounter for screening for malignant neoplasm of colon: Secondary | ICD-10-CM | POA: Insufficient documentation

## 2020-08-04 HISTORY — PX: COLONOSCOPY: SHX5424

## 2020-08-04 HISTORY — PX: POLYPECTOMY: SHX5525

## 2020-08-04 LAB — HM COLONOSCOPY

## 2020-08-04 SURGERY — COLONOSCOPY
Anesthesia: Moderate Sedation

## 2020-08-04 MED ORDER — STERILE WATER FOR IRRIGATION IR SOLN
Status: DC | PRN
  Administered 2020-08-04: 1.5 mL

## 2020-08-04 MED ORDER — MIDAZOLAM HCL 5 MG/5ML IJ SOLN
INTRAMUSCULAR | Status: DC | PRN
  Administered 2020-08-04: 1 mg via INTRAVENOUS
  Administered 2020-08-04: 2 mg via INTRAVENOUS
  Administered 2020-08-04: 1 mg via INTRAVENOUS
  Administered 2020-08-04 (×3): 2 mg via INTRAVENOUS

## 2020-08-04 MED ORDER — MIDAZOLAM HCL 5 MG/5ML IJ SOLN
INTRAMUSCULAR | Status: AC
  Filled 2020-08-04: qty 10

## 2020-08-04 MED ORDER — MEPERIDINE HCL 50 MG/ML IJ SOLN
INTRAMUSCULAR | Status: DC | PRN
  Administered 2020-08-04 (×2): 25 mg via INTRAVENOUS

## 2020-08-04 MED ORDER — SODIUM CHLORIDE 0.9 % IV SOLN: INTRAVENOUS | Status: DC

## 2020-08-04 MED ORDER — MEPERIDINE HCL 50 MG/ML IJ SOLN
INTRAMUSCULAR | Status: AC
  Filled 2020-08-04: qty 1

## 2020-08-04 NOTE — Op Note (Signed)
[facility] Patient Name: Veronica Dudley Procedure Date: 08/04/2020 10:00 AM MRN: [id number] Date of Birth: 1966/03/05 Attending MD: Hildred Laser , MD CSN: [id number] Age: 55 Admit Type: Outpatient Procedure:                Colonoscopy Indications:              Colon cancer screening in patient at increased                            risk: Family history of colorectal cancer in                            multiple 2nd degree relatives Providers:                Hildred Laser, MD, Gwenlyn Fudge, RN, Nelma Rothman,                            Technician Referring MD:             Halford Chessman, MD Medicines:                Meperidine 50 mg IV, Midazolam 10 mg IV Complications:            No immediate complications. Estimated Blood Loss:     Estimated blood loss was minimal. Procedure:                Pre-Anesthesia Assessment:                           - Prior to the procedure, a History and Physical                            was performed, and patient medications and                            allergies were reviewed. The patient's tolerance of                            previous anesthesia was also reviewed. The risks                            and benefits of the procedure and the sedation                            options and risks were discussed with the patient.                            All questions were answered, and informed consent                            was obtained. Prior Anticoagulants: The patient has                            taken no previous anticoagulant or antiplatelet  agents. ASA Grade Assessment: II - A patient with                            mild systemic disease. After reviewing the risks                            and benefits, the patient was deemed in                            satisfactory condition to undergo the procedure.                           After obtaining informed consent, the colonoscope                             was passed under direct vision. Throughout the                            procedure, the patient's blood pressure, pulse, and                            oxygen saturations were monitored continuously. The                            [id number]-[id number] ([id number]) scope was introduced through                            the anus and advanced to the the cecum, identified                            by appendiceal orifice and ileocecal valve. The                            colonoscopy was somewhat difficult due to                            significant looping. Successful completion of the                            procedure was aided by changing the patient to a                            supine position, using manual pressure and scope                            guide. The patient tolerated the procedure well.                            The quality of the bowel preparation was good. The                            ileocecal valve, appendiceal orifice, and rectum  were photographed. Scope In: 10:50:34 AM Scope Out: 11:18:33 AM Scope Withdrawal Time: 0 hours 12 minutes 10 seconds  Total Procedure Duration: 0 hours 27 minutes 59 seconds  Findings:      The perianal and digital rectal examinations were normal.      A diffuse area of mild melanosis was found in the entire colon.      A 7 mm polyp was found in the splenic flexure. The polyp was removed       with a cold snare. Resection and retrieval were complete. The pathology       specimen was placed into Bottle Number 1.      A diminutive polyp was found in the recto-sigmoid colon. The polyp was       sessile. Biopsies were taken with a cold forceps for histology. The       pathology specimen was placed into Bottle Number 2.      External hemorrhoids were found during retroflexion. The hemorrhoids       were small. Impression:               - Melanosis in the colon.                           - One 7 mm polyp at the splenic  flexure, removed                            with a cold snare. Resected and retrieved.                           - One diminutive polyp at the recto-sigmoid colon.                            Biopsied.                           - External hemorrhoids. Moderate Sedation:      Per Anesthesia Care Recommendation:           - Patient has a contact number available for                            emergencies. The signs and symptoms of potential                            delayed complications were discussed with the                            patient. Return to normal activities tomorrow.                            Written discharge instructions were provided to the                            patient.                           - High fiber diet today.                           -  Continue present medications.                           - No aspirin, ibuprofen, naproxen, or other                            non-steroidal anti-inflammatory drugs for 1 day.                           - Await pathology results.                           - Repeat colonoscopy is recommended. The                            colonoscopy date will be determined after pathology                            results from today's exam become available for                            review. Procedure Code(s):        --- Professional ---                           [phone], Colonoscopy, flexible; with removal of                            tumor(s), polyp(s), or other lesion(s) by snare                            technique                           45380, 42, Colonoscopy, flexible; with biopsy,                            single or multiple Diagnosis Code(s):        --- Professional ---                           Z80.0, Family history of malignant neoplasm of                            digestive organs                           K63.89, Other specified diseases of intestine                           K63.5, Polyp of colon                            K64.4, Residual hemorrhoidal skin tags CPT copyright 2019 American Medical Association. All rights reserved. The codes documented in this report are preliminary and upon coder review may  be revised to meet current compliance requirements. Hildred Laser, MD Hildred Laser, MD 08/04/2020  11:26:57 AM This report has been signed electronically. Number of Addenda: 0

## 2020-08-04 NOTE — Progress Notes (Signed)
Please excuse Veronica Dudley from work , she cannot drive, operate machinery, or sign legal documents until after Noon 08/05/2020.  She may return to work by 12 noon on Friday August 05, 2020.

## 2020-08-04 NOTE — Discharge Instructions (Signed)
No aspirin or NSAIDs for 24 hours. Resume usual medications and high-fiber diet as before. No driving for 24 hours. Physician will call with biopsy results.       Colonoscopy, Adult, Care After This sheet gives you information about how to care for yourself after your procedure. Your doctor may also give you more specific instructions. If you have problems or questions, call your doctor. What can I expect after the procedure? After the procedure, it is common to have:  A small amount of blood in your poop (stool) for 24 hours.  Some gas.  Mild cramping or bloating in your belly (abdomen). Follow these instructions at home: Eating and drinking  Drink enough fluid to keep your pee (urine) pale yellow.  Follow instructions from your doctor about what you cannot eat or drink.  Return to your normal diet as told by your doctor. Avoid heavy or fried foods that are hard to digest.   Activity  Rest as told by your doctor.  Do not sit for a long time without moving. Get up to take short walks every 1-2 hours. This is important. Ask for help if you feel weak or unsteady.  Return to your normal activities as told by your doctor. Ask your doctor what activities are safe for you. To help cramping and bloating:  Try walking around.  Put heat on your belly as told by your doctor. Use the heat source that your doctor recommends, such as a moist heat pack or a heating pad. ? Put a towel between your skin and the heat source. ? Leave the heat on for 20-30 minutes. ? Remove the heat if your skin turns bright red. This is very important if you are unable to feel pain, heat, or cold. You may have a greater risk of getting burned.   General instructions  If you were given a medicine to help you relax (sedative) during your procedure, it can affect you for many hours. Do not drive or use machinery until your doctor says that it is safe.  For the first 24 hours after the procedure: ? Do not  sign important documents. ? Do not drink alcohol. ? Do your daily activities more slowly than normal. ? Eat foods that are soft and easy to digest.  Take over-the-counter or prescription medicines only as told by your doctor.  Keep all follow-up visits as told by your doctor. This is important. Contact a doctor if:  You have blood in your poop 2-3 days after the procedure. Get help right away if:  You have more than a small amount of blood in your poop.  You see large clumps of tissue (blood clots) in your poop.  Your belly is swollen.  You feel like you may vomit (nauseous).  You vomit.  You have a fever.  You have belly pain that gets worse, and medicine does not help your pain. Summary  After the procedure, it is common to have a small amount of blood in your poop. You may also have mild cramping and bloating in your belly.  If you were given a medicine to help you relax (sedative) during your procedure, it can affect you for many hours. Do not drive or use machinery until your doctor says that it is safe.  Get help right away if you have a lot of blood in your poop, feel like you may vomit, have a fever, or have more belly pain. This information is not intended to replace advice  given to you by your health care provider. Make sure you discuss any questions you have with your health care provider. Document Revised: 02/20/2019 Document Reviewed: 11/10/2018 Elsevier Patient Education  [place].     Colon Polyps  Colon polyps are tissue growths inside the colon, which is part of the large intestine. They are one of the types of polyps that can grow in the body. A polyp may be a round bump or a mushroom-shaped growth. You could have one polyp or more than one. Most colon polyps are noncancerous (benign). However, some colon polyps can become cancerous over time. Finding and removing the polyps early can help prevent this. What are the causes? The exact cause of  colon polyps is not known. What increases the risk? The following factors may make you more likely to develop this condition:  Having a family history of colorectal cancer or colon polyps.  Being older than 56 years of age.  Being younger than 56 years of age and having a significant family history of colorectal cancer or colon polyps or a genetic condition that puts you at higher risk of getting colon polyps.  Having inflammatory bowel disease, such as ulcerative colitis or Crohn's disease.  Having certain conditions passed from parent to child (hereditary conditions), such as: ? Familial adenomatous polyposis (FAP). ? Lynch syndrome. ? Turcot syndrome. ? Peutz-Jeghers syndrome. ? MUTYH-associated polyposis (MAP).  Being overweight.  Certain lifestyle factors. These include smoking cigarettes, drinking too much alcohol, not getting enough exercise, and eating a diet that is high in fat and red meat and low in fiber.  Having had childhood cancer that was treated with radiation of the abdomen. What are the signs or symptoms? Many times, there are no symptoms. If you have symptoms, they may include:  Blood coming from the rectum during a bowel movement.  Blood in the stool (feces). The blood may be bright red or very dark in color.  Pain in the abdomen.  A change in bowel habits, such as constipation or diarrhea. How is this diagnosed? This condition is diagnosed with a colonoscopy. This is a procedure in which a lighted, flexible scope is inserted into the opening between the buttocks (anus) and then passed into the colon to examine the area. Polyps are sometimes found when a colonoscopy is done as part of routine cancer screening tests. How is this treated? This condition is treated by removing any polyps that are found. Most polyps can be removed during a colonoscopy. Those polyps will then be tested for cancer. Additional treatment may be needed depending on the results of  testing. Follow these instructions at home: Eating and drinking  Eat foods that are high in fiber, such as fruits, vegetables, and whole grains.  Eat foods that are high in calcium and vitamin D, such as milk, cheese, yogurt, eggs, liver, fish, and broccoli.  Limit foods that are high in fat, such as fried foods and desserts.  Limit the amount of red meat, precooked or cured meat, or other processed meat that you eat, such as hot dogs, sausages, bacon, or meat loaves.  Limit sugary drinks.   Lifestyle  Maintain a healthy weight, or lose weight if recommended by your health care provider.  Exercise every day or as told by your health care provider.  Do not use any products that contain nicotine or tobacco, such as cigarettes, e-cigarettes, and chewing tobacco. If you need help quitting, ask your health care provider.  Do not  drink alcohol if: ? Your health care provider tells you not to drink. ? You are pregnant, may be pregnant, or are planning to become pregnant.  If you drink alcohol: ? Limit how much you use to:  0-1 drink a day for women.  0-2 drinks a day for men. ? Know how much alcohol is in your drink. In the [place] one drink equals one 12 oz bottle of beer (355 mL), one 5 oz glass of wine (148 mL), or one 1 oz glass of hard liquor (44 mL). General instructions  Take over-the-counter and prescription medicines only as told by your health care provider.  Keep all follow-up visits. This is important. This includes having regularly scheduled colonoscopies. Talk to your health care provider about when you need a colonoscopy. Contact a health care provider if:  You have new or worsening bleeding during a bowel movement.  You have new or increased blood in your stool.  You have a change in bowel habits.  You lose weight for no known reason. Summary  Colon polyps are tissue growths inside the colon, which is part of the large intestine. They are one type of polyp that  can grow in the body.  Most colon polyps are noncancerous (benign), but some can become cancerous over time.  This condition is diagnosed with a colonoscopy.  This condition is treated by removing any polyps that are found. Most polyps can be removed during a colonoscopy. This information is not intended to replace advice given to you by your health care provider. Make sure you discuss any questions you have with your health care provider. Document Revised: 08/05/2019 Document Reviewed: 08/05/2019 Elsevier Patient Education  2021 [employer].

## 2020-08-04 NOTE — H&P (Signed)
Veronica Dudley is an 54 y.o. female.   Chief Complaint: Patient is here for colonoscopy. HPI: Patient is 55 year old African-American female who is here for high rescreening colonoscopy.  She denies rectal bleeding or abdominal pain.  She has chronic constipation which she tries to control by eating fiber rich foods.  She also takes stool softener on as-needed basis. Last colonoscopy was normal in January 2017. Personal history significant for breast carcinoma and she remains in remission.  She had lumpectomy radiation therapy in 2010. Family history is positive for colon carcinoma and maternal aunt who died at 72 and she also lost her first cousin at 39 because of colon carcinoma.  No information available regarding genetic testing.  Past Medical History:  Diagnosis Date  . Breast CA ([place]) 09/07/2011  . Breast cancer ([place]) 6/10   DCIS  . Cancer ([place])    lt breast ca 2010/surg-lumpectomy/rad tx  . Endometriosis   . H/O bilateral oophorectomy 03/19/2014  . Iron deficiency anemia   . Iron deficiency anemia 09/07/2011  . PONV (postoperative nausea and vomiting)     Past Surgical History:  Procedure Laterality Date  . ABDOMINAL HYSTERECTOMY  2010  . BILATERAL OOPHORECTOMY Bilateral 2010  . CESAREAN SECTION    . CHOLECYSTECTOMY  2005   lap choli  . COLONOSCOPY N/A 05/12/2015   Procedure: COLONOSCOPY;  Surgeon: Rogene Houston, MD;  Location: [facility];  Service: Endoscopy;  Laterality: N/A;  955 - moved to 1/12 @ 9:30  . DIAGNOSTIC LAPAROSCOPY  2004   incisional mass  . LAPAROSCOPY  2006   LOA  . LIPOSUCTION WITH LIPOFILLING Left 05/13/2014   Procedure: LIPOSUCTION WITH LIPOFILLING FOR SYMMETRY;  Surgeon: Theodoro Kos, DO;  Location: [facility];  Service: Plastics;  Laterality: Left;  liposuction from abdomen, lipofilling to left breast  . lumpectomy  2010   left lump-snbx    Family History  Problem Relation Age of Onset  . Heart attack Father   . Cancer  Maternal Aunt        ovarian cancer  . Colon cancer Maternal Aunt    Social History:  reports that she quit smoking about 24 years ago. She has never used smokeless tobacco. She reports current alcohol use of about 2.0 standard drinks of alcohol per week. She reports that she does not use drugs.  Allergies:  Allergies  Allergen Reactions  . Cortisone Diarrhea and Other (See Comments)    Injection form:  "caused my chest to get tight; gave diarrhea really bad"     Medications Prior to Admission  Medication Sig Dispense Refill  . ALPRAZolam (XANAX) 0.5 MG tablet Take 0.5 mg by mouth daily as needed for anxiety.    . Multiple Vitamins-Minerals (WOMENS MULTIVITAMIN PO) Take 1 tablet by mouth daily.    . polyethylene glycol-electrolytes (TRILYTE) 420 g solution Take 4,000 mLs by mouth as directed. 4000 mL 0    No results found for this or any previous visit (from the past 48 hour(s)). No results found.  Review of Systems  Blood pressure (!) 168/78, temperature 98.1 F (36.7 C), temperature source Oral, resp. rate 19, height 5\' 8"  (1.727 m), weight 69.9 kg, SpO2 99 %. Physical Exam HENT:     Mouth/Throat:     Mouth: Mucous membranes are moist.     Pharynx: Oropharynx is clear.  Eyes:     General: No scleral icterus.    Conjunctiva/sclera: Conjunctivae normal.  Cardiovascular:     Rate  and Rhythm: Normal rate and regular rhythm.     Heart sounds: Normal heart sounds. No murmur heard.   Pulmonary:     Effort: Pulmonary effort is normal.     Breath sounds: Normal breath sounds.  Abdominal:     Comments: Abdomen is symmetrical.  Pfannenstiel scar.  Abdomen is soft and nontender with organomegaly or masses.  Musculoskeletal:        General: No swelling.     Cervical back: Neck supple.  Lymphadenopathy:     Cervical: No cervical adenopathy.  Skin:    General: Skin is warm and dry.  Neurological:     Mental Status: She is alert.      Assessment/Plan  High rescreening  colonoscopy. Family history of CRC in 2 second-degree relatives.  Hildred Laser, MD 08/04/2020, 10:30 AM

## 2020-08-05 LAB — SURGICAL PATHOLOGY

## 2020-08-08 ENCOUNTER — Encounter (INDEPENDENT_AMBULATORY_CARE_PROVIDER_SITE_OTHER): Payer: Self-pay | Admitting: *Deleted

## 2020-08-10 ENCOUNTER — Encounter (HOSPITAL_COMMUNITY): Payer: Self-pay | Admitting: Internal Medicine

## 2020-08-24 DIAGNOSIS — Z1331 Encounter for screening for depression: Secondary | ICD-10-CM | POA: Diagnosis not present

## 2020-08-24 DIAGNOSIS — E7849 Other hyperlipidemia: Secondary | ICD-10-CM | POA: Diagnosis not present

## 2020-08-24 DIAGNOSIS — Z6824 Body mass index (BMI) 24.0-24.9, adult: Secondary | ICD-10-CM | POA: Diagnosis not present

## 2020-08-24 DIAGNOSIS — F419 Anxiety disorder, unspecified: Secondary | ICD-10-CM | POA: Diagnosis not present

## 2020-08-24 DIAGNOSIS — Z1389 Encounter for screening for other disorder: Secondary | ICD-10-CM | POA: Diagnosis not present

## 2020-08-24 DIAGNOSIS — D539 Nutritional anemia, unspecified: Secondary | ICD-10-CM | POA: Diagnosis not present

## 2020-08-24 DIAGNOSIS — Z853 Personal history of malignant neoplasm of breast: Secondary | ICD-10-CM | POA: Diagnosis not present

## 2020-08-24 DIAGNOSIS — F418 Other specified anxiety disorders: Secondary | ICD-10-CM | POA: Diagnosis not present

## 2020-08-24 DIAGNOSIS — M779 Enthesopathy, unspecified: Secondary | ICD-10-CM | POA: Diagnosis not present

## 2020-08-24 DIAGNOSIS — Z0000 Encounter for general adult medical examination without abnormal findings: Secondary | ICD-10-CM | POA: Diagnosis not present

## 2020-09-19 DIAGNOSIS — J329 Chronic sinusitis, unspecified: Secondary | ICD-10-CM | POA: Diagnosis not present

## 2020-09-19 DIAGNOSIS — J069 Acute upper respiratory infection, unspecified: Secondary | ICD-10-CM | POA: Diagnosis not present

## 2020-09-21 DIAGNOSIS — R091 Pleurisy: Secondary | ICD-10-CM | POA: Diagnosis not present

## 2020-09-21 DIAGNOSIS — J329 Chronic sinusitis, unspecified: Secondary | ICD-10-CM | POA: Diagnosis not present

## 2020-09-21 DIAGNOSIS — U071 COVID-19: Secondary | ICD-10-CM | POA: Diagnosis not present

## 2020-10-12 DIAGNOSIS — Z6823 Body mass index (BMI) 23.0-23.9, adult: Secondary | ICD-10-CM | POA: Diagnosis not present

## 2020-10-12 DIAGNOSIS — L209 Atopic dermatitis, unspecified: Secondary | ICD-10-CM | POA: Diagnosis not present

## 2020-10-12 DIAGNOSIS — L509 Urticaria, unspecified: Secondary | ICD-10-CM | POA: Diagnosis not present

## 2020-11-23 ENCOUNTER — Other Ambulatory Visit (HOSPITAL_COMMUNITY): Payer: Self-pay | Admitting: Hematology

## 2020-11-23 DIAGNOSIS — Z1231 Encounter for screening mammogram for malignant neoplasm of breast: Secondary | ICD-10-CM

## 2020-11-30 ENCOUNTER — Other Ambulatory Visit: Payer: Self-pay

## 2020-11-30 ENCOUNTER — Ambulatory Visit (HOSPITAL_COMMUNITY)
Admission: RE | Admit: 2020-11-30 | Discharge: 2020-11-30 | Disposition: A | Discharge status: Home / Self Care | Payer: BC Managed Care – PPO | Source: Ambulatory Visit | Attending: Nurse Practitioner | Admitting: Nurse Practitioner

## 2020-11-30 ENCOUNTER — Inpatient Hospital Stay (HOSPITAL_COMMUNITY): Payer: BC Managed Care – PPO | Attending: Hematology

## 2020-11-30 DIAGNOSIS — Z1231 Encounter for screening mammogram for malignant neoplasm of breast: Secondary | ICD-10-CM | POA: Insufficient documentation

## 2020-12-05 ENCOUNTER — Other Ambulatory Visit (HOSPITAL_COMMUNITY): Payer: Self-pay | Admitting: Hematology

## 2020-12-05 DIAGNOSIS — R928 Other abnormal and inconclusive findings on diagnostic imaging of breast: Secondary | ICD-10-CM

## 2020-12-07 ENCOUNTER — Telehealth (HOSPITAL_COMMUNITY): Payer: BC Managed Care – PPO | Admitting: Oncology

## 2020-12-07 ENCOUNTER — Encounter (HOSPITAL_COMMUNITY): Payer: Self-pay

## 2020-12-22 ENCOUNTER — Ambulatory Visit (HOSPITAL_COMMUNITY)
Admission: RE | Admit: 2020-12-22 | Discharge: 2020-12-22 | Disposition: A | Discharge status: Home / Self Care | Payer: BC Managed Care – PPO | Source: Ambulatory Visit | Attending: Hematology | Admitting: Hematology

## 2020-12-22 ENCOUNTER — Other Ambulatory Visit: Payer: Self-pay

## 2020-12-22 ENCOUNTER — Other Ambulatory Visit (HOSPITAL_COMMUNITY): Payer: Self-pay | Admitting: Hematology

## 2020-12-22 DIAGNOSIS — R928 Other abnormal and inconclusive findings on diagnostic imaging of breast: Secondary | ICD-10-CM

## 2020-12-22 DIAGNOSIS — R922 Inconclusive mammogram: Secondary | ICD-10-CM | POA: Diagnosis not present

## 2020-12-22 DIAGNOSIS — Z853 Personal history of malignant neoplasm of breast: Secondary | ICD-10-CM | POA: Diagnosis not present

## 2020-12-22 DIAGNOSIS — N6311 Unspecified lump in the right breast, upper outer quadrant: Secondary | ICD-10-CM | POA: Diagnosis not present

## 2020-12-26 ENCOUNTER — Other Ambulatory Visit (HOSPITAL_COMMUNITY): Payer: Self-pay | Admitting: Surgery

## 2020-12-26 DIAGNOSIS — D0512 Intraductal carcinoma in situ of left breast: Secondary | ICD-10-CM

## 2020-12-26 DIAGNOSIS — D500 Iron deficiency anemia secondary to blood loss (chronic): Secondary | ICD-10-CM

## 2020-12-28 ENCOUNTER — Other Ambulatory Visit: Payer: Self-pay

## 2020-12-28 ENCOUNTER — Inpatient Hospital Stay (HOSPITAL_COMMUNITY): Payer: BC Managed Care – PPO | Attending: Hematology

## 2020-12-28 DIAGNOSIS — D500 Iron deficiency anemia secondary to blood loss (chronic): Secondary | ICD-10-CM

## 2020-12-28 DIAGNOSIS — D509 Iron deficiency anemia, unspecified: Secondary | ICD-10-CM | POA: Insufficient documentation

## 2020-12-28 DIAGNOSIS — Z853 Personal history of malignant neoplasm of breast: Secondary | ICD-10-CM | POA: Diagnosis not present

## 2020-12-28 DIAGNOSIS — D0512 Intraductal carcinoma in situ of left breast: Secondary | ICD-10-CM

## 2020-12-28 LAB — CBC WITH DIFFERENTIAL/PLATELET
Abs Immature Granulocytes: 0.01 10*3/uL (ref 0.00–0.07)
Basophils Absolute: 0 10*3/uL (ref 0.0–0.1)
Basophils Relative: 1 %
Eosinophils Absolute: 0.1 10*3/uL (ref 0.0–0.5)
Eosinophils Relative: 3 %
HCT: 36.3 % (ref 36.0–46.0)
Hemoglobin: 11.3 g/dL — ABNORMAL LOW (ref 12.0–15.0)
Immature Granulocytes: 0 %
Lymphocytes Relative: 37 %
Lymphs Abs: 1.3 10*3/uL (ref 0.7–4.0)
MCH: 28.2 pg (ref 26.0–34.0)
MCHC: 31.1 g/dL (ref 30.0–36.0)
MCV: 90.5 fL (ref 80.0–100.0)
Monocytes Absolute: 0.2 10*3/uL (ref 0.1–1.0)
Monocytes Relative: 7 %
Neutro Abs: 1.9 10*3/uL (ref 1.7–7.7)
Neutrophils Relative %: 52 %
Platelets: 220 10*3/uL (ref 150–400)
RBC: 4.01 MIL/uL (ref 3.87–5.11)
RDW: 12.7 % (ref 11.5–15.5)
WBC: 3.5 10*3/uL — ABNORMAL LOW (ref 4.0–10.5)
nRBC: 0 % (ref 0.0–0.2)

## 2020-12-28 LAB — FERRITIN: Ferritin: 46 ng/mL (ref 11–307)

## 2020-12-28 LAB — LACTATE DEHYDROGENASE: LDH: 130 U/L (ref 98–192)

## 2020-12-28 LAB — COMPREHENSIVE METABOLIC PANEL
ALT: 26 U/L (ref 0–44)
AST: 30 U/L (ref 15–41)
Albumin: 4.1 g/dL (ref 3.5–5.0)
Alkaline Phosphatase: 57 U/L (ref 38–126)
Anion gap: 7 (ref 5–15)
BUN: 8 mg/dL (ref 6–20)
CO2: 28 mmol/L (ref 22–32)
Calcium: 9.2 mg/dL (ref 8.9–10.3)
Chloride: 105 mmol/L (ref 98–111)
Creatinine, Ser: 0.62 mg/dL (ref 0.44–1.00)
GFR, Estimated: >60 mL/min (ref >60)
Glucose, Bld: 91 mg/dL (ref 70–99)
Potassium: 3.8 mmol/L (ref 3.5–5.1)
Sodium: 140 mmol/L (ref 135–145)
Total Bilirubin: 0.6 mg/dL (ref 0.3–1.2)
Total Protein: 7.3 g/dL (ref 6.5–8.1)

## 2020-12-28 LAB — IRON AND TIBC
Iron: 98 ug/dL (ref 28–170)
Saturation Ratios: 27 % (ref 10.4–31.8)
TIBC: 356 ug/dL (ref 250–450)
UIBC: 258 ug/dL

## 2020-12-28 LAB — VITAMIN D 25 HYDROXY (VIT D DEFICIENCY, FRACTURES): Vit D, 25-Hydroxy: 31.36 ng/mL (ref 30–100)

## 2020-12-28 LAB — FOLATE: Folate: 21.3 ng/mL (ref >5.9)

## 2020-12-28 LAB — VITAMIN B12: Vitamin B-12: 416 pg/mL (ref 180–914)

## 2021-01-03 ENCOUNTER — Other Ambulatory Visit: Payer: Self-pay

## 2021-01-03 ENCOUNTER — Other Ambulatory Visit (HOSPITAL_COMMUNITY): Payer: Self-pay | Admitting: Family Medicine

## 2021-01-03 ENCOUNTER — Ambulatory Visit (HOSPITAL_COMMUNITY)
Admission: RE | Admit: 2021-01-03 | Discharge: 2021-01-03 | Disposition: A | Discharge status: Home / Self Care | Payer: BC Managed Care – PPO | Source: Ambulatory Visit | Attending: Family Medicine | Admitting: Family Medicine

## 2021-01-03 ENCOUNTER — Encounter (HOSPITAL_COMMUNITY): Payer: Self-pay

## 2021-01-03 ENCOUNTER — Ambulatory Visit (HOSPITAL_COMMUNITY)
Admission: RE | Admit: 2021-01-03 | Discharge: 2021-01-03 | Disposition: A | Discharge status: Home / Self Care | Payer: BC Managed Care – PPO | Source: Ambulatory Visit | Attending: Hematology | Admitting: Hematology

## 2021-01-03 DIAGNOSIS — R928 Other abnormal and inconclusive findings on diagnostic imaging of breast: Secondary | ICD-10-CM

## 2021-01-03 DIAGNOSIS — N6311 Unspecified lump in the right breast, upper outer quadrant: Secondary | ICD-10-CM | POA: Diagnosis not present

## 2021-01-03 DIAGNOSIS — C50811 Malignant neoplasm of overlapping sites of right female breast: Secondary | ICD-10-CM | POA: Diagnosis not present

## 2021-01-03 MED ORDER — SODIUM BICARBONATE 4.2 % IV SOLN
INTRAVENOUS | Status: AC
  Administered 2021-01-03: 2.5 meq
  Filled 2021-01-03: qty 10

## 2021-01-03 MED ORDER — LIDOCAINE HCL (PF) 2 % IJ SOLN
INTRAMUSCULAR | Status: AC
  Administered 2021-01-03: 10 mL
  Filled 2021-01-03: qty 10

## 2021-01-03 MED ORDER — LIDOCAINE-EPINEPHRINE (PF) 2 %-1:200000 IJ SOLN
INTRAMUSCULAR | Status: AC
  Administered 2021-01-03: 10 mL
  Filled 2021-01-03: qty 10

## 2021-01-03 NOTE — Sedation Documentation (Signed)
PT tolerated right breast biopsy well today with NAD noted. PT verbalized understanding of discharge instructions. PT ambulated back to the mammogram area this time. 

## 2021-01-04 ENCOUNTER — Encounter (HOSPITAL_COMMUNITY): Payer: Self-pay

## 2021-01-04 ENCOUNTER — Telehealth (HOSPITAL_COMMUNITY): Payer: BC Managed Care – PPO | Admitting: Physician Assistant

## 2021-01-06 ENCOUNTER — Ambulatory Visit: Payer: Self-pay | Admitting: General Surgery

## 2021-01-06 DIAGNOSIS — C50411 Malignant neoplasm of upper-outer quadrant of right female breast: Secondary | ICD-10-CM

## 2021-01-06 DIAGNOSIS — Z170 Estrogen receptor positive status [ER+]: Secondary | ICD-10-CM | POA: Diagnosis not present

## 2021-01-10 ENCOUNTER — Other Ambulatory Visit: Payer: Self-pay | Admitting: General Surgery

## 2021-01-10 ENCOUNTER — Other Ambulatory Visit: Payer: Self-pay

## 2021-01-10 ENCOUNTER — Encounter: Payer: Self-pay | Admitting: Radiation Oncology

## 2021-01-10 ENCOUNTER — Ambulatory Visit
Admission: RE | Admit: 2021-01-10 | Discharge: 2021-01-10 | Disposition: A | Discharge status: Home / Self Care | Payer: BC Managed Care – PPO | Source: Ambulatory Visit | Attending: Radiation Oncology | Admitting: Radiation Oncology

## 2021-01-10 VITALS — Ht 68.0 in | Wt 156.0 lb

## 2021-01-10 DIAGNOSIS — Z170 Estrogen receptor positive status [ER+]: Secondary | ICD-10-CM

## 2021-01-10 DIAGNOSIS — C50811 Malignant neoplasm of overlapping sites of right female breast: Secondary | ICD-10-CM | POA: Insufficient documentation

## 2021-01-10 DIAGNOSIS — D0512 Intraductal carcinoma in situ of left breast: Secondary | ICD-10-CM | POA: Diagnosis not present

## 2021-01-10 NOTE — Progress Notes (Signed)
Radiation Oncology         [phone] ________________________________  Name: Veronica Dudley        MRN: [id number]  Date of Service: 01/10/2021 DOB: [birth year]  [id number], Veronica Reichmann, MD  Jovita Kussmaul, MD     REFERRING PHYSICIAN: Autumn Messing III, MD   DIAGNOSIS: The primary encounter diagnosis was Ductal carcinoma in situ (DCIS) of left breast. A diagnosis of Malignant neoplasm of overlapping sites of right breast in female, estrogen receptor positive ([place]) was also pertinent to this visit.   HISTORY OF PRESENT ILLNESS: Veronica Dudley is a 55 y.o. female seen for a diagnosis of right breast cancer. The patient also has a history of Intermediate grade, ER positive DCIS of the left breast treated in 2010 with lumpectomy and adjuvant radiotherapy. She declined antiestrogen therapy. She was planning on reconstruction but did not proceed with this to correct assymmetry. She was counseled on genetic testing but also did not proceed. She has been followed by medical oncology in [place] and a mammogram in July 2020 showed possible left sided mass, and no disease in the right side. Repeat mammogram in July 2021 was negative.   She proceeded with mammogram on 11/30/20 that showed possible mass in the right breast and no findings in the left breast. Diagnostic imaging on 12/22/20 showed a suspicious mass in the 9:00 position of the right breast and no evidence of adenopathy.The mass measured 8 mm. A biopsy on 01/03/21 identified a grade 1-2 invasive ductal carcinoma that was ER positive, PR negative HER2 was equivocal by IHC and FISH is pending. Ki 67 was 1%. She's met with Dr. Marlou Starks who offered lumpectomy with sentinel node biopsy. She's planning to meet with Dr. Delton Coombes also this week.    PREVIOUS RADIATION THERAPY: Yes   2010: The patient received adjuvant radiotherapy to the breast in [place] over about 6 weeks with Dr. Tammi Klippel.   PAST MEDICAL HISTORY:  Past Medical History:  Diagnosis Date    Breast CA ([place]) 09/07/2011   Breast cancer ([place]) 6/10   DCIS   Cancer ([place])    lt breast ca 2010/surg-lumpectomy/rad tx   Endometriosis    H/O bilateral oophorectomy 03/19/2014   Iron deficiency anemia    Iron deficiency anemia 09/07/2011   PONV (postoperative nausea and vomiting)        PAST SURGICAL HISTORY: Past Surgical History:  Procedure Laterality Date   ABDOMINAL HYSTERECTOMY  2010   BILATERAL OOPHORECTOMY Bilateral 2010   CESAREAN SECTION     CHOLECYSTECTOMY  2005   lap choli   COLONOSCOPY N/A 05/12/2015   Procedure: COLONOSCOPY;  Surgeon: Rogene Houston, MD;  [facility];  Service: Endoscopy;  Laterality: N/A;  955 - moved to 1/12 @ 9:30   COLONOSCOPY N/A 08/04/2020   Procedure: COLONOSCOPY;  Surgeon: Rogene Houston, MD;  Location[facility];  Service: Endoscopy;  Laterality: N/A;  am   DIAGNOSTIC LAPAROSCOPY  2004   incisional mass   LAPAROSCOPY  2006   LOA   LIPOSUCTION WITH LIPOFILLING Left 05/13/2014   Procedure: LIPOSUCTION WITH LIPOFILLING FOR SYMMETRY;  Surgeon: Theodoro Kos, DO;  Location: [facility];  Service: Plastics;  Laterality: Left;  liposuction from abdomen, lipofilling to left breast   lumpectomy  2010   left lump-snbx   POLYPECTOMY  08/04/2020   Procedure: POLYPECTOMY;  Surgeon: Rogene Houston, MD;  [facility];  Service: Endoscopy;;     FAMILY HISTORY:  Family History  Problem Relation Age of Onset   Heart attack Father    Cancer Maternal Aunt        ovarian cancer   Colon cancer Maternal Aunt      SOCIAL HISTORY:  reports that she quit smoking about 24 years ago. Her smoking use included cigarettes. She has never used smokeless tobacco. She reports current alcohol use of about 2.0 standard drinks per week. She reports that she does not use drugs. The patient is married and has two sons. 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    ALLERGIES: Cortisone   MEDICATIONS:  Current  Outpatient Medications  Medication Sig Dispense Refill   ALPRAZolam (XANAX) 0.5 MG tablet Take 0.5 mg by mouth daily as needed for anxiety.     Multiple Vitamins-Minerals (WOMENS MULTIVITAMIN PO) Take 1 tablet by mouth daily.     No current facility-administered medications for this encounter.     REVIEW OF SYSTEMS: On review of systems, the patient reports that she is doing well overall. She has a lot going on right now as her husband is having major spine surgery in the near future, a busy working mother and wife. No other complaints are verbalized.     PHYSICAL EXAM:  Wt Readings from Last 3 Encounters:  08/04/20 154 lb (69.9 kg)  05/25/19 157 lb 3.2 oz (71.3 kg)  11/24/18 160 lb (72.6 kg)   Temp Readings from Last 3 Encounters:  01/03/21 98.2 F (36.8 C) (Oral)  08/04/20 97.8 F (36.6 C) (Oral)  05/26/19 (!) 96.8 F (36 C)   BP Readings from Last 3 Encounters:  01/03/21 136/77  08/04/20 120/80  05/25/19 131/80   Pulse Readings from Last 3 Encounters:  01/03/21 92  08/04/20 91  05/25/19 87    In general this is a well appearing African American female in no acute distress. She's alert and oriented x4 and appropriate throughout the examination. Cardiopulmonary assessment is negative for acute distress and she exhibits normal effort. Bilateral breast exam is deferred.    ECOG = 0  0 - Asymptomatic (Fully active, able to carry on all predisease activities without restriction)  1 - Symptomatic but completely ambulatory (Restricted in physically strenuous activity but ambulatory and able to carry out work of a light or sedentary nature. For example, light housework, office work)  2 - Symptomatic, <50% in bed during the day (Ambulatory and capable of all self care but unable to carry out any work activities. Up and about more than 50% of waking hours)  3 - Symptomatic, >50% in bed, but not bedbound (Capable of only limited self-care, confined to bed or chair 50% or more  of waking hours)  4 - Bedbound (Completely disabled. Cannot carry on any self-care. Totally confined to bed or chair)  5 - Death   Veronica Dudley MM, Veronica Dudley, Veronica Dudley, et al. [phone]). "Toxicity and response criteria of the [facility]". [place] Oncol. 5 (6): 649-55    LABORATORY DATA:  Lab Results  Component Value Date   WBC 3.5 (L) 12/28/2020   HGB 11.3 (L) 12/28/2020   HCT 36.3 12/28/2020   MCV 90.5 12/28/2020   PLT 220 12/28/2020   Lab Results  Component Value Date   NA 140 12/28/2020   K 3.8 12/28/2020   CL 105 12/28/2020   CO2 28 12/28/2020   Lab Results  Component Value Date   ALT 26 12/28/2020   AST 30  12/28/2020   ALKPHOS 57 12/28/2020   BILITOT 0.6 12/28/2020      RADIOGRAPHY: US BREAST LTD UNI RIGHT INC AXILLA  Result Date: 12/22/2020 CLINICAL DATA:  History of LEFT breast cancer in 2007. Patient returns after screening study for evaluation of possible RIGHT breast mass. EXAM: DIGITAL DIAGNOSTIC UNILATERAL RIGHT MAMMOGRAM WITH TOMOSYNTHESIS AND CAD; ULTRASOUND RIGHT BREAST LIMITED TECHNIQUE: Right digital diagnostic mammography and breast tomosynthesis was performed. The images were evaluated with computer-aided detection.; Targeted ultrasound examination of the right breast was performed COMPARISON:  Previous exam(s). ACR Breast Density Category c: The breast tissue is heterogeneously dense, which may obscure small masses. FINDINGS: Additional 2-D and 3-D images are performed. These views confirm presence of a spiculated mass in the UPPER-OUTER QUADRANT of the RIGHT breast. On physical exam, I palpate no mass in the UPPER-OUTER QUADRANT of the RIGHT breast. Targeted ultrasound is performed, showing an oval mass with indistinct margins in the 9 o'clock location of the RIGHT breast 12 centimeters from the nipple. Mass is taller than wide and measures 0.6 x 0.6 x 0.8 centimeters. Adjacent or internal blood flow identified on Doppler evaluation.  Evaluation of the RIGHT axilla is negative for adenopathy. IMPRESSION: Suspicious mass in the 9 o'clock location of the RIGHT breast for which biopsy is recommended. No RIGHT axillary adenopathy. RECOMMENDATION: Recommend ultrasound-guided core biopsy of RIGHT breast mass. I have discussed the findings and recommendations with the patient. If applicable, a reminder letter will be sent to the patient regarding the next appointment. BI-RADS CATEGORY  4: Suspicious. Electronically Signed   By: Nolon Nations M.D.   On: 12/22/2020 11:24  MM DIAG BREAST TOMO UNI RIGHT  Result Date: 12/22/2020 CLINICAL DATA:  History of LEFT breast cancer in 2007. Patient returns after screening study for evaluation of possible RIGHT breast mass. EXAM: DIGITAL DIAGNOSTIC UNILATERAL RIGHT MAMMOGRAM WITH TOMOSYNTHESIS AND CAD; ULTRASOUND RIGHT BREAST LIMITED TECHNIQUE: Right digital diagnostic mammography and breast tomosynthesis was performed. The images were evaluated with computer-aided detection.; Targeted ultrasound examination of the right breast was performed COMPARISON:  Previous exam(s). ACR Breast Density Category c: The breast tissue is heterogeneously dense, which may obscure small masses. FINDINGS: Additional 2-D and 3-D images are performed. These views confirm presence of a spiculated mass in the UPPER-OUTER QUADRANT of the RIGHT breast. On physical exam, I palpate no mass in the UPPER-OUTER QUADRANT of the RIGHT breast. Targeted ultrasound is performed, showing an oval mass with indistinct margins in the 9 o'clock location of the RIGHT breast 12 centimeters from the nipple. Mass is taller than wide and measures 0.6 x 0.6 x 0.8 centimeters. Adjacent or internal blood flow identified on Doppler evaluation. Evaluation of the RIGHT axilla is negative for adenopathy. IMPRESSION: Suspicious mass in the 9 o'clock location of the RIGHT breast for which biopsy is recommended. No RIGHT axillary adenopathy. RECOMMENDATION:  Recommend ultrasound-guided core biopsy of RIGHT breast mass. I have discussed the findings and recommendations with the patient. If applicable, a reminder letter will be sent to the patient regarding the next appointment. BI-RADS CATEGORY  4: Suspicious. Electronically Signed   By: Nolon Nations M.D.   On: 12/22/2020 11:24  MM CLIP PLACEMENT RIGHT  Result Date: 01/03/2021 CLINICAL DATA:  Post ultrasound-guided biopsy of a suspicious mass in the right breast at the 9 o'clock position. EXAM: 3D DIAGNOSTIC RIGHT MAMMOGRAM POST ULTRASOUND BIOPSY COMPARISON:  Previous exams. FINDINGS: 3D Mammographic images were obtained following ultrasound guided biopsy of a mass in the right breast  at the 9 o'clock position. A ribbon shaped biopsy marking clip is present at the site of the biopsied mass in the right breast at the 9 o'clock position. IMPRESSION: Ribbon shaped biopsy marking clip at site of biopsied mass in the right breast at the 9 o'clock position. Final Assessment: Post Procedure Mammograms for Marker Placement Electronically Signed   By: Everlean Alstrom M.D.   On: 01/03/2021 08:41  [place] RT BREAST BX W LOC DEV 1ST LESION IMG BX SPEC US GUIDE  Addendum Date: 01/05/2021   ADDENDUM REPORT: 01/05/2021 13:40 ADDENDUM: PATHOLOGY revealed: A. BREAST, 9:00, MASS, RIGHT, BIOPSY: - Invasive ductal carcinoma, grade 1/2. COMMENT: The greatest tumor dimension is 0.6 cm. Breast prognostic profile will be performed. Pathology results are CONCORDANT with imaging findings, per Dr. Everlean Alstrom. Pathology results and recommendations were discussed with patient via telephone on 01/04/2021. Patient reported doing well after the biopsy with no adverse symptoms, and only slight tenderness at the site. Post biopsy care instructions were reviewed, questions were answered and my direct phone number was provided. Patient was instructed to call [facility] Mammography Department for any additional questions or  concerns related to biopsy site. RECOMMENDATION: Surgical consultation. Request for surgical consultation was relayed to Kathi Der RT at [facility] Mammography Department by Electa Sniff RN on 01/04/2021. Pathology results reported by Electa Sniff RN on 01/05/2021. Electronically Signed   By: Everlean Alstrom M.D.   On: 01/05/2021 13:40   Result Date: 01/05/2021 CLINICAL DATA:  55 year old female with a suspicious 0.6 cm mass in the right breast at 9 o'clock 12 cm from nipple. EXAM: ULTRASOUND GUIDED RIGHT BREAST CORE NEEDLE BIOPSY COMPARISON:  Previous exam(s). PROCEDURE: I met with the patient and we discussed the procedure of ultrasound-guided biopsy, including benefits and alternatives. We discussed the high likelihood of a successful procedure. We discussed the risks of the procedure, including infection, bleeding, tissue injury, clip migration, and inadequate sampling. Informed written consent was given. The usual time-out protocol was performed immediately prior to the procedure. Lesion quadrant: Upper-outer Using sterile technique and 1% Lidocaine as local anesthetic, under direct ultrasound visualization, a 14 gauge spring-loaded device was used to perform biopsy of the mass in the right breast at the 9 o'clock position using a lateral to medial approach. At the conclusion of the procedure a ribbon shaped tissue marker clip was deployed into the biopsy cavity. Follow up 2 view mammogram was performed and dictated separately. IMPRESSION: Ultrasound guided biopsy of the mass in the right breast at the 9 o'clock position. No apparent complications. Electronically Signed: By: Everlean Alstrom M.D. On: 01/03/2021 08:33       IMPRESSION/PLAN: 1. Stage IA, [id number], grade 1-2 ER positive invasive ductal carcinoma of the right breast. Dr. Lisbeth Renshaw has reviewed her case. I discussed the pathology findings and reviews the nature of right breast disease. The consensus from the breast conference includes  breast conservation with lumpectomy with sentinel node biopsy. Depending on the size of the final tumor measurements rendered by pathology, the tumor may be tested for Oncotype Dx score to determine a role for systemic therapy. Provided that chemotherapy is not indicated, the patient's course would then be followed by external radiotherapy to the breast  to reduce risks of local recurrence followed by antiestrogen therapy. We discussed the risks, benefits, short, and long term effects of radiotherapy, as well as the curative intent, and the patient is interested in considering her options but if she elected  to proceed with radiation, she would desire this in [place].  We discussed that if she were to proceed we would anticipate a course of 4 or up to 6 1/2 weeks of radiotherapy. We will see her back as needed if she decides she wants treatment in [place], but will copy the team to make sure that if she received radiation, she would likely want this in [place] closer to home. 2. History of ER positive Intermediate grade DCIS of the left breast. She will continue to be followed expectantly in surveillance for this with Dr. Delton Coombes. 3. Possible genetic predisposition to malignancy. The patient is a candidate for genetic testing given her personal history. She was offered referral and would like to review this option after completing surgery.  This encounter was provided by telemedicine platform MyChart.  The patient has provided two factor identification and has given verbal consent for this type of encounter and has been advised to only accept a meeting of this type in a secure network environment. The time spent during this encounter was 60 minutes including preparation, discussion, and coordination of the patient's care. The attendants for this meeting include Blenda Nicely, RN, Hayden Pedro  and Leota Jacobsen.  During the encounter,  Blenda Nicely, RN,  and Hayden Pedro were located at  [facility] Radiation Oncology Department.  Jaydn HARLOW BASLEY was located at home.       Carola Rhine, [facility]    **Disclaimer: This note was dictated with voice recognition software. Similar sounding words can inadvertently be transcribed and this note may contain transcription errors which may not have been corrected upon publication of note.**

## 2021-01-10 NOTE — Progress Notes (Signed)
New Breast Cancer Diagnosis: Right Breast UOQ  Did patient present with symptoms (if so, please note symptoms) or screening mammography?:Screening Mass    Location and Extent of disease :right breast. Located in the upper outer quadrant, measured  8 mm in greatest dimension. Adenopathy no.  Histology per Pathology Report: grade 1/2, Invasive Ductal Carcinoma  Receptor Status: ER(positive), PR (negative), Her2-neu (Pending), Ki-(1%)  Surgeon and surgical plan, if any: Dr. Marlou Starks 01/06/2021 - At this point she favors breast conservation. -She is also a good candidate for sentinel node biopsy. -I will refer her to medica and radiation oncology to discuss adjuvant therapy. -Lumpectomy unscheduled at this time.  Medical oncologist, treatment if any:   -Dr. Delton Coombes 01/12/2021   Family History of Breast/Ovarian/Prostate Cancer: None  Lymphedema issues, if any: none  Pain issues, if any: has some mild tenderness at the site.  SAFETY ISSUES: Prior radiation? Left Breast, 12 years ago, Done in [place] with Dr. Tammi Klippel for 6 weeks. Pacemaker/ICD? No Possible current pregnancy? Postmenopausal Is the patient on methotrexate? no  Current Complaints / other details:   -She is eligible for genetic testing due to first breast cancer diagnosis before the age of 80 and now having a second breast cancer.  -She reports she is still in the processing stage and is unsure where she would like to have radiation treatments.  -Left Lumpectomy with radiation 12 years ago, declined antiestrogens.

## 2021-01-11 ENCOUNTER — Ambulatory Visit (HOSPITAL_COMMUNITY): Payer: BC Managed Care – PPO | Admitting: Physician Assistant

## 2021-01-11 ENCOUNTER — Other Ambulatory Visit: Payer: Self-pay | Admitting: *Deleted

## 2021-01-11 DIAGNOSIS — D0512 Intraductal carcinoma in situ of left breast: Secondary | ICD-10-CM

## 2021-01-12 ENCOUNTER — Ambulatory Visit (HOSPITAL_COMMUNITY): Payer: BC Managed Care – PPO | Admitting: Hematology

## 2021-01-14 LAB — SURGICAL PATHOLOGY

## 2021-01-19 ENCOUNTER — Encounter (HOSPITAL_BASED_OUTPATIENT_CLINIC_OR_DEPARTMENT_OTHER): Payer: Self-pay | Admitting: General Surgery

## 2021-01-23 ENCOUNTER — Ambulatory Visit: Payer: BC Managed Care – PPO | Attending: General Surgery | Admitting: Rehabilitation

## 2021-01-24 ENCOUNTER — Other Ambulatory Visit: Payer: Self-pay | Admitting: General Surgery

## 2021-01-24 ENCOUNTER — Ambulatory Visit
Admission: RE | Admit: 2021-01-24 | Discharge: 2021-01-24 | Disposition: A | Discharge status: Home / Self Care | Payer: BC Managed Care – PPO | Source: Ambulatory Visit | Attending: General Surgery | Admitting: General Surgery

## 2021-01-24 ENCOUNTER — Other Ambulatory Visit: Payer: Self-pay

## 2021-01-24 DIAGNOSIS — C50411 Malignant neoplasm of upper-outer quadrant of right female breast: Secondary | ICD-10-CM

## 2021-01-24 DIAGNOSIS — N6315 Unspecified lump in the right breast, overlapping quadrants: Secondary | ICD-10-CM | POA: Diagnosis not present

## 2021-01-24 DIAGNOSIS — Z170 Estrogen receptor positive status [ER+]: Secondary | ICD-10-CM

## 2021-01-26 ENCOUNTER — Ambulatory Visit (HOSPITAL_COMMUNITY)
Admission: RE | Admit: 2021-01-26 | Discharge: 2021-01-26 | Disposition: A | Discharge status: Home / Self Care | Payer: BC Managed Care – PPO | Source: Ambulatory Visit | Attending: General Surgery | Admitting: General Surgery

## 2021-01-26 ENCOUNTER — Other Ambulatory Visit: Payer: Self-pay

## 2021-01-26 ENCOUNTER — Ambulatory Visit (HOSPITAL_BASED_OUTPATIENT_CLINIC_OR_DEPARTMENT_OTHER): Payer: BC Managed Care – PPO | Admitting: Anesthesiology

## 2021-01-26 ENCOUNTER — Encounter (HOSPITAL_BASED_OUTPATIENT_CLINIC_OR_DEPARTMENT_OTHER): Payer: Self-pay | Admitting: General Surgery

## 2021-01-26 ENCOUNTER — Encounter (HOSPITAL_BASED_OUTPATIENT_CLINIC_OR_DEPARTMENT_OTHER)
Admission: RE | Disposition: A | Discharge status: Home / Self Care | Payer: Self-pay | Source: Home / Self Care | Attending: General Surgery

## 2021-01-26 ENCOUNTER — Ambulatory Visit
Admission: RE | Admit: 2021-01-26 | Discharge: 2021-01-26 | Disposition: A | Discharge status: Home / Self Care | Payer: BC Managed Care – PPO | Source: Ambulatory Visit | Attending: General Surgery | Admitting: General Surgery

## 2021-01-26 ENCOUNTER — Ambulatory Visit (HOSPITAL_BASED_OUTPATIENT_CLINIC_OR_DEPARTMENT_OTHER)
Admission: RE | Admit: 2021-01-26 | Discharge: 2021-01-26 | Disposition: A | Discharge status: Home / Self Care | Payer: BC Managed Care – PPO | Attending: General Surgery | Admitting: General Surgery

## 2021-01-26 DIAGNOSIS — Z888 Allergy status to other drugs, medicaments and biological substances status: Secondary | ICD-10-CM | POA: Insufficient documentation

## 2021-01-26 DIAGNOSIS — C50411 Malignant neoplasm of upper-outer quadrant of right female breast: Secondary | ICD-10-CM

## 2021-01-26 DIAGNOSIS — Z923 Personal history of irradiation: Secondary | ICD-10-CM | POA: Diagnosis not present

## 2021-01-26 DIAGNOSIS — Z170 Estrogen receptor positive status [ER+]: Secondary | ICD-10-CM | POA: Insufficient documentation

## 2021-01-26 DIAGNOSIS — D509 Iron deficiency anemia, unspecified: Secondary | ICD-10-CM | POA: Diagnosis not present

## 2021-01-26 DIAGNOSIS — Z87891 Personal history of nicotine dependence: Secondary | ICD-10-CM | POA: Insufficient documentation

## 2021-01-26 DIAGNOSIS — G8918 Other acute postprocedural pain: Secondary | ICD-10-CM | POA: Diagnosis not present

## 2021-01-26 DIAGNOSIS — C50911 Malignant neoplasm of unspecified site of right female breast: Secondary | ICD-10-CM | POA: Diagnosis not present

## 2021-01-26 DIAGNOSIS — N809 Endometriosis, unspecified: Secondary | ICD-10-CM | POA: Diagnosis not present

## 2021-01-26 DIAGNOSIS — C50811 Malignant neoplasm of overlapping sites of right female breast: Secondary | ICD-10-CM | POA: Diagnosis not present

## 2021-01-26 HISTORY — PX: BREAST LUMPECTOMY WITH RADIOACTIVE SEED AND SENTINEL LYMPH NODE BIOPSY: SHX6550

## 2021-01-26 SURGERY — BREAST LUMPECTOMY WITH RADIOACTIVE SEED AND SENTINEL LYMPH NODE BIOPSY
Anesthesia: General | Site: Breast | Laterality: Right

## 2021-01-26 MED ORDER — ONDANSETRON HCL 4 MG/2ML IJ SOLN
INTRAMUSCULAR | Status: AC
  Filled 2021-01-26: qty 2

## 2021-01-26 MED ORDER — FENTANYL CITRATE (PF) 100 MCG/2ML IJ SOLN
INTRAMUSCULAR | Status: AC
  Filled 2021-01-26: qty 2

## 2021-01-26 MED ORDER — TECHNETIUM TC 99M TILMANOCEPT KIT
1.0000 | PACK | Freq: Once | INTRAVENOUS | Status: AC | PRN
  Administered 2021-01-26: 1 via INTRADERMAL

## 2021-01-26 MED ORDER — GABAPENTIN 300 MG PO CAPS
ORAL_CAPSULE | ORAL | Status: AC
  Filled 2021-01-26: qty 1

## 2021-01-26 MED ORDER — MIDAZOLAM HCL 2 MG/2ML IJ SOLN
INTRAMUSCULAR | Status: AC
  Filled 2021-01-26: qty 2

## 2021-01-26 MED ORDER — CHLORHEXIDINE GLUCONATE CLOTH 2 % EX PADS: 6.0000 | MEDICATED_PAD | Freq: Once | CUTANEOUS | Status: DC

## 2021-01-26 MED ORDER — FENTANYL CITRATE (PF) 100 MCG/2ML IJ SOLN: 25.0000 ug | INTRAMUSCULAR | Status: DC | PRN

## 2021-01-26 MED ORDER — ACETAMINOPHEN 500 MG PO TABS
ORAL_TABLET | ORAL | Status: AC
  Filled 2021-01-26: qty 2

## 2021-01-26 MED ORDER — LIDOCAINE 2% (20 MG/ML) 5 ML SYRINGE
INTRAMUSCULAR | Status: DC | PRN
  Administered 2021-01-26: 60 mg via INTRAVENOUS

## 2021-01-26 MED ORDER — BUPIVACAINE-EPINEPHRINE 0.25% -1:200000 IJ SOLN
INTRAMUSCULAR | Status: DC | PRN
  Administered 2021-01-26: 10 mL

## 2021-01-26 MED ORDER — 0.9 % SODIUM CHLORIDE (POUR BTL) OPTIME
TOPICAL | Status: DC | PRN
  Administered 2021-01-26: 1000 mL

## 2021-01-26 MED ORDER — HYDROCODONE-ACETAMINOPHEN 5-325 MG PO TABS: 1.0000 | ORAL_TABLET | Freq: Four times a day (QID) | ORAL | 0 refills | Status: DC | PRN

## 2021-01-26 MED ORDER — CELECOXIB 200 MG PO CAPS
ORAL_CAPSULE | ORAL | Status: AC
  Filled 2021-01-26: qty 1

## 2021-01-26 MED ORDER — FENTANYL CITRATE (PF) 100 MCG/2ML IJ SOLN
50.0000 ug | Freq: Once | INTRAMUSCULAR | Status: AC
  Administered 2021-01-26: 50 ug via INTRAVENOUS

## 2021-01-26 MED ORDER — GABAPENTIN 300 MG PO CAPS
300.0000 mg | ORAL_CAPSULE | ORAL | Status: AC
  Administered 2021-01-26: 300 mg via ORAL

## 2021-01-26 MED ORDER — FENTANYL CITRATE (PF) 100 MCG/2ML IJ SOLN
INTRAMUSCULAR | Status: DC | PRN
  Administered 2021-01-26 (×2): 25 ug via INTRAVENOUS
  Administered 2021-01-26: 50 ug via INTRAVENOUS

## 2021-01-26 MED ORDER — CEFAZOLIN SODIUM-DEXTROSE 2-4 GM/100ML-% IV SOLN
2.0000 g | INTRAVENOUS | Status: AC
  Administered 2021-01-26: 2 g via INTRAVENOUS

## 2021-01-26 MED ORDER — ACETAMINOPHEN 500 MG PO TABS
1000.0000 mg | ORAL_TABLET | ORAL | Status: AC
  Administered 2021-01-26: 1000 mg via ORAL

## 2021-01-26 MED ORDER — PROPOFOL 10 MG/ML IV BOLUS
INTRAVENOUS | Status: DC | PRN
  Administered 2021-01-26: 200 mg via INTRAVENOUS

## 2021-01-26 MED ORDER — OXYCODONE HCL 5 MG/5ML PO SOLN
5.0000 mg | Freq: Once | ORAL | Status: DC | PRN
Start: 2021-01-26 — End: 2021-01-26

## 2021-01-26 MED ORDER — MAGTRACE LYMPHATIC TRACER
INTRAMUSCULAR | Status: DC | PRN
  Administered 2021-01-26: 2 mL via INTRAMUSCULAR

## 2021-01-26 MED ORDER — LACTATED RINGERS IV SOLN: INTRAVENOUS | Status: DC

## 2021-01-26 MED ORDER — CEFAZOLIN SODIUM-DEXTROSE 2-4 GM/100ML-% IV SOLN
INTRAVENOUS | Status: AC
  Filled 2021-01-26: qty 100

## 2021-01-26 MED ORDER — CELECOXIB 200 MG PO CAPS
200.0000 mg | ORAL_CAPSULE | ORAL | Status: AC
  Administered 2021-01-26: 200 mg via ORAL

## 2021-01-26 MED ORDER — BUPIVACAINE-EPINEPHRINE (PF) 0.5% -1:200000 IJ SOLN
INTRAMUSCULAR | Status: DC | PRN
  Administered 2021-01-26: 25 mL via PERINEURAL

## 2021-01-26 MED ORDER — PROPOFOL 500 MG/50ML IV EMUL
INTRAVENOUS | Status: DC | PRN
  Administered 2021-01-26 (×2): 150 ug/kg/min via INTRAVENOUS

## 2021-01-26 MED ORDER — OXYCODONE HCL 5 MG PO TABS: 5.0000 mg | ORAL_TABLET | Freq: Once | ORAL | Status: DC | PRN

## 2021-01-26 MED ORDER — ONDANSETRON HCL 4 MG/2ML IJ SOLN
INTRAMUSCULAR | Status: DC | PRN
  Administered 2021-01-26: 4 mg via INTRAVENOUS

## 2021-01-26 MED ORDER — ONDANSETRON HCL 4 MG/2ML IJ SOLN
4.0000 mg | Freq: Four times a day (QID) | INTRAMUSCULAR | Status: AC | PRN
Start: 2021-01-26 — End: 2021-01-26
  Administered 2021-01-26: 4 mg via INTRAVENOUS

## 2021-01-26 SURGICAL SUPPLY — 42 items
APPLIER CLIP 9.375 MED OPEN (MISCELLANEOUS) ×2
BLADE SURG 15 STRL LF DISP TIS (BLADE) ×1 IMPLANT
BLADE SURG 15 STRL SS (BLADE) ×2
CANISTER SUC SOCK COL 7IN (MISCELLANEOUS) IMPLANT
CANISTER SUCT 1200ML W/VALVE (MISCELLANEOUS) ×2 IMPLANT
CHLORAPREP W/TINT 26 (MISCELLANEOUS) ×2 IMPLANT
CLIP APPLIE 9.375 MED OPEN (MISCELLANEOUS) ×1 IMPLANT
COVER BACK TABLE 60X90IN (DRAPES) ×2 IMPLANT
COVER MAYO STAND STRL (DRAPES) ×2 IMPLANT
COVER PROBE W GEL 5X96 (DRAPES) ×2 IMPLANT
DECANTER SPIKE VIAL GLASS SM (MISCELLANEOUS) IMPLANT
DERMABOND ADVANCED (GAUZE/BANDAGES/DRESSINGS) ×1
DERMABOND ADVANCED .7 DNX12 (GAUZE/BANDAGES/DRESSINGS) ×1 IMPLANT
DRAPE LAPAROSCOPIC ABDOMINAL (DRAPES) ×2 IMPLANT
DRAPE UTILITY XL STRL (DRAPES) ×2 IMPLANT
ELECT COATED BLADE 2.86 ST (ELECTRODE) ×2 IMPLANT
ELECT REM PT RETURN 9FT ADLT (ELECTROSURGICAL) ×2
ELECTRODE REM PT RTRN 9FT ADLT (ELECTROSURGICAL) ×1 IMPLANT
GLOVE SURG ENC MOIS LTX SZ7.5 (GLOVE) ×2 IMPLANT
GOWN STRL REUS W/ TWL LRG LVL3 (GOWN DISPOSABLE) ×3 IMPLANT
GOWN STRL REUS W/TWL LRG LVL3 (GOWN DISPOSABLE) ×6
ILLUMINATOR WAVEGUIDE N/F (MISCELLANEOUS) IMPLANT
KIT MARKER MARGIN INK (KITS) ×2 IMPLANT
LIGHT WAVEGUIDE WIDE FLAT (MISCELLANEOUS) IMPLANT
NDL SAFETY ECLIPSE 18X1.5 (NEEDLE) IMPLANT
NEEDLE HYPO 18GX1.5 SHARP (NEEDLE)
NEEDLE HYPO 25X1 1.5 SAFETY (NEEDLE) ×2 IMPLANT
NS IRRIG 1000ML POUR BTL (IV SOLUTION) ×2 IMPLANT
PACK BASIN DAY SURGERY FS (CUSTOM PROCEDURE TRAY) ×2 IMPLANT
PENCIL SMOKE EVACUATOR (MISCELLANEOUS) ×2 IMPLANT
SLEEVE SCD COMPRESS KNEE MED (STOCKING) ×2 IMPLANT
SPONGE T-LAP 18X18 ~~LOC~~+RFID (SPONGE) ×2 IMPLANT
SUT MON AB 4-0 PC3 18 (SUTURE) ×4 IMPLANT
SUT SILK 2 0 SH (SUTURE) IMPLANT
SUT VICRYL 3-0 CR8 SH (SUTURE) ×2 IMPLANT
SYR 3ML 23GX1 SAFETY (SYRINGE) ×2 IMPLANT
SYR CONTROL 10ML LL (SYRINGE) IMPLANT
TOWEL GREEN STERILE FF (TOWEL DISPOSABLE) ×2 IMPLANT
TRACER MAGTRACE VIAL (MISCELLANEOUS) ×2 IMPLANT
TRAY FAXITRON CT DISP (TRAY / TRAY PROCEDURE) ×2 IMPLANT
TUBE CONNECTING 20X1/4 (TUBING) ×2 IMPLANT
YANKAUER SUCT BULB TIP NO VENT (SUCTIONS) ×2 IMPLANT

## 2021-01-26 NOTE — Interval H&P Note (Signed)
History and Physical Interval Note:  01/26/2021 1:37 PM  Veronica Dudley  has presented today for surgery, with the diagnosis of RIGHT BREAST CANCER.  The various methods of treatment have been discussed with the patient and family. After consideration of risks, benefits and other options for treatment, the patient has consented to  Procedure(s): RIGHT BREAST LUMPECTOMY WITH RADIOACTIVE SEED AND SENTINEL LYMPH NODE BIOPSY (Right) as a surgical intervention.  The patient's history has been reviewed, patient examined, no change in status, stable for surgery.  I have reviewed the patient's chart and labs.  Questions were answered to the patient's satisfaction.     Autumn Messing III

## 2021-01-26 NOTE — Transfer of Care (Signed)
Immediate Anesthesia Transfer of Care Note  Patient: Veronica Dudley  Procedure(s) Performed: RIGHT BREAST LUMPECTOMY WITH RADIOACTIVE SEED AND SENTINEL LYMPH NODE BIOPSY (Right: Breast)  Patient Location: PACU  Anesthesia Type:General and Regional  Level of Consciousness: drowsy  Airway & Oxygen Therapy: Patient Spontanous Breathing and Patient connected to face mask oxygen  Post-op Assessment: Report given to RN and Post -op Vital signs reviewed and stable  Post vital signs: Reviewed and stable  Last Vitals:  Vitals Value Taken Time  BP 100/67 01/26/21 1528  Temp    Pulse 87 01/26/21 1530  Resp 18 01/26/21 1530  SpO2 99 % 01/26/21 1530  Vitals shown include unvalidated device data.  Last Pain:  Vitals:   01/26/21 1204  TempSrc: Oral  PainSc: 0-No pain         Complications: No notable events documented.

## 2021-01-26 NOTE — Anesthesia Preprocedure Evaluation (Signed)
Anesthesia Evaluation  Patient identified by MRN, date of birth, ID band Patient awake    Reviewed: Allergy & Precautions, H&P , NPO status , Patient's Chart, lab work & pertinent test results  History of Anesthesia Complications (+) PONV and history of anesthetic complications  Airway Mallampati: II   Neck ROM: full    Dental   Pulmonary former smoker,    breath sounds clear to auscultation       Cardiovascular negative cardio ROS   Rhythm:regular Rate:Normal     Neuro/Psych    GI/Hepatic   Endo/Other    Renal/GU      Musculoskeletal   Abdominal   Peds  Hematology   Anesthesia Other Findings   Reproductive/Obstetrics                             Anesthesia Physical Anesthesia Plan  ASA: 2  Anesthesia Plan: General   Post-op Pain Management:  Regional for Post-op pain   Induction: Intravenous  PONV Risk Score and Plan: 4 or greater and Dexamethasone, Ondansetron, Midazolam and Treatment may vary due to age or medical condition  Airway Management Planned: LMA  Additional Equipment:   Intra-op Plan:   Post-operative Plan: Extubation in OR  Informed Consent: I have reviewed the patients History and Physical, chart, labs and discussed the procedure including the risks, benefits and alternatives for the proposed anesthesia with the patient or authorized representative who has indicated his/her understanding and acceptance.     Dental advisory given  Plan Discussed with: CRNA, Anesthesiologist and Surgeon  Anesthesia Plan Comments:         Anesthesia Quick Evaluation

## 2021-01-26 NOTE — Op Note (Signed)
01/26/2021  3:31 PM  PATIENT:  Veronica Dudley  55 y.o. female  PRE-OPERATIVE DIAGNOSIS:  RIGHT BREAST CANCER  POST-OPERATIVE DIAGNOSIS:  RIGHT BREAST CANCER  PROCEDURE:  Procedure(s): RIGHT BREAST LUMPECTOMY WITH RADIOACTIVE SEED LOCALIZATION AND DEEP RIGHT AXILLARY SENTINEL LYMPH NODE BIOPSY (Right)  SURGEON:  Surgeon(s) and Role:    * Jovita Kussmaul, MD - Primary  PHYSICIAN ASSISTANT:   ASSISTANTS: Dr. Radene Knee   ANESTHESIA:   local and general  EBL:  1 mL   BLOOD ADMINISTERED:none  DRAINS: none   LOCAL MEDICATIONS USED:  MARCAINE     SPECIMEN:  Source of Specimen:  right breast tissue and sentinel node x 2  DISPOSITION OF SPECIMEN:  PATHOLOGY  COUNTS:  YES  TOURNIQUET:  * No tourniquets in log *  DICTATION: .Dragon Dictation  After informed consent was obtained the patient was brought to the operating room and placed in the supine position on the operating table.  After adequate induction of general anesthesia the patient's right chest, breast, and axillary area were prepped with ChloraPrep, allowed to dry, and draped in usual sterile manner.  An appropriate timeout was performed.  Next 2 cc of iron oxide were injected into the subareolar plexus and the breast was massaged for 5 minutes.  Previously an I-125 seed was placed in the far upper outer quadrant of the right breast to mark an area of invasive breast cancer.  Also earlier in the day the patient underwent injection of 1 mCi of technetium sulfur colloid in the subareolar position on the right.  The neoprobe was initially set to technetium and an area of radioactivity was readily identified in the right axilla.  The neoprobe was then set to I-125 in the area of the radioactive seed was also identified very close to the axilla in the upper outer quadrant of the right breast.  Because of the proximity of the 2 signals I elected to do both operations through the same incision.  An elliptical incision was made in the skin  overlying the area of radioactivity from the seed with a 15 blade knife.  The incision was carried through the skin and subcutaneous tissue sharply with the electrocautery.  Dissection was then carried around the radioactive seed while checking the area of radioactivity frequently.  Once the specimen was removed it was oriented with the appropriate paint colors.  A specimen radiograph was obtained that showed the clip and seed to be near the center of the specimen.  The specimen was then sent to pathology for further evaluation.  The Sentimag was then used to also identify a lymph node signal in the deep right axillary space.  The Sentimag was used to direct blunt dissection into the axilla.  The hot lymph node was identified.  It was excised sharply with the electrocautery and the surrounding small vessels and lymphatics were controlled with clips.  Ex vivo counts on this node were approximately 3000.  Another very tiny lymph node that had iron oxide tracer and it was also identified and removed sharply with the electrocautery.  These were sent as sentinel nodes numbers 1 and 2.  No other hot or palpable nodes were identified in the right axilla.  Hemostasis was achieved using the Bovie electrocautery.  The wound was irrigated with saline and infiltrated with quarter percent Marcaine.  The axilla was then closed with interrupted 3-0 Vicryl stitches.  The lumpectomy cavity was also closed with layers of interrupted 3-0 Vicryl stitches.  The cavity  had been marked with clips.  The skin was then closed with a running 4-0 Monocryl subcuticular stitch.  Dermabond dressings were applied.  The patient tolerated the procedure well.  At the end of the case all needle sponge and instrument counts were correct.  The patient was then awakened and taken to recovery in stable condition.I was personally present during the key and critical portions of this procedure and immediately available throughout the entire procedure, as  documented in my operative note.   PLAN OF CARE: Discharge to home after PACU  PATIENT DISPOSITION:  PACU - hemodynamically stable.   Delay start of Pharmacological VTE agent (>24hrs) due to surgical blood loss or risk of bleeding: not applicable

## 2021-01-26 NOTE — Discharge Instructions (Signed)

## 2021-01-26 NOTE — Progress Notes (Signed)
Nuc Med at bedside, RN remained at bedside to provide emotional support. Patient tolerated well.

## 2021-01-26 NOTE — H&P (Signed)
REFERRING PHYSICIAN: Derek Jack, MD  PROVIDER: Landry Corporal, MD  MRN: [id number] DOB: Apr 21, 1966  Subjective   Chief Complaint: surgery consult   History of Present Illness: Veronica Dudley is a 55 y.o. female who is seen today as an office consultation at the request of Dr. Delton Coombes for evaluation of surgery consult .   We are asked to see the patient in consultation by Dr. Delton Coombes to evaluate her for a new right breast cancer. The patient is a 55 year old black female who recently went for a routine screening mammogram. At that time she was found to have an 8 mm area of distortion in the upper outer quadrant of the right breast. The axilla looked negative. The mass was biopsied and came back as a invasive ductal cancer that was ER positive and PR negative with a Ki-67 of 1%. The HER2 has not been reported yet. 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 She declined antiestrogens at that time. She is otherwise in good health and does not smoke.  Review of Systems: A complete review of systems was obtained from the patient. I have reviewed this information and discussed as appropriate with the patient. See HPI as well for other ROS.  ROS   Medical History: History reviewed. No pertinent past medical history.  There is no problem list on file for this patient.  Past Surgical History:  Procedure Laterality Date   CESAREAN SECTION N/A    Allergies  Allergen Reactions   Cortisone Diarrhea and Other (See Comments)  Injection form: "caused my chest to get tight; gave diarrhea really bad"   No current outpatient medications on file prior to visit.   No current facility-administered medications on file prior to visit.   Family History  Problem Relation Age of Onset   High blood pressure (Hypertension) Mother   Heart valve disease Father    Social History   Tobacco Use  Smoking Status  Never Smoker  Smokeless Tobacco Never Used    Social History   Socioeconomic History   Marital status: Married  Tobacco Use   Smoking status: Never Smoker   Smokeless tobacco: Never Used  Substance and Sexual Activity   Alcohol use: Yes   Drug use: Never   Objective:   Vitals:  BP: 132/80  Pulse: (!) 125  Temp: 36.6 C (97.9 F)  SpO2: 100%  Weight: 73.9 kg (163 lb)  Height: 165.1 cm (_0 )   Body mass index is 27.12 kg/m.  Physical Exam Vitals reviewed.  Constitutional:  General: She is not in acute distress. Appearance: Normal appearance.  HENT:  Head: Normocephalic and atraumatic.  Right Ear: External ear normal.  Left Ear: External ear normal.  Nose: Nose normal.  Mouth/Throat:  Mouth: Mucous membranes are moist.  Pharynx: Oropharynx is clear.  Eyes:  General: No scleral icterus. Extraocular Movements: Extraocular movements intact.  Conjunctiva/sclera: Conjunctivae normal.  Pupils: Pupils are equal, round, and reactive to light.  Cardiovascular:  Rate and Rhythm: Normal rate and regular rhythm.  Pulses: Normal pulses.  Heart sounds: Normal heart sounds.  Pulmonary:  Effort: Pulmonary effort is normal. No respiratory distress.  Breath sounds: Normal breath sounds.  Abdominal:  General: Bowel sounds are normal.  Palpations: Abdomen is soft.  Tenderness: There is no abdominal tenderness.  Musculoskeletal:  General: No swelling, tenderness or deformity. Normal range of motion.  Cervical back: Normal range of motion and neck  supple.  Skin: General: Skin is warm and dry.  Coloration: Skin is not jaundiced.  Neurological:  General: No focal deficit present.  Mental Status: She is alert and oriented to person, place, and time.  Psychiatric:  Mood and Affect: Mood normal.  Behavior: Behavior normal.   Breast: There is no palpable mass in either breast. There is no palpable axillary, supraclavicular, or cervical lymphadenopathy. There is a well-healed  upper left breast and axillary scar.  Labs, Imaging and Diagnostic Testing:  Assessment and Plan:  Diagnoses and all orders for this visit:  Malignant neoplasm of upper-outer quadrant of right breast in female, estrogen receptor positive (CMS-HCC)    The patient appears to have a small 8 mm stage I cancer with clinically negative nodes in the upper outer quadrant of the right breast. I have discussed with her in detail the different options for treatment and at this point she favors breast conservation which I feel is very reasonable. She is also a good candidate for sentinel node biopsy. I have discussed with her in detail the risks and benefits of the operation as well as some of the technical aspects including the use of a radioactive seed for localization and she understands and wishes to proceed. We will call her with the results of the HER2 test once it is back. I will refer her to medical and radiation oncology to discuss adjuvant therapy. She may also qualify for genetics since this seems to be her second breast cancer.

## 2021-01-26 NOTE — Progress Notes (Signed)
  Assisted Dr. Hodierne with right, ultrasound guided, pectoralis block. Side rails up, monitors on throughout procedure. See vital signs in flow sheet. Tolerated Procedure well. 

## 2021-01-26 NOTE — Anesthesia Procedure Notes (Signed)
Procedure Name: LMA Insertion Date/Time: 01/26/2021 2:13 PM Performed by: Ezequiel Kayser, CRNA Pre-anesthesia Checklist: Patient identified, Emergency Drugs available, Suction available and Patient being monitored Patient Re-evaluated:Patient Re-evaluated prior to induction Oxygen Delivery Method: Circle System Utilized Preoxygenation: Pre-oxygenation with 100% oxygen Induction Type: IV induction Ventilation: Mask ventilation without difficulty LMA: LMA inserted LMA Size: 4.0 Number of attempts: 1 Airway Equipment and Method: Bite block Placement Confirmation: positive ETCO2 Tube secured with: Tape Dental Injury: Teeth and Oropharynx as per pre-operative assessment

## 2021-01-26 NOTE — Anesthesia Procedure Notes (Signed)
Anesthesia Regional Block: Pectoralis block   Pre-Anesthetic Checklist: , timeout performed,  Correct Patient, Correct Site, Correct Laterality,  Correct Procedure, Correct Position, site marked,  Risks and benefits discussed,  Surgical consent,  Pre-op evaluation,  At surgeon's request and post-op pain management  Laterality: Right  Prep: chloraprep       Needles:  Injection technique: Single-shot  Needle Type: Echogenic Needle     Needle Length: 9cm  Needle Gauge: 21     Additional Needles:   Narrative:  Start time: 01/26/2021 12:33 PM End time: 01/26/2021 12:42 PM Injection made incrementally with aspirations every 5 mL.  Performed by: Personally  Anesthesiologist: Albertha Ghee, MD  Additional Notes: Pt tolerated the procedure well.

## 2021-01-27 ENCOUNTER — Encounter (HOSPITAL_BASED_OUTPATIENT_CLINIC_OR_DEPARTMENT_OTHER): Payer: Self-pay | Admitting: General Surgery

## 2021-01-31 ENCOUNTER — Inpatient Hospital Stay (HOSPITAL_COMMUNITY): Payer: BC Managed Care – PPO | Admitting: Hematology

## 2021-02-01 LAB — SURGICAL PATHOLOGY

## 2021-02-06 NOTE — Anesthesia Postprocedure Evaluation (Signed)
Anesthesia Post Note  Patient: Veronica Dudley  Procedure(s) Performed: RIGHT BREAST LUMPECTOMY WITH RADIOACTIVE SEED AND SENTINEL LYMPH NODE BIOPSY (Right: Breast)     Patient location during evaluation: PACU Anesthesia Type: General Level of consciousness: awake and alert Pain management: pain level controlled Vital Signs Assessment: post-procedure vital signs reviewed and stable Respiratory status: spontaneous breathing, nonlabored ventilation, respiratory function stable and patient connected to nasal cannula oxygen Cardiovascular status: blood pressure returned to baseline and stable Postop Assessment: no apparent nausea or vomiting Anesthetic complications: no   No notable events documented.  Last Vitals:  Vitals:   01/26/21 1615 01/26/21 1746  BP:  128/75  Pulse: 84 75  Resp: 13 20  Temp:  36.6 C  SpO2: 100% 98%    Last Pain:  Vitals:   01/26/21 1746  TempSrc: Oral  PainSc: 0-No pain                 Nickolette Espinola S

## 2021-02-07 NOTE — Progress Notes (Signed)
[facility] [address], [place] [address]   Patient Care Team: Sharilyn Sites, MD as PCP - General (Family Medicine) Derek Jack, MD as Medical Oncologist (Medical Oncology)  CHIEF COMPLAINTS/PURPOSE OF CONSULTATION:  Newly diagnosed right breast cancer and history of left breast DCIS  HISTORY OF PRESENTING ILLNESS:  Veronica Dudley 55 y.o. female is here because of recent diagnosis of right breast cancer and history of left breast DCIS. She was last seen at the clinic by Francene Finders, NP-C on 05/25/2019.   Today she reports feeling good. She no longer has menses as she reports she underwent ablation and unilateral oophorectomy, and she denies hot flashes and night sweats. She reports occasional vaginal dryness. She denies vaginal and rectal bleeding. She is not currently taking iron tablets as it caused constipation, and she was unable to tolerate IV Iron as it caused a rash on her face. She reports occasional anxiety attacks. She reports soreness in her right breast, shoulder, and arm. She denies tingling/numbness. She currently works at a bank. She denies extensive smoking history. Her maternal aunt passed from colon cancer at age 24. She denies family history of osteoporosis.   In terms of breast cancer risk profile:  She menarched at early age of 30 and underwent endometrial ablation and unilateral oophorectomy She had 2 pregnancies, her first child was born at age 79  She has received birth control pills for approximately 4 years.  She was exposed to fertility medications or hormone replacement therapy; she used an estrogen cream for her vaginal dryness for 8 months before stopping.  She has family history of Breast/GYN/GI cancer  I reviewed her records extensively and collaborated the history with the patient.  SUMMARY OF ONCOLOGIC HISTORY: Oncology History   No history exists.   MEDICAL HISTORY:  Past Medical History:  Diagnosis Date   Breast  CA ([facility] [place]) 09/07/2011   Breast cancer ([place]) 09/2008   DCIS   Breast cancer ([facility]) 11/28/2020   Right Breast   Cancer ([place])    lt breast ca 2010/surg-lumpectomy/rad tx   Endometriosis    H/O bilateral oophorectomy 03/19/2014   Iron deficiency anemia    Iron deficiency anemia 09/07/2011   PONV (postoperative nausea and vomiting)     SURGICAL HISTORY: Past Surgical History:  Procedure Laterality Date   ABDOMINAL HYSTERECTOMY  2010   BILATERAL OOPHORECTOMY Bilateral 2010   BREAST LUMPECTOMY WITH RADIOACTIVE SEED AND SENTINEL LYMPH NODE BIOPSY Right 01/26/2021   Procedure: RIGHT BREAST LUMPECTOMY WITH RADIOACTIVE SEED AND SENTINEL LYMPH NODE BIOPSY;  Surgeon: Jovita Kussmaul, MD;  Location: [facility];  Service: General;  Laterality: Right;   CESAREAN SECTION     CHOLECYSTECTOMY  2005   lap choli   COLONOSCOPY N/A 05/12/2015   Procedure: COLONOSCOPY;  Surgeon: Rogene Houston, MD;  [facility];  Service: Endoscopy;  Laterality: N/A;  955 - moved to 1/12 @ 9:30   COLONOSCOPY N/A 08/04/2020   Procedure: COLONOSCOPY;  Surgeon: Rogene Houston, MD;  [facility];  Service: Endoscopy;  Laterality: N/A;  am   DIAGNOSTIC LAPAROSCOPY  2004   incisional mass   LAPAROSCOPY  2006   LOA   LIPOSUCTION WITH LIPOFILLING Left 05/13/2014   Procedure: LIPOSUCTION WITH LIPOFILLING FOR SYMMETRY;  Surgeon: Theodoro Kos, DO;  Location: [facility];  Service: Plastics;  Laterality: Left;  liposuction from abdomen, lipofilling to left breast   lumpectomy  2010  left lump-snbx   POLYPECTOMY  08/04/2020   Procedure: POLYPECTOMY;  Surgeon: Rogene Houston, MD;  [facility];  Service: Endoscopy;;    SOCIAL HISTORY: Social History   Socioeconomic History   Marital status: Married    Spouse name: Not on file   Number of children: Not on file   Years of education: Not on file   Highest education level: Not on file  Occupational History   Not on  file  Tobacco Use   Smoking status: Former    Types: Cigarettes    Quit date: 05/06/1996    Years since quitting: 24.7   Smokeless tobacco: Never  Vaping Use   Vaping Use: Never used  Substance and Sexual Activity   Alcohol use: Yes    Alcohol/week: 2.0 standard drinks    Types: 2 Glasses of wine per week    Comment: red wine, glass a night   Drug use: No   Sexual activity: Yes  Other Topics Concern   Not on file  Social History Narrative   Not on file   Social Determinants of Health   Financial Resource Strain: Not on file  Food Insecurity: Not on file  Transportation Needs: Not on file  Physical Activity: Not on file  Stress: Not on file  Social Connections: Not on file  Intimate Partner Violence: Not At Risk   Fear of Current or Ex-Partner: No   Emotionally Abused: No   Physically Abused: No   Sexually Abused: No    FAMILY HISTORY: Family History  Problem Relation Age of Onset   Heart attack Father    Colon cancer Maternal Aunt     ALLERGIES:  is allergic to cortisone.  MEDICATIONS:  Current Outpatient Medications  Medication Sig Dispense Refill   Multiple Vitamins-Minerals (WOMENS MULTIVITAMIN PO) Take 1 tablet by mouth daily.     ALPRAZolam (XANAX) 0.5 MG tablet Take 0.5 mg by mouth daily as needed for anxiety. (Patient not taking: Reported on 02/08/2021)     HYDROcodone-acetaminophen (NORCO/VICODIN) 5-325 MG tablet Take 1 tablet by mouth every 6 (six) hours as needed for moderate pain or severe pain. (Patient not taking: Reported on 02/08/2021) 15 tablet 0   No current facility-administered medications for this visit.    REVIEW OF SYSTEMS:   Review of Systems  Constitutional:  Negative for appetite change and fatigue (70%).  Gastrointestinal:  Negative for blood in stool.  Endocrine: Negative for hot flashes.  Genitourinary:  Negative for vaginal bleeding.        Vaginal dryness  Musculoskeletal:  Positive for arthralgias (6/10 R shoulder) and flank  pain (6/10 R side).  Neurological:  Negative for numbness.  Psychiatric/Behavioral:  The patient is nervous/anxious.   All other systems reviewed and are negative.  PHYSICAL EXAMINATION: ECOG PERFORMANCE STATUS: 1 - Symptomatic but completely ambulatory  Vitals:   02/08/21 0929  BP: 133/88  Pulse: 95  Resp: 17  Temp: (!) 96.9 F (36.1 C)  SpO2: 99%   Filed Weights   02/08/21 0929  Weight: 163 lb (73.9 kg)   Physical Exam Vitals reviewed.  Constitutional:      Appearance: Normal appearance.  Cardiovascular:     Rate and Rhythm: Normal rate and regular rhythm.     Pulses: Normal pulses.     Heart sounds: Normal heart sounds.  Pulmonary:     Effort: Pulmonary effort is normal.     Breath sounds: Normal breath sounds.  Chest:  Breasts:  Right: Tenderness present.     Left: Normal.  Abdominal:     Palpations: Abdomen is soft. There is no hepatomegaly, splenomegaly or mass.     Tenderness: There is no abdominal tenderness.  Neurological:     General: No focal deficit present.     Mental Status: She is alert and oriented to person, place, and time.  Psychiatric:        Mood and Affect: Mood normal.        Behavior: Behavior normal.    Breast Exam Chaperone: Thana Ates    LABORATORY DATA:  I have reviewed the data as listed Recent Results (from the past 2160 hour(s))  Vitamin D 25 hydroxy     Status: None   Collection Time: 12/28/20  9:08 AM  Result Value Ref Range   Vit D, 25-Hydroxy 31.36 30 - 100 ng/mL    Comment: (NOTE) Vitamin D deficiency has been defined by the [place] practice guideline as a level of serum 25-OH  vitamin D less than 20 ng/mL (1,2). The Endocrine Society went on to  further define vitamin D insufficiency as a level between 21 and 29  ng/mL (2).  1. IOM (Institute of Medicine). 2010. Dietary reference intakes for  calcium and D. [place]: The [employer]. 2. Holick MF,  Binkley Oberlin, Bischoff-Ferrari HA, et al. Evaluation,  treatment, and prevention of vitamin D deficiency: an Endocrine  Society clinical practice guideline, JCEM. 2011 Jul; 96(7): 1911-30.  Performed at [facility], [place] [address] [place], [place] [address]   Folate     Status: None   Collection Time: 12/28/20  9:08 AM  Result Value Ref Range   Folate 21.3 >5.9 ng/mL    Comment: Performed at [facility], [address] [place], [place] [address]  Iron and TIBC     Status: None   Collection Time: 12/28/20  9:08 AM  Result Value Ref Range   Iron 98 28 - 170 ug/dL   TIBC 356 250 - 450 ug/dL   Saturation Ratios 27 10.4 - 31.8 %   UIBC 258 ug/dL    Comment: Performed at [facility], [address], [place], [place] [address]  Ferritin     Status: None   Collection Time: 12/28/20  9:08 AM  Result Value Ref Range   Ferritin 46 11 - 307 ng/mL    Comment: Performed at [facility], [address], [place], [place] [address]  Vitamin B12     Status: None   Collection Time: 12/28/20  9:08 AM  Result Value Ref Range   Vitamin B-12 416 180 - 914 pg/mL    Comment: (NOTE) This assay is not validated for testing neonatal or myeloproliferative syndrome specimens for Vitamin B12 levels. Performed at [facility], [address] [place], [place] [address]   Lactate dehydrogenase     Status: None   Collection Time: 12/28/20  9:08 AM  Result Value Ref Range   LDH 130 98 - 192 U/L    Comment: Performed at [facility], [address], [place], [place] [address]  Comprehensive metabolic panel     Status: None   Collection Time: 12/28/20  9:08 AM  Result Value Ref Range   Sodium 140 135 - 145 mmol/L   Potassium 3.8 3.5 - 5.1 mmol/L   Chloride 105 98 - 111 mmol/L   CO2 28 22 - 32 mmol/L   Glucose, Bld 91 70 -  99 mg/dL    Comment: Glucose reference range applies only to samples taken after fasting for at least 8 hours.   BUN 8 6 - 20 mg/dL   Creatinine, Ser 0.62 0.44 - 1.00 mg/dL    Calcium 9.2 8.9 - 10.3 mg/dL   Total Protein 7.3 6.5 - 8.1 g/dL   Albumin 4.1 3.5 - 5.0 g/dL   AST 30 15 - 41 U/L   ALT 26 0 - 44 U/L   Alkaline Phosphatase 57 38 - 126 U/L   Total Bilirubin 0.6 0.3 - 1.2 mg/dL   GFR, Estimated >60 >60 mL/min    Comment: (NOTE) Calculated using the CKD-EPI Creatinine Equation (2021)    Anion gap 7 5 - 15    Comment: Performed at [facility], [address], [place], [place] [address]  CBC with Differential     Status: Abnormal   Collection Time: 12/28/20  9:08 AM  Result Value Ref Range   WBC 3.5 (L) 4.0 - 10.5 K/uL   RBC 4.01 3.87 - 5.11 MIL/uL   Hemoglobin 11.3 (L) 12.0 - 15.0 g/dL   HCT 36.3 36.0 - 46.0 %   MCV 90.5 80.0 - 100.0 fL   MCH 28.2 26.0 - 34.0 pg   MCHC 31.1 30.0 - 36.0 g/dL   RDW 12.7 11.5 - 15.5 %   Platelets 220 150 - 400 K/uL   nRBC 0.0 0.0 - 0.2 %   Neutrophils Relative % 52 %   Neutro Abs 1.9 1.7 - 7.7 K/uL   Lymphocytes Relative 37 %   Lymphs Abs 1.3 0.7 - 4.0 K/uL   Monocytes Relative 7 %   Monocytes Absolute 0.2 0.1 - 1.0 K/uL   Eosinophils Relative 3 %   Eosinophils Absolute 0.1 0.0 - 0.5 K/uL   Basophils Relative 1 %   Basophils Absolute 0.0 0.0 - 0.1 K/uL   Immature Granulocytes 0 %   Abs Immature Granulocytes 0.01 0.00 - 0.07 K/uL    Comment: Performed at [facility], [address], [place], [place] [address]  Surgical pathology     Status: None   Collection Time: 01/03/21  8:32 AM  Result Value Ref Range   SURGICAL PATHOLOGY      SURGICAL PATHOLOGY ** THIS IS AN ADDENDUM REPORT ** CASE: [id number] PATIENT: Veronica Dudley Surgical Pathology Report **Addendum **  Reason for Addendum #1:  Breast Biomarker Results Reason for Addendum #2:  Molecular Genetic Test Results, FISH  Clinical History: mass     FINAL MICROSCOPIC DIAGNOSIS:  A. BREAST, 9:00, MASS, RIGHT, BIOPSY: - Invasive ductal carcinoma, grade 1/2. - See comment.  COMMENT: The greatest tumor dimension is 0.6 cm.  Breast prognostic  profile will be performed.  Call to Electa Sniff on 01/04/2021.   GROSS DESCRIPTION:  Received in formalin, time in formalin 8:20 AM and cold ischemic time less than 1 minute, are 3 cores of soft tan-yellow tissue measuring 1.6 to 1.8 cm in length and each 0.2 cm in diameter.  The specimen is submitted in toto.  [facility] 01/03/2021)   Final Diagnosis performed by Claudette Laws, MD.   Electronically signed 01/04/2021 Technical component performed at [facility], [address] [place], [place] [address].  Professional component performed at [facility], [place] [address], [place], [place] [address].  Immunohistochemistry Technical component (if applicable) was performed at [facility]. [address] [place], [place], [place] [address].   IMMUNOHISTOCHEMISTRY DISCLAIMER (if applicable): Some of these  immunohistochemical stains may have been developed and the performance characteristics determine by [facility]. Some may not have been cleared or approved by the U.S. Food and Drug Administration. The FDA has determined that such clearance or approval is not necessary. This test is used for clinical purposes. It should not be regarded as investigational or for research. This laboratory is certified under the [place] (CLIA-88) as qualified to perform high complexity clinical laboratory testing.  The controls stained appropriately.  ADDENDU M:  PROGNOSTIC INDICATOR RESULTS:  Immunohistochemical and morphometric analysis performed manually  By immunohistochemistry, HER-2 is EQUIVOCAL (2+).  HER-2 by FISH is pending and will be reported in an addendum.  Estrogen Receptor: POSITIVE, 50% MODERATE STAINING INTENSITY Progesterone Receptor: NEGATIVE Proliferation Marker Ki-67: 1%  Comment: The negative hormone receptor study(ies) in the case has an internal positive  control.  Reference Range Estrogen and Progesterone Receptor      Negative  0%      Positive  >1%  All controls stained appropriately.          Addendum #1 performed by Thressa Sheller, MD.   Electronically signed 01/06/2021 Technical component performed at [facility], [place] [address] [place], [place] [address].  Professional component performed at [employer]. [facility], [place] [address] [place], [place] [address].  Immunohistochemistry Technical component (if applicable) was performed at [facility]. [address], [place], [place], [place] [address].   IMMUNOHISTOCHEMISTRY DISCLAIMER (if applicable): Some of these immunohistochemical stains may have been developed and the performance characteristics determine by [facility]. Some may not have been cleared or approved by the U.S. Food and Drug Administration. The FDA has determined that such clearance or approval is not necessary. This test is used for clinical purposes. It should not be regarded as investigational or for research. This laboratory is certified under the [place] (CLIA-88) as qualified to perform high complexity clinical laboratory testing.  The controls stained appropriately.  ADDENDUM:  FLOURESCENCE IN-SITU HYBRIDIZATION RESULTS:  GROUP 5:   HER2 **NEGATIVE**  On the tissue sample received from this individual HER2 FISH was performed by a technologist and cell imaging and analysis on the [employer].  RATIO of HER2/CEN 17 SIGNALS : 1.22 AVERAGE HER2 COPY NUMBER PER CELL: 1.95  The ratio of HER-2 CEP 17 is within the range < 2.0 of HER-2: CEP 17 in normal breast tissue and a copy number of HER2 signals per cell is <4.0. Arch Pathol Lab Med 1:1,2018.      Addendum #2 performed by Thressa Sheller, MD.   Electronically signed 01/14/2021 Technical component performed at [facility], [place] [address], [place], [place] [address].  Professional component performed at [employer]. [facility], [place] [address], [place], [place] [address].  Immunohistochemistry Technical component (if applicable) was performed at [facility]. [address] [place], [place],  [address].   IMMUNOHISTOCHEMISTRY DISCLAIMER (if applicable): Some of these immunohistochemical stains may have been developed and the performance characteristics determine by [facility]. Some may not have been cleared or approved by the U.S. Food and Drug Administration. The FDA has det ermined that such clearance or approval is not necessary. This test is used for clinical purposes. It should not be regarded as investigational or for research. This laboratory is certified under the [place] (CLIA-88) as qualified to perform high complexity clinical laboratory  testing.  The controls stained appropriately.   Surgical pathology     Status: None   Collection Time: 01/26/21  1:10 PM  Result Value Ref Range   SURGICAL PATHOLOGY      SURGICAL PATHOLOGY CASE: [id number] PATIENT: Veronica Dudley Surgical Pathology Report     Clinical History: right breast cancer (cm)   FINAL MICROSCOPIC DIAGNOSIS:  A. BREAST, RIGHT, LUMPECTOMY: -  Invasive ductal carcinoma, Nottingham grade 1 of 3, 0.9 cm -  Margins uninvolved by carcinoma (0.4 cm; superior margin) -  Previous biopsy site changes present -  See oncology table [and comment] below  B. LYMPH NODE, RIGHT AXILLARY #1, SENTINEL, EXCISION: -  No carcinoma identified in one lymph node (0/1)  C. LYMPH NODE, RIGHT AXILLARY, SENTINEL, EXCISION: -  No carcinoma identified in one lymph node (0/1)  D. LYMPH NODE, RIGHT AXILLARY, SENTINEL, EXCISION: -  No carcinoma identified in one lymph node (0/1)  E. LYMPH NODE, RIGHT AXILLARY, SENTINEL, EXCISION: -  No carcinoma identified in one  lymph node (0/1)  F. LYMPH NODE, RIGHT AXILLARY, SENTINEL, EXCISION: -  No carcinoma identified in one lymph node (0/1)  G. LYMPH NODE, RIGHT AXILLARY, SENTINEL, EXCI SION: -  No carcinoma identified in one lymph node (0/1)  H. LYMPH NODE, RIGHT AXILLARY, SENTINEL, EXCISION: -  No carcinoma identified in one lymph node (0/1)  I. LYMPH NODE, RIGHT AXILLARY #2, SENTINEL, EXCISION: -  No carcinoma identified in one lymph node (0/1)  ONCOLOGY TABLE:  INVASIVE CARCINOMA OF THE BREAST:  Resection  Procedure: Excision Specimen Laterality: Right Histologic Type: Invasive carcinoma of no special type (ductal, NOS) Histologic Grade:      Glandular (Acinar)/Tubular Differentiation: Score 2      Nuclear Pleomorphism: Score 2      Mitotic Rate: Score 1      Overall Grade: Nottingham grade 1 Tumor Size: 0.9 cm (glass slide measurement) Ductal Carcinoma In Situ: Not identified Tumor Extent: Limited to breast parenchyma Treatment Effect in the Breast: No known presurgical therapy Margins: All margins negative for invasive carcinoma      Distance from Closest Margin (mm): 4      Specify Closest Margin (required only if <22m): [place] Re gional Lymph Nodes:      Number of Lymph Nodes Examined: 8      Number of Sentinel Nodes Examined: 8      Number of Lymph Nodes with Macrometastases (>2 mm): 0      Number of Lymph Nodes with Micrometastases: 0      Number of Lymph Nodes with Isolated Tumor Cells (=0.2 mm or =200 cells): 0      Size of Largest Metastatic Deposit (mm): N/A      Extranodal Extension: N/A Distant Metastasis:      Distant Site(s) Involved: N/A Breast Biomarker Testing Performed on Previous Biopsy:      Testing Performed on Case Number: [id number]           Estrogen Receptor: POSITIVE, 50% MODERATE            Progesterone Receptor: NEGATIVE            HER2: NEGATIVE (FISH, group 5)            Ki-67: 1% Pathologic Stage Classification (pTNM, AJCC 8th Edition): pT1b,  pN0 Representative Tumor Block: A3 Comment(s): None (v4.5.0.0)  GROSS DESCRIPTION:  A. Specimen type: received fresh and subsequently placed in formalin labeled with the patient's name and "Right breast tissue with radi oAllean Found  seed" is a piece of fibrofatty tissue grossly consistent with clinically stated lumpectomy. Time out of body: not recorded in OR, time in formalin: 1546. Size: 5.4 cm anterior-posterior, 4.2 cm medial-lateral, and 2.6 cm superior-inferior. On the anterior surface is a 3.9 x 2.2 cm Hammen, grossly unremarkable skin ellipse. Orientation: the specimen has been previously inked in the OR as follows: anterior - green, inferior - blue, lateral - orange, medial - yellow, posterior - black, and superior - red. Localized area: the preoperatively implanted radioactive seed is identified, removed, and sequestered according to protocol. Cut surface: sectioning from medial to lateral reveals an ill-defined, white-tan, indurated lesion measuring 1.0 cm superior-inferior, 0.8 cm medial-lateral, and 0.7 cm anterior-posterior. Within the lesion is a silver, metallic, ribbon-shaped biopsy clip. Margins: superior - 0.4 cm, inferior - 1.0 cm, medial - 3.4cm, lateral - 1.9 cm, anteri or - 2.4 cm, and posterior - 3.0 cm. The entire lesion plus adjacent normal tissue is sequentially submitted from medial to lateral as follows: A1: medial margin, perpendicular A2-A3: tissue medial to lesion A4-A5: lesion of interest with superior and inferior margins (ribbon clip and radioactive seed from cassette 4) A6: tissue lateral to lesion A7: lateral margin, perpendicular A8: anterior and posterior margins  B. Received fresh and subsequently placed in formalin labeled with the patient's name and "RT axilla sentinel lymph node" is a 1.2 x 0.8 x 0.7 cm pink-tan, rubbery possible lymph node that is sectioned and entirely submitted in cassette B1.  C. Received in the same container as part  B is a 0.7 x 0.7 x 0.5 cm pink-tan, rubbery possible lymph node that is bisected and entirely submitted in cassette C1.  D. Received in the same container as part B is a 1.0 x 0.5 x 0.5 cm pink-tan, rubbery possible lymph node that is bisected and entirely submitted in casse tte D1.  E. Received in the same container as part B is a 0.7 x 0.3 x 0.3 cm pink-tan, rubbery possible lymph node, submitted in toto in cassette E1.  F. Received in the same container as part B is a 0.4 x 0.3 x 0.3 cm pink-tan, rubbery possible lymph node, submitted in toto in cassette F1.  G. Received in the same container as part B is a 0.4 x 0.2 x 0.2 cm pink-tan, rubbery possible lymph node, submitted in toto in cassette G1.  H. Received in the same container as part B is a 0.3 x 0.2 x 0.2 cm pink-tan, rubbery possible lymph node, submitted in toto in cassette H1.  I. Received fresh and subsequently placed in formalin labeled with the patient's name and "RT axillary sentinel node #2" is a 0.5 x 0.2 x 0.2 cm tan-Langer, rubbery possible lymph node, submitted in toto in cassette I1.  (LEF 01/27/2021)   Final Diagnosis performed by Thressa Sheller, MD.   Electronically signed 02/01/2021 Technical and / or Professional components performed at [employer]. [facility], [place] [address] [place], [place] [address].  Immunohistochemistry Technical component (if applicable) was performed at [facility]. [address], [place], [place], [place] [address].   IMMUNOHISTOCHEMISTRY DISCLAIMER (if applicable): Some of these immunohistochemical stains may have been developed and the performance characteristics determine by [facility]. Some may not have been cleared or approved by the U.S. Food and Drug Administration. The FDA has determined that such clearance or approval is not necessary. This test is used for clinical purposes. It should not be regarded as investigational  or for  research. This laboratory is certified under the [place] (CLIA-88) as qualified to perform high complexity clinical laboratory testing.  The controls stained appropriately.     RADIOGRAPHIC STUDIES: I have personally reviewed the radiological reports and agreed with the findings in the report. NM Sentinel Node Inj-No Rpt (Breast)  Result Date: 01/26/2021 Sulfur Colloid was injected by the Nuclear Medicine Technologist for sentinel lymph node localization.   MM Breast Surgical Specimen  Result Date: 01/26/2021 CLINICAL DATA:  Evaluate surgical specimen following lumpectomy for RIGHT breast cancer. EXAM: SPECIMEN RADIOGRAPH OF THE RIGHT BREAST COMPARISON:  Previous exam(s). FINDINGS: Status post excision of the RIGHT breast. The radioactive seed and RIBBON biopsy marker clip are present and completely intact. IMPRESSION: Specimen radiograph of the RIGHT breast. Electronically Signed   By: Margarette Canada M.D.   On: 01/26/2021 14:55  [place] RT RADIOACTIVE SEED LOC  Result Date: 01/24/2021 CLINICAL DATA:  Patient for preoperative localization prior to right breast lumpectomy. EXAM: ULTRASOUND GUIDED RADIOACTIVE SEED LOCALIZATION OF THE RIGHT BREAST COMPARISON:  Previous exam(s). FINDINGS: Patient presents for radioactive seed localization prior to right lumpectomy. I met with the patient and we discussed the procedure of seed localization including benefits and alternatives. We discussed the high likelihood of a successful procedure. We discussed the risks of the procedure including infection, bleeding, tissue injury and further surgery. We discussed the low dose of radioactivity involved in the procedure. Informed, written consent was given. The usual time-out protocol was performed immediately prior to the procedure. Using ultrasound guidance, sterile technique, 1% lidocaine and an I-125 radioactive seed, right breast mass 9 o'clock position was localized using a  lateral approach. The follow-up mammogram images confirm the seed in the expected location and were marked for Dr. Marlou Starks. Follow-up survey of the patient confirms presence of the radioactive seed. Order number of I-125 seed:  [id number]. Total activity:  2.229 millicuries reference Date: January 04, 2021 The patient tolerated the procedure well and was released from the [facility]. She was given instructions regarding seed removal. IMPRESSION: Radioactive seed localization right breast. No apparent complications. Electronically Signed   By: Lovey Newcomer M.D.   On: 01/24/2021 11:44  MM CLIP PLACEMENT RIGHT  Result Date: 01/24/2021 CLINICAL DATA:  Patient status post seed placement right breast. EXAM: DIAGNOSTIC RIGHT MAMMOGRAM POST ULTRASOUND-GUIDED RADIOACTIVE SEED PLACEMENT COMPARISON:  Previous exam(s). FINDINGS: Mammographic images were obtained following ultrasound-guided radioactive seed placement. These demonstrate radioactive seed to be located within the right breast mass. IMPRESSION: Appropriate location of the radioactive seed. Final Assessment: Post Procedure Mammograms for Seed Placement Electronically Signed   By: Lovey Newcomer M.D.   On: 01/24/2021 11:45    ASSESSMENT:  Stage I (PT1BN0) right breast invasive ductal carcinoma: - Mammogram on 11/30/2020 was BI-RADS 0 in the right breast. - Right diagnostic mammogram on 12/22/2020 with mass in 9 o'clock position in the right breast. - Right breast 9:00 biopsy on 01/03/2021 with infiltrating ductal carcinoma, grade 1/2. - Right lumpectomy and SLNB on 01/26/2021 by Dr. Marlou Starks. - Pathology with 0.9 cm grade 1 IDC, margins negative, 0/8 lymph nodes positive, ER 50%, PR negative, HER2 2+ by IHC, HER2 negative by FISH.  Ki-67 was 1%.  History of left breast DCIS: - Lumpectomy on 10/22/2008, followed by XRT completed on 01/21/2009. - DCIS was intermediate grade, 2.7 cm, close to the chest wall, ER positive, PR negative.  2 mm of negative margins. - She  refused antiestrogen therapy.  3.  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  Menarche-13, menopause unknown as she had uterine ablation (2010 secondary to heavy cycles) and unilateral salpingo-oophorectomy.  FCB-25y.  She took OCP for 4 years.  No HRT.  Used vaginal estrogen cream for 8 months. - She is a non-smoker. - Maternal aunt died of colon cancer at age 28.  No family history of breast cancer.   PLAN:  Stage I (PT1BN0) right breast invasive ductal carcinoma, ER 50% positive, PR negative, HER2 negative: - We have reviewed pathology reports in detail. - I have recommended Oncotype DX testing. - We have discussed the role of chemotherapy if her score is high.  If not she will be offered antiestrogen therapy based on her menopausal status. - We will also obtain FSH, LH and estrogen levels. - Given history of left breast DCIS and right breast cancer, recommend genetic testing. - RTC 2 weeks to discuss results.  We will also consider referring to [facility] radiation oncology per patient request.  2.  Bone health: - Would obtain baseline DEXA scan.  Vitamin D levels on 12/28/2020 was 31.36.  3.  Iron deficiency anemia: - She has long history of iron deficiency anemia.  She has required parenteral iron in 2017. - She could not tolerate iron pills due to constipation.  She reported some rash after parenteral iron therapy. - Labs from 12/28/2020 showed ferritin 46, down from 59.  Hemoglobin is 11.3 with MCV 90.5 and stable.  I77 and folic acid was normal.     Derek Jack, MD 02/08/21 10:02 AM  [place] [phone]   I, Thana Ates, am acting as a scribe for Dr. Derek Jack.  I, Derek Jack MD, have reviewed the above documentation for accuracy and completeness, and I agree with the above.

## 2021-02-08 ENCOUNTER — Other Ambulatory Visit: Payer: Self-pay

## 2021-02-08 ENCOUNTER — Inpatient Hospital Stay (HOSPITAL_COMMUNITY): Payer: BC Managed Care – PPO | Attending: Hematology | Admitting: Hematology

## 2021-02-08 ENCOUNTER — Inpatient Hospital Stay (HOSPITAL_COMMUNITY): Payer: BC Managed Care – PPO

## 2021-02-08 VITALS — BP 133/88 | HR 95 | Temp 96.9°F | Resp 17 | Wt 163.0 lb

## 2021-02-08 DIAGNOSIS — D509 Iron deficiency anemia, unspecified: Secondary | ICD-10-CM | POA: Insufficient documentation

## 2021-02-08 DIAGNOSIS — Z170 Estrogen receptor positive status [ER+]: Secondary | ICD-10-CM | POA: Diagnosis not present

## 2021-02-08 DIAGNOSIS — C50811 Malignant neoplasm of overlapping sites of right female breast: Secondary | ICD-10-CM | POA: Insufficient documentation

## 2021-02-08 DIAGNOSIS — Z853 Personal history of malignant neoplasm of breast: Secondary | ICD-10-CM | POA: Diagnosis not present

## 2021-02-08 DIAGNOSIS — D0512 Intraductal carcinoma in situ of left breast: Secondary | ICD-10-CM

## 2021-02-08 LAB — GENETIC SCREENING ORDER

## 2021-02-08 NOTE — Patient Instructions (Addendum)
[place] at [facility] ([place]) Discharge Instructions  You were seen and examined today by Dr. Delton Coombes. Dr. Delton Coombes is a medical oncologist, meaning he specializes in the management of cancer diagnoses. Dr. Delton Coombes discussed your past medical history, family history of cancer, as well as your recent lumpectomy results.  Your recent lumpectomy revealed invasive ductal carcinoma, the most common type of breast cancer. It was completely removed by the lumpectomy and did not involve any of the removed lymph nodes. This is good news! The cancer was estrogen receptor positive, meaning that it is being fed by the estrogen that is present in your body.  Dr. Delton Coombes has recommended assessing your menopausal status with lab work, including a bone density X-Ray, as well as genetic testing to see if there is any genetic link that is placing you at a higher risk for developing cancers. The menopausal status is needed information for planning your treatment. Dr. Delton Coombes has also recommended additional testing on your tumor tissue known as Oncotype DX. This test will help determine if there is any role for chemotherapy. This test does take about 2 weeks to result.  If you do not need chemotherapy, treatment would include an anti-estrogen pill to be taken daily for at least 5 years and radiation. Radiation can be done at [facility] in [place].  Follow-up with Dr. Delton Coombes as scheduled.   Thank you for choosing [place] at [facility] to provide your oncology and hematology care.  To afford each patient quality time with our provider, please arrive at least 15 minutes before your scheduled appointment time.   If you have a lab appointment with the [facility] please come in thru the Main Entrance and check in at the main information desk.  You need to re-schedule your appointment should you arrive 10 or more minutes late.  We strive to give you quality  time with our providers, and arriving late affects you and other patients whose appointments are after yours.  Also, if you no show three or more times for appointments you may be dismissed from the clinic at the providers discretion.     Again, thank you for choosing [facility].  Our hope is that these requests will decrease the amount of time that you wait before being seen by our physicians.       _____________________________________________________________  Should you have questions after your visit to [facility], please contact our office at [phone] and follow the prompts.  Our office hours are 8:00 a.m. and 4:30 p.m. Monday - Friday.  Please note that voicemails left after 4:00 p.m. may not be returned until the following business day.  We are closed weekends and major holidays.  You do have access to a nurse 24-7, just call the main number to the clinic [phone] and do not press any options, hold on the line and a nurse will answer the phone.    For prescription refill requests, have your pharmacy contact our office and allow 72 hours.    Due to Covid, you will need to wear a mask upon entering the hospital. If you do not have a mask, a mask will be given to you at the Main Entrance upon arrival. For doctor visits, patients may have 1 support person age 30 or older with them. For treatment visits, patients can not have anyone with them due to social distancing guidelines and our immunocompromised population.

## 2021-02-09 LAB — LUTEINIZING HORMONE: LH: 39.9 m[IU]/mL

## 2021-02-09 LAB — FOLLICLE STIMULATING HORMONE: FSH: 95.3 m[IU]/mL

## 2021-02-10 LAB — ESTROGENS, TOTAL: Estrogen: 48 pg/mL (ref 40–244)

## 2021-02-13 ENCOUNTER — Encounter (HOSPITAL_COMMUNITY): Payer: Self-pay

## 2021-02-13 ENCOUNTER — Other Ambulatory Visit (HOSPITAL_COMMUNITY): Payer: BC Managed Care – PPO

## 2021-02-16 DIAGNOSIS — Z170 Estrogen receptor positive status [ER+]: Secondary | ICD-10-CM | POA: Diagnosis not present

## 2021-02-16 DIAGNOSIS — C50811 Malignant neoplasm of overlapping sites of right female breast: Secondary | ICD-10-CM | POA: Diagnosis not present

## 2021-02-20 ENCOUNTER — Encounter (HOSPITAL_COMMUNITY): Payer: Self-pay

## 2021-02-21 ENCOUNTER — Encounter (HOSPITAL_COMMUNITY): Payer: Self-pay

## 2021-02-22 NOTE — Progress Notes (Signed)
[facility] [address], [place] [address]   Patient Care Team: Sharilyn Sites, MD as PCP - General (Family Medicine) Derek Jack, MD as Medical Oncologist (Medical Oncology)  SUMMARY OF ONCOLOGIC HISTORY: Oncology History  Malignant neoplasm of overlapping sites of right female breast [facility])  02/16/2021 Genetic Testing   Oncotype DX:      CHIEF COMPLIANT: Follow-up of right breast cancer and history of left breast DCIS   INTERVAL HISTORY: [name] is a 55 y.o. female here today for follow up of her right breast cancer and history of left breast DCIS. Her last visit was on 02/08/2021.   Today she reports feeling good. She asks for time to consider proceeding with radiation therapy.   REVIEW OF SYSTEMS:   Review of Systems  Constitutional:  Negative for appetite change and fatigue.  Neurological:  Positive for numbness (R hand).  All other systems reviewed and are negative.  I have reviewed the past medical history, past surgical history, social history and family history with the patient and they are unchanged from previous note.   ALLERGIES:   is allergic to cortisone.   MEDICATIONS:  Current Outpatient Medications  Medication Sig Dispense Refill   ALPRAZolam (XANAX) 0.5 MG tablet Take 0.5 mg by mouth daily as needed for anxiety. (Patient not taking: Reported on 02/08/2021)     HYDROcodone-acetaminophen (NORCO/VICODIN) 5-325 MG tablet Take 1 tablet by mouth every 6 (six) hours as needed for moderate pain or severe pain. (Patient not taking: Reported on 02/08/2021) 15 tablet 0   Multiple Vitamins-Minerals (WOMENS MULTIVITAMIN PO) Take 1 tablet by mouth daily.     No current facility-administered medications for this visit.     PHYSICAL EXAMINATION: Performance status (ECOG): 1 - Symptomatic but completely ambulatory  There were no vitals filed for this visit. Wt Readings from Last 3 Encounters:  02/08/21 163 lb (73.9 kg)   01/26/21 158 lb 8.2 oz (71.9 kg)  01/10/21 156 lb (70.8 kg)   Physical Exam Vitals reviewed.  Constitutional:      Appearance: Normal appearance.  Cardiovascular:     Rate and Rhythm: Normal rate and regular rhythm.     Pulses: Normal pulses.     Heart sounds: Normal heart sounds.  Pulmonary:     Effort: Pulmonary effort is normal.     Breath sounds: Normal breath sounds.  Neurological:     General: No focal deficit present.     Mental Status: She is alert and oriented to person, place, and time.  Psychiatric:        Mood and Affect: Mood normal.        Behavior: Behavior normal.    Breast Exam Chaperone: Thana Ates     LABORATORY DATA:  I have reviewed the data as listed CMP Latest Ref Rng & Units 12/28/2020 11/30/2019 05/18/2019  Glucose 70 - 99 mg/dL 91 105(H) 107(H)  BUN 6 - 20 mg/dL 8 20 26(H)  Creatinine 0.44 - 1.00 mg/dL 0.62 0.68 0.74  Sodium 135 - 145 mmol/L 140 140 142  Potassium 3.5 - 5.1 mmol/L 3.8 4.0 4.8  Chloride 98 - 111 mmol/L 105 104 109  CO2 22 - 32 mmol/L $RemoveB'28 26 27  'HoLJousQ$ Calcium 8.9 - 10.3 mg/dL 9.2 9.6 9.4  Total Protein 6.5 - 8.1 g/dL 7.3 7.5 7.6  Total Bilirubin 0.3 - 1.2 mg/dL 0.6 0.8 0.6  Alkaline Phos 38 - 126 U/L 57 51 57  AST 15 - 41  U/L '30 24 28  '$ ALT 0 - 44 U/L $Remo'26 18 21   'vyCrg$ No results found for: KDX833 Lab Results  Component Value Date   WBC 3.5 (L) 12/28/2020   HGB 11.3 (L) 12/28/2020   HCT 36.3 12/28/2020   MCV 90.5 12/28/2020   PLT 220 12/28/2020   NEUTROABS 1.9 12/28/2020    ASSESSMENT:  Stage I (PT1BN0) right breast invasive ductal carcinoma: - Mammogram on 11/30/2020 was BI-RADS 0 in the right breast. - Right diagnostic mammogram on 12/22/2020 with mass in 9 o'clock position in the right breast. - Right breast 9:00 biopsy on 01/03/2021 with infiltrating ductal carcinoma, grade 1/2. - Right lumpectomy and SLNB on 01/26/2021 by Dr. Marlou Starks. - Pathology with 0.9 cm grade 1 IDC, margins negative, 0/8 lymph nodes positive, ER 50%, PR negative,  HER2 2+ by IHC, HER2 negative by FISH.  Ki-67 was 1%.   History of left breast DCIS: - Lumpectomy on 10/22/2008, followed by XRT completed on 01/21/2009. - DCIS was intermediate grade, 2.7 cm, close to the chest wall, ER positive, PR negative.  2 mm of negative margins. - She refused antiestrogen therapy.  3.  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  Menarche-13, menopause unknown as she had uterine ablation (2010 secondary to heavy cycles) and unilateral salpingo-oophorectomy.  FCB-25y.  She took OCP for 4 years.  No HRT.  Used vaginal estrogen cream for 8 months. - She is a non-smoker. - Maternal aunt died of colon cancer at age 64.  No family history of breast cancer.   PLAN:  Stage I (PT1BN0) right breast invasive ductal carcinoma, ER 50% positive, PR negative, HER2 negative: - We reviewed Oncotype DX results which showed recurrence score 19.  Distant recurrence risk at 9 years was 6%.  Chemotherapy benefit was less than 1%. - I reviewed her [place] and estrogen levels which indicate postmenopausal state. - We talked about initiating her on aromatase inhibitor anastrozole 1 mg daily for at least 5 years. - We discussed side effects in detail including but not limited to hot flashes, musculoskeletal symptoms, decreased bone mineral density among others. - We have sent a prescription to her pharmacy. - We have made a referral to radiation oncology.  She reportedly had bad skin reaction when she had radiation for left breast DCIS.  She is reluctant to consider it.  I have encouraged her to talk to Dr. Lynnette Caffey. - Because of her history of DCIS and invasive ductal carcinoma, recommended genetic testing. - RTC 3 months for follow-up with labs and exam.  2.  Bone health: - She has rescheduled DEXA scan. - Vitamin D levels on 12/28/2020 at 31.36. - She was encouraged to take calcium and vitamin D supplements.  3.  Iron deficiency anemia: - She had a long  history of iron deficiency anemia.  She required parenteral iron in 2017. - She cannot tolerate iron pills due to constipation. - Last labs showed ferritin 46 and hemoglobin 11.3.  Will check CBC at next visit.  Breast Cancer therapy associated bone loss: I have recommended calcium, Vitamin D and weight bearing exercises.  Orders placed this encounter:  No orders of the defined types were placed in this encounter.   The patient has a good understanding of the overall plan. She agrees with it. She will call with any problems that may develop before the next visit here.  Derek Jack, MD [place] [phone]  I, Thana Ates, am acting as a Education administrator for Dr. Derek Jack.  I, Derek Jack MD, have reviewed the above documentation for accuracy and completeness, and I agree with the above.

## 2021-02-23 ENCOUNTER — Inpatient Hospital Stay (HOSPITAL_BASED_OUTPATIENT_CLINIC_OR_DEPARTMENT_OTHER): Payer: BC Managed Care – PPO | Admitting: Hematology

## 2021-02-23 ENCOUNTER — Other Ambulatory Visit: Payer: Self-pay

## 2021-02-23 VITALS — BP 124/84 | HR 98 | Temp 97.5°F | Resp 18 | Wt 164.4 lb

## 2021-02-23 DIAGNOSIS — D500 Iron deficiency anemia secondary to blood loss (chronic): Secondary | ICD-10-CM

## 2021-02-23 DIAGNOSIS — Z170 Estrogen receptor positive status [ER+]: Secondary | ICD-10-CM | POA: Diagnosis not present

## 2021-02-23 DIAGNOSIS — C50811 Malignant neoplasm of overlapping sites of right female breast: Secondary | ICD-10-CM

## 2021-02-23 DIAGNOSIS — D509 Iron deficiency anemia, unspecified: Secondary | ICD-10-CM | POA: Diagnosis not present

## 2021-02-23 DIAGNOSIS — D0512 Intraductal carcinoma in situ of left breast: Secondary | ICD-10-CM | POA: Diagnosis not present

## 2021-02-23 MED ORDER — ANASTROZOLE 1 MG PO TABS: 1.0000 mg | ORAL_TABLET | Freq: Every day | ORAL | 3 refills | Status: DC

## 2021-02-24 ENCOUNTER — Encounter (HOSPITAL_COMMUNITY): Payer: Self-pay | Admitting: Lab

## 2021-02-24 NOTE — Progress Notes (Unsigned)
Referral sent to [facility].  Records faxed on 10/28

## 2021-03-02 DIAGNOSIS — F419 Anxiety disorder, unspecified: Secondary | ICD-10-CM | POA: Diagnosis not present

## 2021-03-09 ENCOUNTER — Ambulatory Visit (HOSPITAL_BASED_OUTPATIENT_CLINIC_OR_DEPARTMENT_OTHER): Payer: BC Managed Care – PPO | Admitting: Genetic Counselor

## 2021-03-09 ENCOUNTER — Other Ambulatory Visit (HOSPITAL_COMMUNITY): Payer: Self-pay

## 2021-03-09 DIAGNOSIS — Z90722 Acquired absence of ovaries, bilateral: Secondary | ICD-10-CM | POA: Diagnosis not present

## 2021-03-09 DIAGNOSIS — Z87891 Personal history of nicotine dependence: Secondary | ICD-10-CM | POA: Diagnosis not present

## 2021-03-09 DIAGNOSIS — D0512 Intraductal carcinoma in situ of left breast: Secondary | ICD-10-CM | POA: Diagnosis not present

## 2021-03-09 DIAGNOSIS — F1090 Alcohol use, unspecified, uncomplicated: Secondary | ICD-10-CM | POA: Diagnosis not present

## 2021-03-09 DIAGNOSIS — C50811 Malignant neoplasm of overlapping sites of right female breast: Secondary | ICD-10-CM

## 2021-03-09 DIAGNOSIS — Z923 Personal history of irradiation: Secondary | ICD-10-CM | POA: Diagnosis not present

## 2021-03-09 DIAGNOSIS — Z90710 Acquired absence of both cervix and uterus: Secondary | ICD-10-CM | POA: Diagnosis not present

## 2021-03-09 DIAGNOSIS — Z170 Estrogen receptor positive status [ER+]: Secondary | ICD-10-CM | POA: Diagnosis not present

## 2021-03-09 DIAGNOSIS — F419 Anxiety disorder, unspecified: Secondary | ICD-10-CM | POA: Diagnosis not present

## 2021-03-09 DIAGNOSIS — Z9049 Acquired absence of other specified parts of digestive tract: Secondary | ICD-10-CM | POA: Diagnosis not present

## 2021-03-09 DIAGNOSIS — Z79811 Long term (current) use of aromatase inhibitors: Secondary | ICD-10-CM | POA: Diagnosis not present

## 2021-03-09 MED ORDER — LETROZOLE 2.5 MG PO TABS: 2.5000 mg | ORAL_TABLET | Freq: Every day | ORAL | 3 refills | Status: DC

## 2021-03-09 NOTE — Telephone Encounter (Signed)
Call received from [place], [place]. She informs me that patient is currently in their clinic and tells them that she has discontinued anastrozole use due to ankle swelling and dizziness. Patient reports very similar issues with previous birth control trials when she was younger. Dr. Delton Coombes made aware. Patient prescribed Letrozole 2.5mg  once daily. Patient given my phone number to call with questions or concerns.

## 2021-03-09 NOTE — Progress Notes (Signed)
REFERRING PROVIDER: Derek Jack, MD [address] [place],  [place] [address]  PRIMARY PROVIDER:  Sharilyn Sites, MD  PRIMARY REASON FOR VISIT:  Encounter Diagnoses  Name Primary?   Ductal carcinoma in situ (DCIS) of left breast Yes   Malignant neoplasm of overlapping sites of right female breast, unspecified estrogen receptor status ([place])     I connected with [name] on 03/09/2021 at 2:30 PM EDT by Webex video conference and verified that I am speaking with the correct person using two identifiers.   Patient location: [place], [place] Provider location: [place]:   [name], a 55 y.o. female, was seen for a [facility] cancer genetics consultation at the request of Dr. Delton Coombes due to a personal and family history of cancer.  [name] presents to clinic today to discuss the possibility of a hereditary predisposition to cancer, to discuss genetic testing, and to further clarify her future cancer risks, as well as potential cancer risks for family members.   In 2010, [name] was diagnosed with DCIS of the left breast. In 2022, [name] was diagnosed with IDC of the right breast.   CANCER HISTORY:  Oncology History  Malignant neoplasm of overlapping sites of right female breast ([place])  02/16/2021 Genetic Testing   Oncotype DX:       Past Medical History:  Diagnosis Date   Breast CA ([place]) 09/07/2011   Breast cancer ([place]) 09/2008   DCIS   Breast cancer ([place]) 11/28/2020   Right Breast   Cancer ([place])    lt breast ca 2010/surg-lumpectomy/rad tx   Endometriosis    H/O bilateral oophorectomy 03/19/2014   Iron deficiency anemia    Iron deficiency anemia 09/07/2011   PONV (postoperative nausea and vomiting)     Past Surgical History:  Procedure Laterality Date   ABDOMINAL HYSTERECTOMY  2010   BILATERAL OOPHORECTOMY Bilateral 2010   BREAST LUMPECTOMY WITH RADIOACTIVE SEED AND SENTINEL LYMPH NODE BIOPSY Right  01/26/2021   Procedure: RIGHT BREAST LUMPECTOMY WITH RADIOACTIVE SEED AND SENTINEL LYMPH NODE BIOPSY;  Surgeon: Jovita Kussmaul, MD;  Location: [facility];  Service: General;  Laterality: Right;   CESAREAN SECTION     CHOLECYSTECTOMY  2005   lap choli   COLONOSCOPY N/A 05/12/2015   Procedure: COLONOSCOPY;  Surgeon: Rogene Houston, MD;  [facility];  Service: Endoscopy;  Laterality: N/A;  955 - moved to 1/12 @ 9:30   COLONOSCOPY N/A 08/04/2020   Procedure: COLONOSCOPY;  Surgeon: Rogene Houston, MD;  [facility];  Service: Endoscopy;  Laterality: N/A;  am   DIAGNOSTIC LAPAROSCOPY  2004   incisional mass   LAPAROSCOPY  2006   LOA   LIPOSUCTION WITH LIPOFILLING Left 05/13/2014   Procedure: LIPOSUCTION WITH LIPOFILLING FOR SYMMETRY;  Surgeon: Theodoro Kos, DO;  Location: [facility];  Service: Plastics;  Laterality: Left;  liposuction from abdomen, lipofilling to left breast   lumpectomy  2010   left lump-snbx   POLYPECTOMY  08/04/2020   Procedure: POLYPECTOMY;  Surgeon: Rogene Houston, MD;  [facility];  Service: Endoscopy;;    Social History   Socioeconomic History   Marital status: Married    Spouse name: Not on file   Number of children: Not on file   Years of education: Not on file   Highest education level: Not on file  Occupational History   Not on file  Tobacco Use   Smoking status: Former    Types: Cigarettes    Quit date: 05/06/1996    Years since quitting: 24.8   Smokeless tobacco: Never  Vaping Use   Vaping Use: Never used  Substance and Sexual Activity   Alcohol use: Yes    Alcohol/week: 2.0 standard drinks    Types: 2 Glasses of wine per week    Comment: red wine, glass a night   Drug use: No   Sexual activity: Yes  Other Topics Concern   Not on file  Social History Narrative   Not on file   Social Determinants of Health   Financial Resource Strain: Not on file  Food Insecurity: Not on file   Transportation Needs: Not on file  Physical Activity: Not on file  Stress: Not on file  Social Connections: Not on file     FAMILY HISTORY:  We obtained a detailed, 4-generation family history.  Significant diagnoses are listed below: Family History  Problem Relation Age of Onset   Colon cancer Maternal Aunt 98       possibly ovarian cancer instead of colon cancer     [name] is unaware of previous family history of genetic testing for hereditary cancer risks.   GENETIC COUNSELING ASSESSMENT: [name] is a 55 y.o. female with a personal and family history of cancer which is somewhat suggestive of a hereditary predisposition to cancer given her personal history of contralateral breast cancer. Dr. Delton Coombes ordered hereditary cancer genetic testing and we discussed the results during today's visit.   GENETIC TEST RESULTS:  The [facility] CancerNext-Expanded Panel found no pathogenic mutations.   The CancerNext-Expanded gene panel offered by [facility] and includes sequencing, rearrangement, and RNA analysis for the following 77 genes: AIP, ALK, APC, ATM, AXIN2, BAP1, BARD1, BLM, BMPR1A, BRCA1, BRCA2, BRIP1, CDC73, CDH1, CDK4, CDKN1B, CDKN2A, CHEK2, CTNNA1, DICER1, FANCC, FH, FLCN, GALNT12, KIF1B, LZTR1, MAX, MEN1, MET, MLH1, MSH2, MSH3, MSH6, MUTYH, NBN, NF1, NF2, NTHL1, PALB2, PHOX2B, PMS2, POT1, PRKAR1A, PTCH1, PTEN, RAD51C, RAD51D, RB1, RECQL, RET, SDHA, SDHAF2, SDHB, SDHC, SDHD, SMAD4, SMARCA4, SMARCB1, SMARCE1, STK11, SUFU, TMEM127, TP53, TSC1, TSC2, VHL and XRCC2 (sequencing and deletion/duplication); EGFR, EGLN1, HOXB13, KIT, MITF, PDGFRA, POLD1, and POLE (sequencing only); EPCAM and GREM1 (deletion/duplication only).   The test report has been scanned into EPIC and is located under the Molecular Pathology section of the Results Review tab.  A portion of the result report is included below for reference. Genetic testing reported out on 02/21/2021.     Even though a pathogenic  variant was not identified, possible explanations for the cancer in the family may include: There may be no hereditary risk for cancer in the family. The cancers in [name] and/or her family may be due to other genetic or environmental factors. There may be a gene mutation in one of these genes that current testing methods cannot detect, but that chance is small. There could be another gene that has not yet been discovered, or that we have not yet tested, that is responsible for the cancer diagnoses in the family.  It is also possible there is a hereditary cause for the cancer in the family that [name] did not inherit.  Therefore, it is important to remain in touch with cancer genetics in the future so that we can continue to offer [name] the most up to date genetic testing.   ADDITIONAL GENETIC TESTING:  We discussed with [name] that her genetic testing  was fairly extensive.  If there are genes identified to increase cancer risk that can be analyzed in the future, we would be happy to discuss and coordinate this testing at that time.    CANCER SCREENING RECOMMENDATIONS:  [name] test result is considered negative (normal).  This means that we have not identified a hereditary cause for her personal and family history of cancer at this time.   An individual's cancer risk and medical management are not determined by genetic test results alone. Overall cancer risk assessment incorporates additional factors, including personal medical history, family history, and any available genetic information that may result in a personalized plan for cancer prevention and surveillance. Therefore, it is recommended she continue to follow the cancer management and screening guidelines provided by her oncology and primary healthcare provider.  FOLLOW-UP:  Cancer genetics is a rapidly advancing field and it is possible that new genetic tests will be appropriate for her and/or her family members in the  future. We encourage her to remain in contact with cancer genetics on an annual basis so we can update her personal and family histories and let her know of advances in cancer genetics that may benefit this family.   The patient was seen for a total of 10 minutes in video genetic counseling. The patient was seen alone.  Drs. Magrinat, Lindi Adie and/or Burr Medico were available to discuss this case as needed.   _______________________________________________________________________ For Office Staff:  Number of people involved in session: 1 Was an Intern/ student involved with case: no

## 2021-03-10 ENCOUNTER — Encounter (HOSPITAL_COMMUNITY): Payer: Self-pay | Admitting: Genetic Counselor

## 2021-03-13 ENCOUNTER — Encounter: Payer: Self-pay | Admitting: Genetic Counselor

## 2021-03-13 DIAGNOSIS — Z1379 Encounter for other screening for genetic and chromosomal anomalies: Secondary | ICD-10-CM | POA: Insufficient documentation

## 2021-05-09 DIAGNOSIS — Z01419 Encounter for gynecological examination (general) (routine) without abnormal findings: Secondary | ICD-10-CM | POA: Diagnosis not present

## 2021-06-08 ENCOUNTER — Inpatient Hospital Stay (HOSPITAL_COMMUNITY): Payer: BC Managed Care – PPO

## 2021-06-15 ENCOUNTER — Ambulatory Visit (HOSPITAL_COMMUNITY): Payer: BC Managed Care – PPO | Admitting: Hematology

## 2021-06-16 ENCOUNTER — Other Ambulatory Visit (HOSPITAL_COMMUNITY): Payer: Self-pay

## 2021-06-16 DIAGNOSIS — D0512 Intraductal carcinoma in situ of left breast: Secondary | ICD-10-CM

## 2021-06-19 ENCOUNTER — Inpatient Hospital Stay (HOSPITAL_COMMUNITY): Payer: BC Managed Care – PPO | Attending: Hematology

## 2021-06-26 ENCOUNTER — Inpatient Hospital Stay (HOSPITAL_COMMUNITY): Payer: BC Managed Care – PPO | Attending: Hematology

## 2021-06-26 ENCOUNTER — Ambulatory Visit (HOSPITAL_COMMUNITY): Payer: BC Managed Care – PPO | Admitting: Hematology

## 2021-06-26 DIAGNOSIS — E559 Vitamin D deficiency, unspecified: Secondary | ICD-10-CM | POA: Diagnosis not present

## 2021-06-26 DIAGNOSIS — D0512 Intraductal carcinoma in situ of left breast: Secondary | ICD-10-CM | POA: Insufficient documentation

## 2021-06-26 LAB — CBC WITH DIFFERENTIAL/PLATELET
Abs Immature Granulocytes: 0.01 10*3/uL (ref 0.00–0.07)
Basophils Absolute: 0 10*3/uL (ref 0.0–0.1)
Basophils Relative: 1 %
Eosinophils Absolute: 0.1 10*3/uL (ref 0.0–0.5)
Eosinophils Relative: 3 %
HCT: 37.2 % (ref 36.0–46.0)
Hemoglobin: 11.7 g/dL — ABNORMAL LOW (ref 12.0–15.0)
Immature Granulocytes: 0 %
Lymphocytes Relative: 35 %
Lymphs Abs: 1.7 10*3/uL (ref 0.7–4.0)
MCH: 28.3 pg (ref 26.0–34.0)
MCHC: 31.5 g/dL (ref 30.0–36.0)
MCV: 90.1 fL (ref 80.0–100.0)
Monocytes Absolute: 0.3 10*3/uL (ref 0.1–1.0)
Monocytes Relative: 6 %
Neutro Abs: 2.6 10*3/uL (ref 1.7–7.7)
Neutrophils Relative %: 55 %
Platelets: 228 10*3/uL (ref 150–400)
RBC: 4.13 MIL/uL (ref 3.87–5.11)
RDW: 12.5 % (ref 11.5–15.5)
WBC: 4.7 10*3/uL (ref 4.0–10.5)
nRBC: 0 % (ref 0.0–0.2)

## 2021-06-26 LAB — COMPREHENSIVE METABOLIC PANEL
ALT: 19 U/L (ref 0–44)
AST: 22 U/L (ref 15–41)
Albumin: 4.3 g/dL (ref 3.5–5.0)
Alkaline Phosphatase: 59 U/L (ref 38–126)
Anion gap: 8 (ref 5–15)
BUN: 18 mg/dL (ref 6–20)
CO2: 26 mmol/L (ref 22–32)
Calcium: 9.4 mg/dL (ref 8.9–10.3)
Chloride: 104 mmol/L (ref 98–111)
Creatinine, Ser: 0.76 mg/dL (ref 0.44–1.00)
GFR, Estimated: >60 mL/min (ref >60)
Glucose, Bld: 103 mg/dL — ABNORMAL HIGH (ref 70–99)
Potassium: 4 mmol/L (ref 3.5–5.1)
Sodium: 138 mmol/L (ref 135–145)
Total Bilirubin: 0.2 mg/dL — ABNORMAL LOW (ref 0.3–1.2)
Total Protein: 7.8 g/dL (ref 6.5–8.1)

## 2021-06-26 LAB — VITAMIN D 25 HYDROXY (VIT D DEFICIENCY, FRACTURES): Vit D, 25-Hydroxy: 34.04 ng/mL (ref 30–100)

## 2021-07-04 NOTE — Progress Notes (Signed)
Has follow up 07/12/21

## 2021-07-12 ENCOUNTER — Other Ambulatory Visit: Payer: Self-pay

## 2021-07-12 ENCOUNTER — Inpatient Hospital Stay (HOSPITAL_COMMUNITY): Payer: BC Managed Care – PPO | Attending: Hematology | Admitting: Hematology

## 2021-07-12 ENCOUNTER — Other Ambulatory Visit (HOSPITAL_COMMUNITY): Payer: Self-pay | Admitting: Hematology

## 2021-07-12 VITALS — BP 114/90 | HR 104 | Temp 96.9°F | Resp 17 | Ht 68.0 in | Wt 163.3 lb

## 2021-07-12 DIAGNOSIS — Z800 Family history of malignant neoplasm of digestive organs: Secondary | ICD-10-CM | POA: Insufficient documentation

## 2021-07-12 DIAGNOSIS — Z923 Personal history of irradiation: Secondary | ICD-10-CM | POA: Diagnosis not present

## 2021-07-12 DIAGNOSIS — D509 Iron deficiency anemia, unspecified: Secondary | ICD-10-CM | POA: Diagnosis not present

## 2021-07-12 DIAGNOSIS — D500 Iron deficiency anemia secondary to blood loss (chronic): Secondary | ICD-10-CM

## 2021-07-12 DIAGNOSIS — D0512 Intraductal carcinoma in situ of left breast: Secondary | ICD-10-CM

## 2021-07-12 DIAGNOSIS — Z170 Estrogen receptor positive status [ER+]: Secondary | ICD-10-CM | POA: Diagnosis not present

## 2021-07-12 DIAGNOSIS — C50811 Malignant neoplasm of overlapping sites of right female breast: Secondary | ICD-10-CM | POA: Diagnosis not present

## 2021-07-12 DIAGNOSIS — Z9889 Other specified postprocedural states: Secondary | ICD-10-CM

## 2021-07-12 MED ORDER — TAMOXIFEN CITRATE 10 MG PO TABS: 10.0000 mg | ORAL_TABLET | Freq: Every day | ORAL | 3 refills | Status: DC

## 2021-07-12 MED ORDER — PROCHLORPERAZINE MALEATE 10 MG PO TABS: 10.0000 mg | ORAL_TABLET | Freq: Four times a day (QID) | ORAL | 6 refills | Status: DC | PRN

## 2021-07-12 NOTE — Patient Instructions (Addendum)
[place] at [facility] ?Discharge Instructions ? ?You were seen and examined today by Dr. Delton Coombes. ? ?Because you are not on any anti-estrogen therapy and you did not undergo radiation therapy, there is no protection against cancer recurrence currently in place. ? ?Dr. Delton Coombes has recommended Tamoxifen. This is another anti-estrogen pill. Please take it at night. Dr. Delton Coombes will also prescribe anti-nausea medication for you to take as needed. If you are unable to tolerate the medication, please call the clinic at [phone]. ? ?Your next mammogram will be in August. ? ?Follow-up with Dr. Delton Coombes as scheduled. ? ? ?Thank you for choosing [place] at [facility] to provide your oncology and hematology care.  To afford each patient quality time with our provider, please arrive at least 15 minutes before your scheduled appointment time.  ? ?If you have a lab appointment with the [place] please come in thru the Main Entrance and check in at the main information desk. ? ?You need to re-schedule your appointment should you arrive 10 or more minutes late.  We strive to give you quality time with our providers, and arriving late affects you and other patients whose appointments are after yours.  Also, if you no show three or more times for appointments you may be dismissed from the clinic at the providers discretion.     ?Again, thank you for choosing [facility].  Our hope is that these requests will decrease the amount of time that you wait before being seen by our physicians.       ?_____________________________________________________________ ? ?Should you have questions after your visit to [facility], please contact our office at [phone] and follow the prompts.  Our office hours are 8:00 a.m. and 4:30 p.m. Monday - Friday.  Please note that voicemails left after 4:00 p.m. may not be returned until the following  business day.  We are closed weekends and major holidays.  You do have access to a nurse 24-7, just call the main number to the clinic [phone] and do not press any options, hold on the line and a nurse will answer the phone.   ? ?For prescription refill requests, have your pharmacy contact our office and allow 72 hours.   ? ?Due to Covid, you will need to wear a mask upon entering the hospital. If you do not have a mask, a mask will be given to you at the Main Entrance upon arrival. For doctor visits, patients may have 1 support person age 63 or older with them. For treatment visits, patients can not have anyone with them due to social distancing guidelines and our immunocompromised population.  ? ? ? ?

## 2021-07-12 NOTE — Progress Notes (Signed)
? ?[place] ?[address] ?[place], [place] [address] ? ? ?Patient Care Team: ?Sharilyn Sites, MD as PCP - General (Family Medicine) ?Derek Jack, MD as Medical Oncologist (Medical Oncology) ? ?SUMMARY OF ONCOLOGIC HISTORY: ?Oncology History  ?Ductal carcinoma in situ of breast  ?09/07/2011 Initial Diagnosis  ? Ductal carcinoma in situ of breast ?  ? Genetic Testing  ? The [facility] CancerNext-Expanded Panel was Negative. Report date is 02/22/2021. ? ?The CancerNext-Expanded gene panel offered by [facility] and includes sequencing, rearrangement, and RNA analysis for the following 77 genes: AIP, ALK, APC, ATM, AXIN2, BAP1, BARD1, BLM, BMPR1A, BRCA1, BRCA2, BRIP1, CDC73, CDH1, CDK4, CDKN1B, CDKN2A, CHEK2, CTNNA1, DICER1, FANCC, FH, FLCN, GALNT12, KIF1B, LZTR1, MAX, MEN1, MET, MLH1, MSH2, MSH3, MSH6, MUTYH, NBN, NF1, NF2, NTHL1, PALB2, PHOX2B, PMS2, POT1, PRKAR1A, PTCH1, PTEN, RAD51C, RAD51D, RB1, RECQL, RET, SDHA, SDHAF2, SDHB, SDHC, SDHD, SMAD4, SMARCA4, SMARCB1, SMARCE1, STK11, SUFU, TMEM127, TP53, TSC1, TSC2, VHL and XRCC2 (sequencing and deletion/duplication); EGFR, EGLN1, HOXB13, KIT, MITF, PDGFRA, POLD1, and POLE (sequencing only); EPCAM and GREM1 (deletion/duplication only).  ?  ?Malignant neoplasm of overlapping sites of right female breast [facility])  ?02/16/2021 Genetic Testing  ? Oncotype DX: ? ?  ? ? ?CHIEF COMPLIANT: Follow-up of right breast cancer and history of left breast DCIS ? ? ?INTERVAL HISTORY: [name] is a 56 y.o. female here today for follow up of her right breast cancer and history of left breast DCIS. Her last visit was on 02/23/2021.  ? ?Today she reports feeling good. She met with Dr. Lynnette Caffey and reports she has decided against radiation therapy. She took anastrozole for 2 weeks before stopping due to fatigue and nausea; she denies vomiting. She then tried letrozole before experiencing the same symptoms, and she stopped the pill after 2 weeks. She is taking  Calcium and Vitamin D. She reports she has recently changed her diet and reduced the amount of meat, sweets, and alcohol she is consuming. She reports tenderness around her bilateral lumpectomy scars, but she otherwise denies breast pain.  ? ?REVIEW OF SYSTEMS:   ?Review of Systems  ?Constitutional:  Negative for appetite change and fatigue.  ?All other systems reviewed and are negative. ? ?I have reviewed the past medical history, past surgical history, social history and family history with the patient and they are unchanged from previous note. ? ? ?ALLERGIES:   ?is allergic to cortisone. ? ? ?MEDICATIONS:  ?Current Outpatient Medications  ?Medication Sig Dispense Refill  ? ALPRAZolam (XANAX) 0.5 MG tablet Take 0.5 mg by mouth daily as needed for anxiety.    ? Multiple Vitamins-Minerals (WOMENS MULTIVITAMIN PO) Take 1 tablet by mouth daily.    ? ?No current facility-administered medications for this visit.  ? ? ? ?PHYSICAL EXAMINATION: ?Performance status (ECOG): 1 - Symptomatic but completely ambulatory ? ?Vitals:  ? 07/12/21 0818  ?BP: 114/90  ?Pulse: (!) 104  ?Resp: 17  ?Temp: (!) 96.9 ?F (36.1 ?C)  ?SpO2: 100%  ? ?Wt Readings from Last 3 Encounters:  ?07/12/21 163 lb 4.8 oz (74.1 kg)  ?02/23/21 164 lb 6.4 oz (74.6 kg)  ?02/08/21 163 lb (73.9 kg)  ? ?Physical Exam ?Vitals reviewed.  ?Constitutional:   ?   Appearance: Normal appearance.  ?Cardiovascular:  ?   Rate and Rhythm: Normal rate and regular rhythm.  ?   Pulses: Normal pulses.  ?   Heart sounds: Normal heart sounds.  ?Pulmonary:  ?   Effort: Pulmonary effort is normal.  ?  Breath sounds: Normal breath sounds.  ?Chest:  ?Breasts: ?   Right: No swelling, bleeding, inverted nipple, mass, nipple discharge, skin change (UOQ lumpectomy scar WNL) or tenderness.  ?   Left: No swelling, bleeding, inverted nipple, mass, nipple discharge, skin change (UIQ lumpectomy scar) or tenderness.  ?Abdominal:  ?   Palpations: Abdomen is soft. There is no hepatomegaly,  splenomegaly or mass.  ?   Tenderness: There is no abdominal tenderness.  ?Lymphadenopathy:  ?   Upper Body:  ?   Right upper body: No supraclavicular, axillary or pectoral adenopathy.  ?   Left upper body: No supraclavicular, axillary or pectoral adenopathy.  ?Neurological:  ?   General: No focal deficit present.  ?   Mental Status: She is alert and oriented to person, place, and time.  ?Psychiatric:     ?   Mood and Affect: Mood normal.     ?   Behavior: Behavior normal.  ? ? ?Breast Exam Chaperone: Thana Ates   ? ? ?LABORATORY DATA:  ?I have reviewed the data as listed ?CMP Latest Ref Rng & Units 06/26/2021 12/28/2020 11/30/2019  ?Glucose 70 - 99 mg/dL 103(H) 91 105(H)  ?BUN 6 - 20 mg/dL '18 8 20  ' ?Creatinine 0.44 - 1.00 mg/dL 0.76 0.62 0.68  ?Sodium 135 - 145 mmol/L 138 140 140  ?Potassium 3.5 - 5.1 mmol/L 4.0 3.8 4.0  ?Chloride 98 - 111 mmol/L 104 105 104  ?CO2 22 - 32 mmol/L '26 28 26  ' ?Calcium 8.9 - 10.3 mg/dL 9.4 9.2 9.6  ?Total Protein 6.5 - 8.1 g/dL 7.8 7.3 7.5  ?Total Bilirubin 0.3 - 1.2 mg/dL 0.2(L) 0.6 0.8  ?Alkaline Phos 38 - 126 U/L 59 57 51  ?AST 15 - 41 U/L '22 30 24  ' ?ALT 0 - 44 U/L '19 26 18  ' ? ?No results found for: [id number] ?Lab Results  ?Component Value Date  ? WBC 4.7 06/26/2021  ? HGB 11.7 (L) 06/26/2021  ? HCT 37.2 06/26/2021  ? MCV 90.1 06/26/2021  ? PLT 228 06/26/2021  ? NEUTROABS 2.6 06/26/2021  ? ? ?ASSESSMENT:  ?Stage I (PT1BN0) right breast invasive ductal carcinoma: ?- Mammogram on 11/30/2020 was BI-RADS 0 in the right breast. ?- Right diagnostic mammogram on 12/22/2020 with mass in 9 o'clock position in the right breast. ?- Right breast 9:00 biopsy on 01/03/2021 with infiltrating ductal carcinoma, grade 1/2. ?- Right lumpectomy and SLNB on 01/26/2021 by Dr. Marlou Starks. ?- Pathology with 0.9 cm grade 1 IDC, margins negative, 0/8 lymph nodes positive, ER 50%, PR negative, HER2 2+ by IHC, HER2 negative by FISH.  Ki-67 was 1%. ?- Oncotype DX recurrence score 19.  Distant recurrence risk at 9 years was 6%.   Chemotherapy benefit was less than 1%. ?- FSH and estradiol levels were indicated of postmenopausal state. ?- Anastrozole 1 mg started and was discontinued due to nausea and tiredness. ?- Letrozole was also discontinued due to nausea and tiredness. ?- She was evaluated by radiation oncology Dr. Lynnette Caffey and was offered short course of radiation, which she refused. ?  ?History of left breast DCIS: ?- Lumpectomy on 10/22/2008, followed by XRT completed on 01/21/2009. ?- DCIS was intermediate grade, 2.7 cm, close to the chest wall, ER positive, PR negative.  2 mm of negative margins. ?- She refused antiestrogen therapy. ? ?3.  Social/family history: ?- She works as a Freight forwarder in International aid/development worker.  She has 2 children.  Menarche-13, menopause unknown as she had uterine ablation (2010 secondary to  heavy cycles) and unilateral salpingo-oophorectomy.  FCB-25y.  She took OCP for 4 years.  No HRT.  Used vaginal estrogen cream for 8 months. ?- She is a non-smoker. ?- Maternal aunt died of colon cancer at age 74.  No family history of breast cancer. ? ? ?PLAN:  ?Stage I (PT1BN0) right breast invasive ductal carcinoma, ER 50% positive, PR negative, HER2 negative: ?- She was evaluated by radiation oncology Dr. Lynnette Caffey and refused treatment. ?- She stopped taking anastrozole after couple of weeks as she developed nausea and tiredness. ?- We have sent her letrozole which she could not tolerate due to same reasons and stopped taking it. ?- Today we talked about trying low-dose tamoxifen at 10 mg and see if she tolerates it.  We discussed side effects in detail. ?- She inquired about over-the-counter vitamins (Dr. Renae Gloss) to decrease her cancer risk.  I have told her that there is no evidence that it works. ?- I had a prolonged discussion with her about the importance of taking antiestrogen therapy given the fact that she refused radiation therapy. ?- Physical examination today shows right upper outer quadrant lumpectomy scar normal.  No palpable  masses in bilateral breast. ?- Recommend follow-up in 6 months with repeat labs and mammogram. ? ?2.  Bone health: ?- We have recommended DEXA scan which she rescheduled. ?- Vitamin D level is 34.  Continue calc

## 2021-08-16 ENCOUNTER — Encounter (HOSPITAL_COMMUNITY): Payer: Self-pay

## 2021-10-16 ENCOUNTER — Telehealth (HOSPITAL_COMMUNITY): Payer: Self-pay | Admitting: *Deleted

## 2021-10-16 NOTE — Telephone Encounter (Signed)
Patient called to express that she has been having 1/10 intermittent discomfort in her right breast.  Started a workout program last week and has been working her upper body.  Requested a mammogram.  Advised to make an appointment with PCP to evaluate, as this is likely the cause.  Made her aware that if this worsens or does not go away to make [place] aware.  Denies feeling any new lumps or masses at this time.

## 2021-10-18 ENCOUNTER — Other Ambulatory Visit (HOSPITAL_COMMUNITY): Payer: Self-pay | Admitting: Hematology

## 2021-10-18 ENCOUNTER — Other Ambulatory Visit (HOSPITAL_COMMUNITY): Payer: Self-pay | Admitting: *Deleted

## 2021-10-18 DIAGNOSIS — Z170 Estrogen receptor positive status [ER+]: Secondary | ICD-10-CM

## 2021-10-18 DIAGNOSIS — N644 Mastodynia: Secondary | ICD-10-CM

## 2021-10-18 NOTE — Telephone Encounter (Signed)
Will schedule as suggested.  Patient made aware.  States pain has subsided.

## 2021-10-19 ENCOUNTER — Ambulatory Visit (HOSPITAL_COMMUNITY)
Admission: RE | Admit: 2021-10-19 | Discharge: 2021-10-19 | Disposition: A | Discharge status: Home / Self Care | Payer: BC Managed Care – PPO | Source: Ambulatory Visit | Attending: Hematology | Admitting: Hematology

## 2021-10-19 DIAGNOSIS — N644 Mastodynia: Secondary | ICD-10-CM | POA: Diagnosis not present

## 2021-10-19 DIAGNOSIS — C50811 Malignant neoplasm of overlapping sites of right female breast: Secondary | ICD-10-CM | POA: Diagnosis not present

## 2021-10-19 DIAGNOSIS — Z170 Estrogen receptor positive status [ER+]: Secondary | ICD-10-CM | POA: Diagnosis not present

## 2021-10-19 DIAGNOSIS — Z853 Personal history of malignant neoplasm of breast: Secondary | ICD-10-CM | POA: Diagnosis not present

## 2021-10-23 ENCOUNTER — Inpatient Hospital Stay (HOSPITAL_COMMUNITY): Payer: BC Managed Care – PPO | Attending: Hematology | Admitting: Hematology

## 2021-11-06 DIAGNOSIS — F419 Anxiety disorder, unspecified: Secondary | ICD-10-CM | POA: Diagnosis not present

## 2021-11-06 DIAGNOSIS — E669 Obesity, unspecified: Secondary | ICD-10-CM | POA: Diagnosis not present

## 2021-12-05 ENCOUNTER — Ambulatory Visit (HOSPITAL_COMMUNITY)
Admission: RE | Admit: 2021-12-05 | Discharge: 2021-12-05 | Disposition: A | Discharge status: Home / Self Care | Payer: BC Managed Care – PPO | Source: Ambulatory Visit | Attending: Hematology | Admitting: Hematology

## 2021-12-05 ENCOUNTER — Inpatient Hospital Stay: Payer: BC Managed Care – PPO

## 2021-12-05 DIAGNOSIS — C50811 Malignant neoplasm of overlapping sites of right female breast: Secondary | ICD-10-CM | POA: Diagnosis not present

## 2021-12-05 DIAGNOSIS — D0512 Intraductal carcinoma in situ of left breast: Secondary | ICD-10-CM | POA: Insufficient documentation

## 2021-12-05 DIAGNOSIS — R922 Inconclusive mammogram: Secondary | ICD-10-CM | POA: Diagnosis not present

## 2021-12-05 DIAGNOSIS — Z9889 Other specified postprocedural states: Secondary | ICD-10-CM

## 2021-12-08 ENCOUNTER — Inpatient Hospital Stay: Payer: BC Managed Care – PPO | Attending: Hematology

## 2021-12-12 ENCOUNTER — Inpatient Hospital Stay: Payer: BC Managed Care – PPO | Admitting: Hematology

## 2022-04-03 IMAGING — MG MM DIGITAL SCREENING BILAT W/ TOMO AND CAD
8 series · 9 of 24 positions shown · non-contrast
Comparison: Previous exam(s).

CLINICAL DATA: Screening.

EXAM:
DIGITAL SCREENING BILATERAL MAMMOGRAM WITH TOMOSYNTHESIS AND CAD
TECHNIQUE: Bilateral screening digital craniocaudal and mediolateral oblique
mammograms were obtained. Bilateral screening digital breast
tomosynthesis was performed. The images were evaluated with
computer-aided detection.

[L MLO synth-2D]
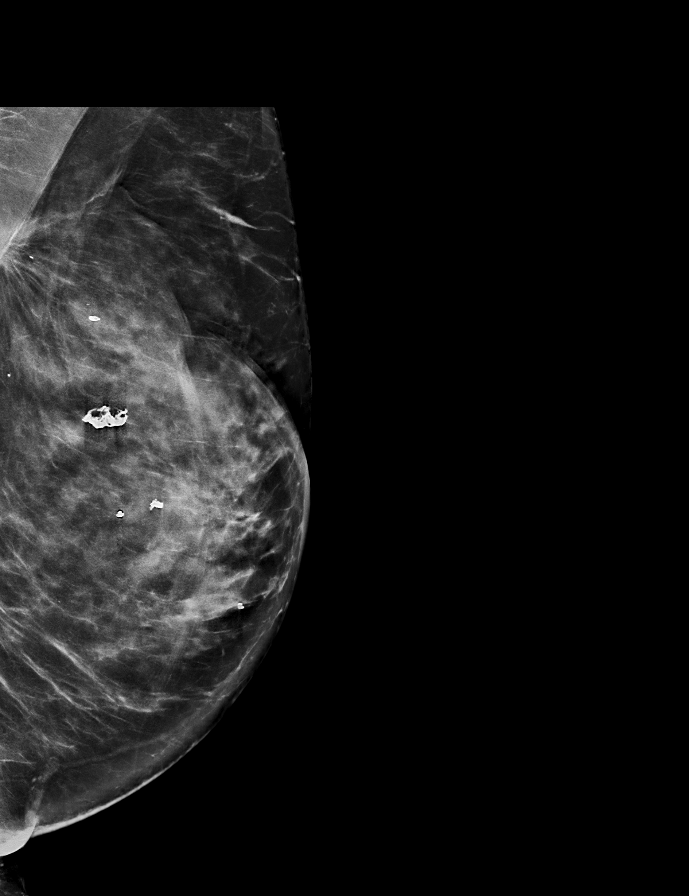

[R CC synth-2D]
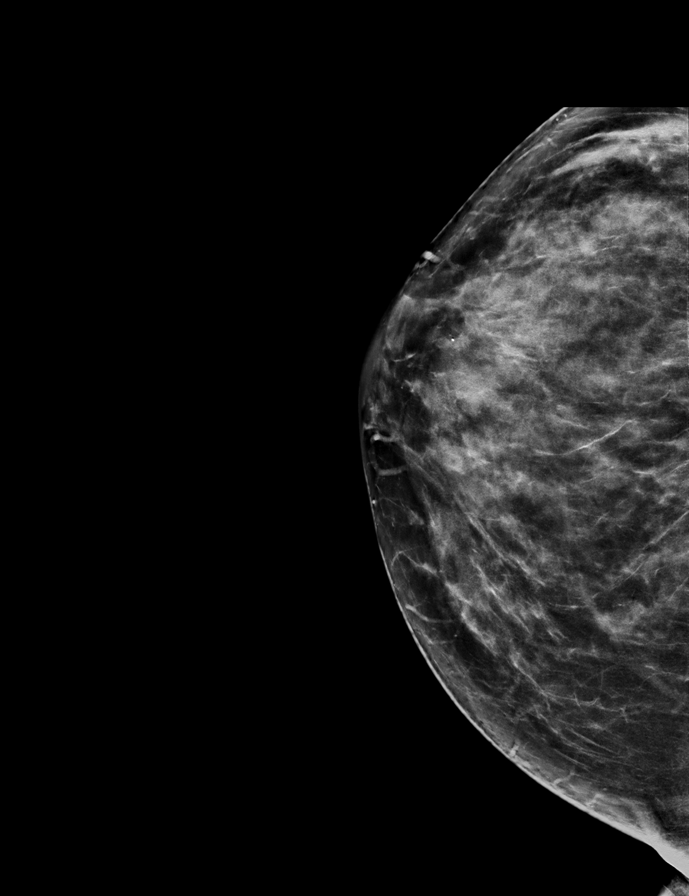

[R MLO synth-2D]
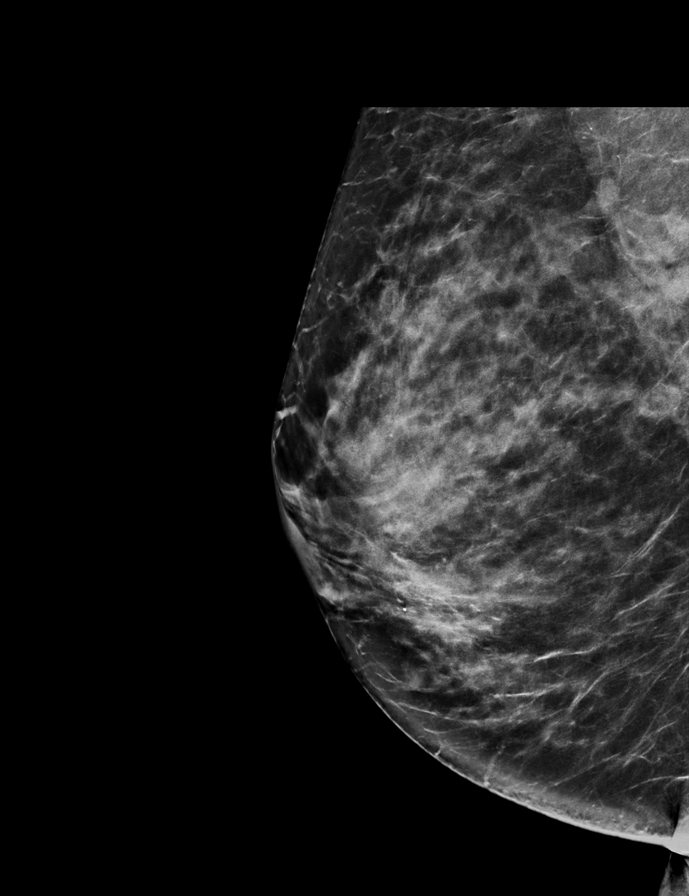

[L CC synth-2D]
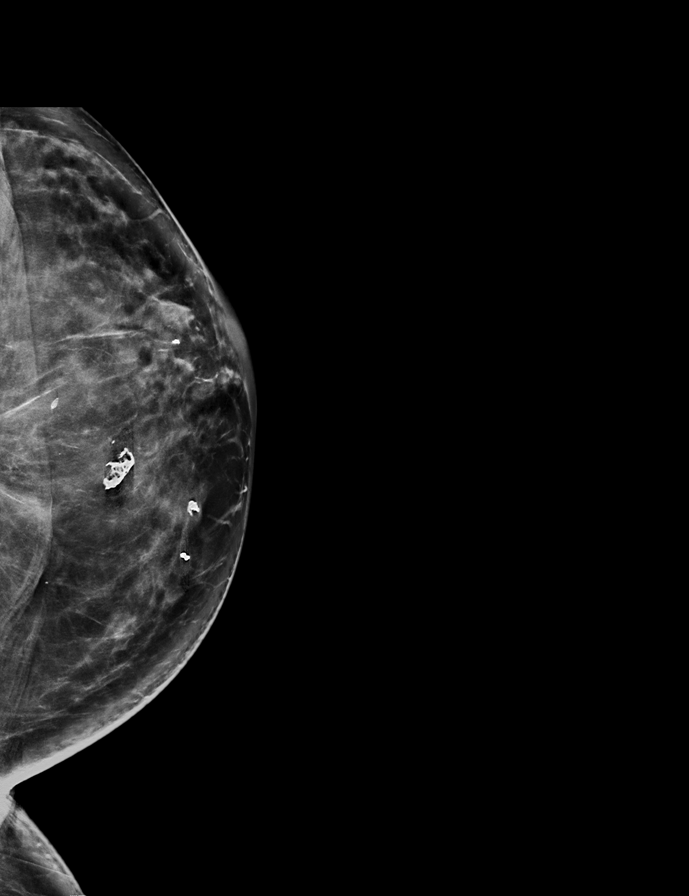

[L MLO tomo · 2 of 82 frames shown]
[frame 27/82]
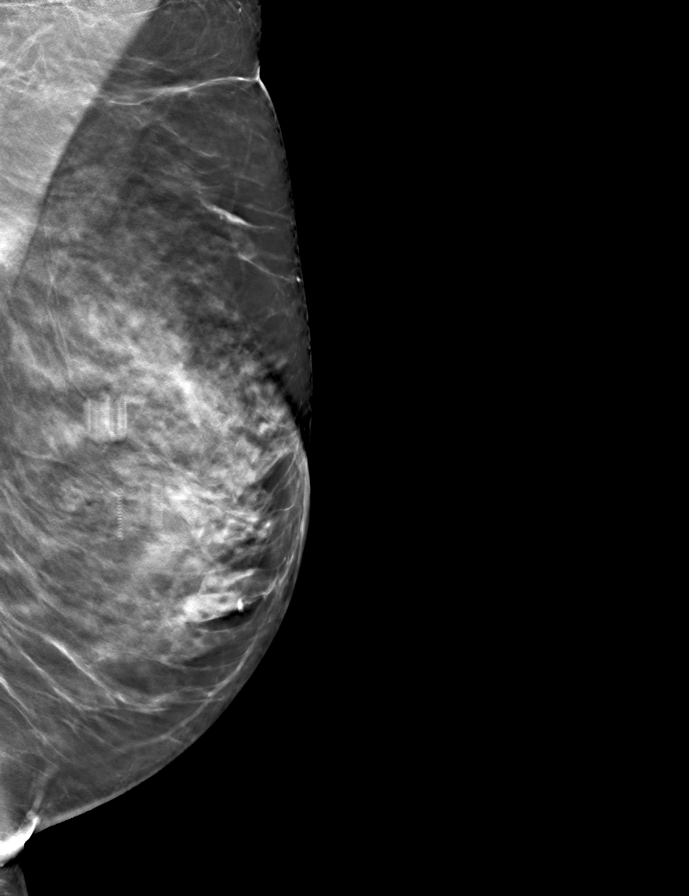
[frame 41/82]
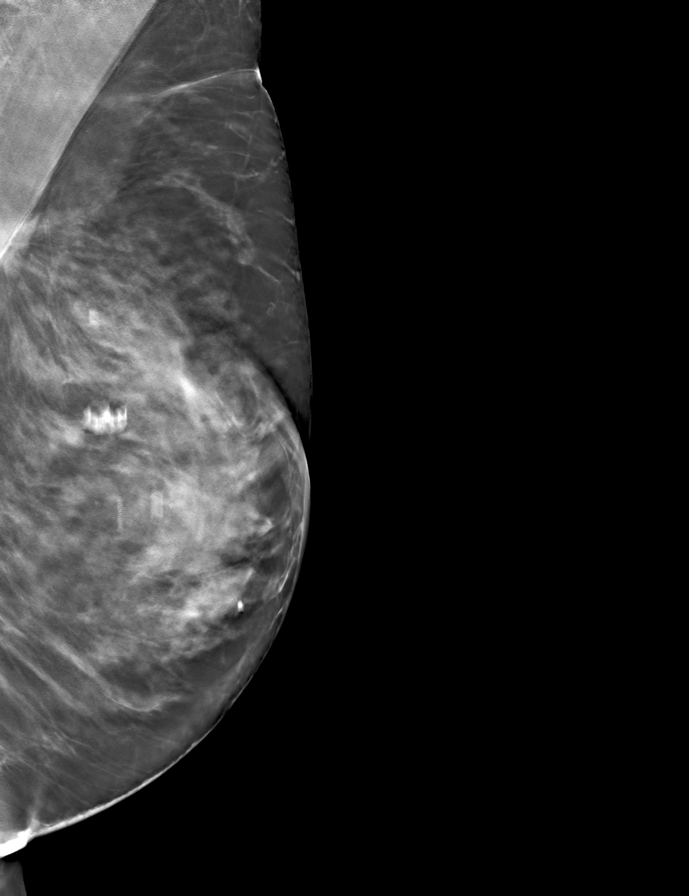

[L CC tomo · tomo slice 41/80.0]
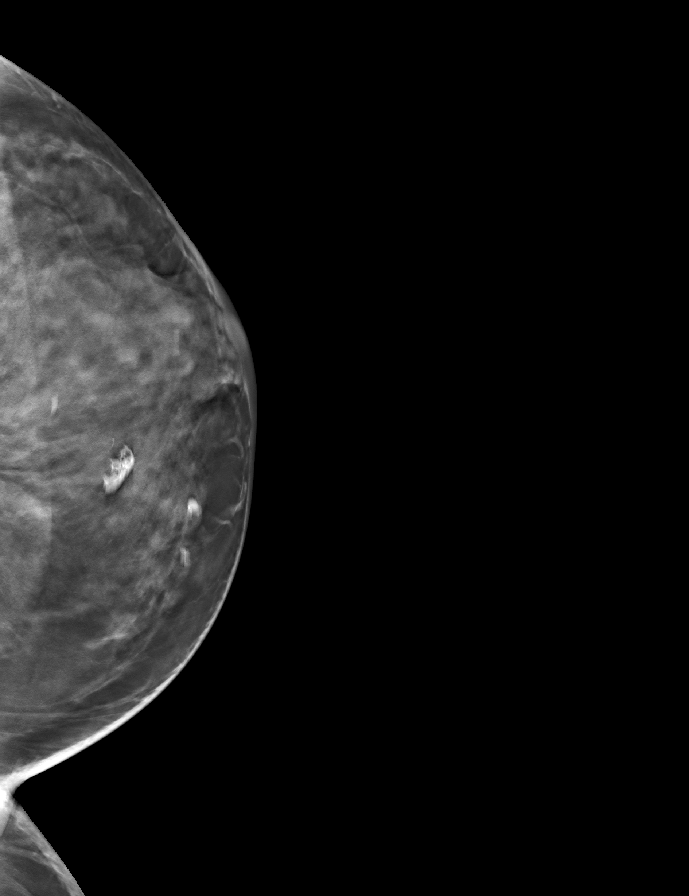

[R MLO tomo · tomo slice 39/77.0]
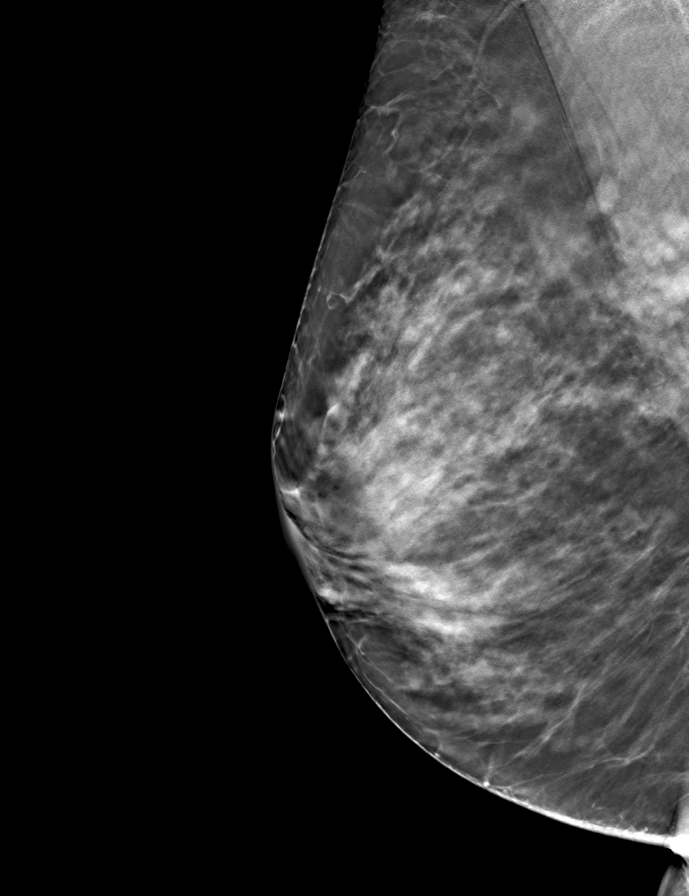

[R CC tomo · tomo slice 39/76.0]
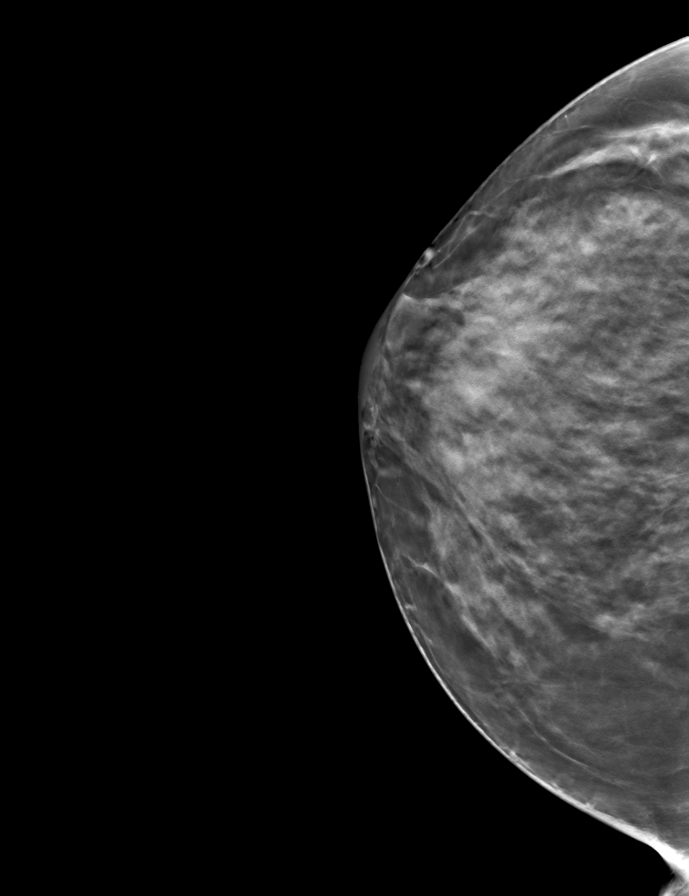

[9 of 24 positions shown; findings below may reference images not displayed]

ACR Breast Density Category c: The breast tissue is heterogeneously
dense, which may obscure small masses.
FINDINGS: In the right breast, a possible mass warrants further evaluation. In
the left breast, no findings suspicious for malignancy.
IMPRESSION: Further evaluation is suggested for a possible mass in the right
breast.

RECOMMENDATION:
Diagnostic mammogram and possibly ultrasound of the right breast.
(Code:82-G-AAO)

The patient will be contacted regarding the findings, and additional
imaging will be scheduled.

BI-RADS CATEGORY  0: Incomplete. Need additional imaging evaluation
and/or prior mammograms for comparison.

## 2022-05-10 DIAGNOSIS — Z6824 Body mass index (BMI) 24.0-24.9, adult: Secondary | ICD-10-CM | POA: Diagnosis not present

## 2022-05-10 DIAGNOSIS — Z01419 Encounter for gynecological examination (general) (routine) without abnormal findings: Secondary | ICD-10-CM | POA: Diagnosis not present

## 2022-06-14 DIAGNOSIS — E782 Mixed hyperlipidemia: Secondary | ICD-10-CM | POA: Diagnosis not present

## 2022-06-14 DIAGNOSIS — Z6824 Body mass index (BMI) 24.0-24.9, adult: Secondary | ICD-10-CM | POA: Diagnosis not present

## 2022-06-14 DIAGNOSIS — B352 Tinea manuum: Secondary | ICD-10-CM | POA: Diagnosis not present

## 2022-06-14 DIAGNOSIS — D539 Nutritional anemia, unspecified: Secondary | ICD-10-CM | POA: Diagnosis not present

## 2022-06-14 DIAGNOSIS — F418 Other specified anxiety disorders: Secondary | ICD-10-CM | POA: Diagnosis not present

## 2022-06-14 DIAGNOSIS — Z0001 Encounter for general adult medical examination with abnormal findings: Secondary | ICD-10-CM | POA: Diagnosis not present

## 2022-06-14 DIAGNOSIS — Z1331 Encounter for screening for depression: Secondary | ICD-10-CM | POA: Diagnosis not present

## 2022-06-28 DIAGNOSIS — N952 Postmenopausal atrophic vaginitis: Secondary | ICD-10-CM | POA: Diagnosis not present

## 2022-06-28 DIAGNOSIS — R6882 Decreased libido: Secondary | ICD-10-CM | POA: Diagnosis not present

## 2022-06-28 DIAGNOSIS — Z6824 Body mass index (BMI) 24.0-24.9, adult: Secondary | ICD-10-CM | POA: Diagnosis not present

## 2022-09-27 DIAGNOSIS — Z6823 Body mass index (BMI) 23.0-23.9, adult: Secondary | ICD-10-CM | POA: Diagnosis not present

## 2022-09-27 DIAGNOSIS — F418 Other specified anxiety disorders: Secondary | ICD-10-CM | POA: Diagnosis not present

## 2022-09-27 DIAGNOSIS — Z0001 Encounter for general adult medical examination with abnormal findings: Secondary | ICD-10-CM | POA: Diagnosis not present

## 2022-09-27 DIAGNOSIS — E782 Mixed hyperlipidemia: Secondary | ICD-10-CM | POA: Diagnosis not present

## 2022-09-27 DIAGNOSIS — Z1331 Encounter for screening for depression: Secondary | ICD-10-CM | POA: Diagnosis not present

## 2022-09-27 DIAGNOSIS — Z853 Personal history of malignant neoplasm of breast: Secondary | ICD-10-CM | POA: Diagnosis not present

## 2022-10-29 ENCOUNTER — Other Ambulatory Visit: Payer: Self-pay

## 2022-10-29 DIAGNOSIS — D0512 Intraductal carcinoma in situ of left breast: Secondary | ICD-10-CM

## 2022-10-30 ENCOUNTER — Inpatient Hospital Stay: Payer: BC Managed Care – PPO | Attending: Hematology

## 2022-10-30 DIAGNOSIS — Z853 Personal history of malignant neoplasm of breast: Secondary | ICD-10-CM | POA: Insufficient documentation

## 2022-10-30 DIAGNOSIS — D0512 Intraductal carcinoma in situ of left breast: Secondary | ICD-10-CM

## 2022-10-30 DIAGNOSIS — Z800 Family history of malignant neoplasm of digestive organs: Secondary | ICD-10-CM | POA: Insufficient documentation

## 2022-10-30 DIAGNOSIS — Z170 Estrogen receptor positive status [ER+]: Secondary | ICD-10-CM | POA: Diagnosis not present

## 2022-10-30 DIAGNOSIS — Z79811 Long term (current) use of aromatase inhibitors: Secondary | ICD-10-CM | POA: Insufficient documentation

## 2022-10-30 DIAGNOSIS — Z86000 Personal history of in-situ neoplasm of breast: Secondary | ICD-10-CM | POA: Diagnosis not present

## 2022-10-30 DIAGNOSIS — D509 Iron deficiency anemia, unspecified: Secondary | ICD-10-CM | POA: Insufficient documentation

## 2022-10-30 DIAGNOSIS — C50811 Malignant neoplasm of overlapping sites of right female breast: Secondary | ICD-10-CM | POA: Insufficient documentation

## 2022-10-30 DIAGNOSIS — Z90721 Acquired absence of ovaries, unilateral: Secondary | ICD-10-CM | POA: Insufficient documentation

## 2022-10-30 DIAGNOSIS — Z08 Encounter for follow-up examination after completed treatment for malignant neoplasm: Secondary | ICD-10-CM | POA: Insufficient documentation

## 2022-10-30 LAB — CBC WITH DIFFERENTIAL/PLATELET
Abs Immature Granulocytes: 0 10*3/uL (ref 0.00–0.07)
Basophils Absolute: 0 10*3/uL (ref 0.0–0.1)
Basophils Relative: 1 %
Eosinophils Absolute: 0.2 10*3/uL (ref 0.0–0.5)
Eosinophils Relative: 5 %
HCT: 35 % — ABNORMAL LOW (ref 36.0–46.0)
Hemoglobin: 10.9 g/dL — ABNORMAL LOW (ref 12.0–15.0)
Immature Granulocytes: 0 %
Lymphocytes Relative: 44 %
Lymphs Abs: 1.7 10*3/uL (ref 0.7–4.0)
MCH: 27.5 pg (ref 26.0–34.0)
MCHC: 31.1 g/dL (ref 30.0–36.0)
MCV: 88.4 fL (ref 80.0–100.0)
Monocytes Absolute: 0.3 10*3/uL (ref 0.1–1.0)
Monocytes Relative: 9 %
Neutro Abs: 1.6 10*3/uL — ABNORMAL LOW (ref 1.7–7.7)
Neutrophils Relative %: 41 %
Platelets: 237 10*3/uL (ref 150–400)
RBC: 3.96 MIL/uL (ref 3.87–5.11)
RDW: 13.5 % (ref 11.5–15.5)
WBC: 3.8 10*3/uL — ABNORMAL LOW (ref 4.0–10.5)
nRBC: 0 % (ref 0.0–0.2)

## 2022-10-30 LAB — VITAMIN D 25 HYDROXY (VIT D DEFICIENCY, FRACTURES): Vit D, 25-Hydroxy: 37.11 ng/mL (ref 30–100)

## 2022-10-30 LAB — COMPREHENSIVE METABOLIC PANEL
ALT: 15 U/L (ref 0–44)
AST: 21 U/L (ref 15–41)
Albumin: 3.9 g/dL (ref 3.5–5.0)
Alkaline Phosphatase: 52 U/L (ref 38–126)
Anion gap: 8 (ref 5–15)
BUN: 13 mg/dL (ref 6–20)
CO2: 27 mmol/L (ref 22–32)
Calcium: 9.1 mg/dL (ref 8.9–10.3)
Chloride: 106 mmol/L (ref 98–111)
Creatinine, Ser: 0.71 mg/dL (ref 0.44–1.00)
GFR, Estimated: >60 mL/min (ref >60)
Glucose, Bld: 100 mg/dL — ABNORMAL HIGH (ref 70–99)
Potassium: 3.5 mmol/L (ref 3.5–5.1)
Sodium: 141 mmol/L (ref 135–145)
Total Bilirubin: 0.4 mg/dL (ref 0.3–1.2)
Total Protein: 7.1 g/dL (ref 6.5–8.1)

## 2022-11-06 NOTE — Progress Notes (Signed)
[facility] [address] [place] [address]    Clinic Day:  11/06/2022  Referring physician: Assunta Found, MD  Patient Care Team: Assunta Found, MD as PCP - General (Family Medicine) Veronica Massed, MD as Medical Oncologist (Medical Oncology)   ASSESSMENT & PLAN:   Assessment: Stage I (PT1BN0) right breast invasive ductal carcinoma: - Mammogram on 11/30/2020 was BI-RADS 0 in the right breast. - Right diagnostic mammogram on 12/22/2020 with mass in 9 o'clock position in the right breast. - Right breast 9:00 biopsy on 01/03/2021 with infiltrating ductal carcinoma, grade 1/2. - Right lumpectomy and SLNB on 01/26/2021 by Dr. Carolynne Edouard. - Pathology with 0.9 cm grade 1 IDC, margins negative, 0/8 lymph nodes positive, ER 50%, PR negative, HER2 2+ by IHC, HER2 negative by FISH.  Ki-67 was 1%. - Oncotype DX recurrence score 19.  Distant recurrence risk at 9 years was 6%.  Chemotherapy benefit was less than 1%. - FSH and estradiol levels were indicated of postmenopausal state. - Anastrozole 1 mg started and was discontinued due to nausea and tiredness. - Letrozole was also discontinued due to nausea and tiredness. - She was evaluated by radiation oncology Dr. Langston Masker and was offered short course of radiation, which she refused.   History of left breast DCIS: - Lumpectomy on 10/22/2008, followed by XRT completed on 01/21/2009. - DCIS was intermediate grade, 2.7 cm, close to the chest wall, ER positive, PR negative.  2 mm of negative margins. - She refused antiestrogen therapy.  3.  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  She has 2 children.  Menarche-13, menopause unknown as she had uterine ablation (2010 secondary to heavy cycles) and unilateral salpingo-oophorectomy.  FCB-25y.  She took OCP for 4 years.  No HRT.  Used vaginal estrogen cream for 8 months. - She is a non-smoker. - Maternal aunt died of colon cancer at age 64.  No family history of breast  cancer.  Plan: Stage I (PT1BN0) right breast invasive ductal carcinoma, ER 50% positive, PR negative, HER2 negative: - She was evaluated by radiation oncology Dr. Langston Masker and refused treatment. - She stopped taking anastrozole after couple of weeks as she developed nausea and tiredness. - We have sent her letrozole which she could not tolerate due to same reasons and stopped taking it. - Today we talked about trying low-dose tamoxifen at 10 mg and see if she tolerates it.  We discussed side effects in detail. - She inquired about over-the-counter vitamins (Dr. Kyung Bacca) to decrease her cancer risk.  I have told her that there is no evidence that it works. - I had a prolonged discussion with her about the importance of taking antiestrogen therapy given the fact that she refused radiation therapy. - Physical examination today shows right upper outer quadrant lumpectomy scar normal.  No palpable masses in bilateral breast. - Recommend follow-up in 6 months with repeat labs and mammogram.  2.  Bone health: - We have recommended DEXA scan which she rescheduled. - Vitamin D level is 34.  Continue calcium and vitamin D supplements.  3.  Iron deficiency anemia: - She has a long history of iron deficiency and required parenteral iron therapy. - Hemoglobin today is 11.7.   Breast Cancer therapy associated bone loss: I have recommended calcium, Vitamin D and weight bearing exercises.  No orders of the defined types were placed in this encounter.     Alben Deeds Teague,acting as a Neurosurgeon for [employer],  MD.,have documented all relevant documentation on the behalf of Veronica Massed, MD,as directed by  Veronica Massed, MD while in the presence of Veronica Massed, MD.   ***  Superior R Teague   7/9/20249:24 PM  CHIEF COMPLAINT:   Diagnosis: right breast cancer and history of left breast DCIS   Cancer Staging  Ductal carcinoma in situ of breast Staging form: Breast, AJCC 7th  Edition - Clinical stage from 04/22/2015: Stage 0 (Tis (DCIS), N0, M0) - Signed by Ellouise Newer, PA-C on 04/22/2015  Malignant neoplasm of overlapping sites of right female breast [facility]) Staging form: Breast, AJCC 8th Edition - Clinical stage from 01/10/2021: Stage IA (cT1b, cN0, cM0, G2, ER+, PR-, HER2: Equivocal) - Unsigned    Prior Therapy: ***  Current Therapy:  none   HISTORY OF PRESENT ILLNESS:   Oncology History  Ductal carcinoma in situ of breast  09/07/2011 Initial Diagnosis   Ductal carcinoma in situ of breast    Genetic Testing   The [facility] CancerNext-Expanded Panel was Negative. Report date is 02/22/2021.  The CancerNext-Expanded gene panel offered by [facility] and includes sequencing, rearrangement, and RNA analysis for the following 77 genes: AIP, ALK, APC, ATM, AXIN2, BAP1, BARD1, BLM, BMPR1A, BRCA1, BRCA2, BRIP1, CDC73, CDH1, CDK4, CDKN1B, CDKN2A, CHEK2, CTNNA1, DICER1, FANCC, FH, FLCN, GALNT12, KIF1B, LZTR1, MAX, MEN1, MET, MLH1, MSH2, MSH3, MSH6, MUTYH, NBN, NF1, NF2, NTHL1, PALB2, PHOX2B, PMS2, POT1, PRKAR1A, PTCH1, PTEN, RAD51C, RAD51D, RB1, RECQL, RET, SDHA, SDHAF2, SDHB, SDHC, SDHD, SMAD4, SMARCA4, SMARCB1, SMARCE1, STK11, SUFU, TMEM127, TP53, TSC1, TSC2, VHL and XRCC2 (sequencing and deletion/duplication); EGFR, EGLN1, HOXB13, KIT, MITF, PDGFRA, POLD1, and POLE (sequencing only); EPCAM and GREM1 (deletion/duplication only).    Malignant neoplasm of overlapping sites of right female breast (HCC)  02/16/2021 Genetic Testing   Oncotype DX:       INTERVAL HISTORY:   Michaya is a 57 y.o. female presenting to clinic today for follow up of right breast cancer and history of left breast DCIS . She was last seen by me on 07/12/21.  Since her last visit, she had a diagnostic bilateral mammogram with tomosynthesis on 12/05/21 which found no evidence of new or recurrent breast carcinoma and benign post lumpectomy changes bilaterally.   Today, she states that she is  doing well overall. Her appetite level is at ***%. Her energy level is at ***%.  PAST MEDICAL HISTORY:   Past Medical History: Past Medical History:  Diagnosis Date   Breast CA (HCC) 09/07/2011   Breast cancer (HCC) 09/2008   DCIS   Breast cancer (HCC) 11/28/2020   Right Breast   Cancer (HCC)    lt breast ca 2010/surg-lumpectomy/rad tx   Endometriosis    H/O bilateral oophorectomy 03/19/2014   Iron deficiency anemia    Iron deficiency anemia 09/07/2011   PONV (postoperative nausea and vomiting)     Surgical History: Past Surgical History:  Procedure Laterality Date   ABDOMINAL HYSTERECTOMY  2010   BILATERAL OOPHORECTOMY Bilateral 2010   BREAST LUMPECTOMY WITH RADIOACTIVE SEED AND SENTINEL LYMPH NODE BIOPSY Right 01/26/2021   Procedure: RIGHT BREAST LUMPECTOMY WITH RADIOACTIVE SEED AND SENTINEL LYMPH NODE BIOPSY;  Surgeon: Griselda Miner, MD;  Location: [facility];  Service: General;  Laterality: Right;   CESAREAN SECTION     CHOLECYSTECTOMY  2005   lap choli   COLONOSCOPY N/A 05/12/2015   Procedure: COLONOSCOPY;  Surgeon: Malissa Hippo, MD;  [facility];  Service: Endoscopy;  Laterality: N/A;  955 - moved to 1/12 @ 9:30   COLONOSCOPY N/A 08/04/2020   Procedure: COLONOSCOPY;  Surgeon: Malissa Hippo, MD;  Location: [facility];  Service: Endoscopy;  Laterality: N/A;  am   DIAGNOSTIC LAPAROSCOPY  2004   incisional mass   LAPAROSCOPY  2006   LOA   LIPOSUCTION WITH LIPOFILLING Left 05/13/2014   Procedure: LIPOSUCTION WITH LIPOFILLING FOR SYMMETRY;  Surgeon: Wayland Denis, DO;  Location: [facility];  Service: Plastics;  Laterality: Left;  liposuction from abdomen, lipofilling to left breast   lumpectomy  2010   left lump-snbx   POLYPECTOMY  08/04/2020   Procedure: POLYPECTOMY;  Surgeon: Malissa Hippo, MD;  [facility]: [facility];  Service: Endoscopy;;    Social History: Social History   Socioeconomic History   Marital status:  Married    Spouse name: Not on file   Number of children: Not on file   Years of education: Not on file   Highest education level: Not on file  Occupational History   Not on file  Tobacco Use   Smoking status: Former    Types: Cigarettes    Quit date: 05/06/1996    Years since quitting: 26.5   Smokeless tobacco: Never  Vaping Use   Vaping Use: Never used  Substance and Sexual Activity   Alcohol use: Yes    Alcohol/week: 2.0 standard drinks of alcohol    Types: 2 Glasses of wine per week    Comment: red wine, glass a night   Drug use: No   Sexual activity: Yes  Other Topics Concern   Not on file  Social History Narrative   Not on file   Social Determinants of Health   Financial Resource Strain: Not on file  Food Insecurity: Not on file  Transportation Needs: Not on file  Physical Activity: Not on file  Stress: Not on file  Social Connections: Not on file  Intimate Partner Violence: Not At Risk (01/10/2021)   Humiliation, Afraid, Rape, and Kick questionnaire    Fear of Current or Ex-Partner: No    Emotionally Abused: No    Physically Abused: No    Sexually Abused: No    Family History: Family History  Problem Relation Age of Onset   Heart attack Father    Colon cancer Maternal Aunt 42       possibly ovarian cancer instead of colon cancer    Current Medications:  Current Outpatient Medications:    ALPRAZolam (XANAX) 0.5 MG tablet, Take 0.5 mg by mouth daily as needed for anxiety., Disp: , Rfl:    Multiple Vitamins-Minerals (WOMENS MULTIVITAMIN PO), Take 1 tablet by mouth daily., Disp: , Rfl:    prochlorperazine (COMPAZINE) 10 MG tablet, Take 1 tablet (10 mg total) by mouth every 6 (six) hours as needed for nausea or vomiting., Disp: 30 tablet, Rfl: 6   tamoxifen (NOLVADEX) 10 MG tablet, Take 1 tablet (10 mg total) by mouth daily., Disp: 90 tablet, Rfl: 3   Allergies: Allergies  Allergen Reactions   Cortisone Diarrhea and Other (See Comments)    Injection form:   "caused my chest to get tight; gave diarrhea really bad"     REVIEW OF SYSTEMS:   Review of Systems  Constitutional:  Negative for chills, fatigue and fever.  HENT:   Negative for lump/mass, mouth sores, nosebleeds, sore throat and trouble swallowing.   Eyes:  Negative for eye problems.  Respiratory:  Negative for cough and shortness of breath.  Cardiovascular:  Negative for chest pain, leg swelling and palpitations.  Gastrointestinal:  Negative for abdominal pain, constipation, diarrhea, nausea and vomiting.  Genitourinary:  Negative for bladder incontinence, difficulty urinating, dysuria, frequency, hematuria and nocturia.   Musculoskeletal:  Negative for arthralgias, back pain, flank pain, myalgias and neck pain.  Skin:  Negative for itching and rash.  Neurological:  Negative for dizziness, headaches and numbness.  Hematological:  Does not bruise/bleed easily.  Psychiatric/Behavioral:  Negative for depression, sleep disturbance and suicidal ideas. The patient is not nervous/anxious.   All other systems reviewed and are negative.    VITALS:   There were no vitals taken for this visit.  Wt Readings from Last 3 Encounters:  07/12/21 163 lb 4.8 oz (74.1 kg)  02/23/21 164 lb 6.4 oz (74.6 kg)  02/08/21 163 lb (73.9 kg)    There is no height or weight on file to calculate BMI.  Performance status (ECOG): {CHL ONC [id number]  PHYSICAL EXAM:   Physical Exam Vitals and nursing note reviewed. Exam conducted with a chaperone present.  Constitutional:      Appearance: Normal appearance.  Cardiovascular:     Rate and Rhythm: Normal rate and regular rhythm.     Pulses: Normal pulses.     Heart sounds: Normal heart sounds.  Pulmonary:     Effort: Pulmonary effort is normal.     Breath sounds: Normal breath sounds.  Abdominal:     Palpations: Abdomen is soft. There is no hepatomegaly, splenomegaly or mass.     Tenderness: There is no abdominal tenderness.  Musculoskeletal:      Right lower leg: No edema.     Left lower leg: No edema.  Lymphadenopathy:     Cervical: No cervical adenopathy.     Right cervical: No superficial, deep or posterior cervical adenopathy.    Left cervical: No superficial, deep or posterior cervical adenopathy.     Upper Body:     Right upper body: No supraclavicular or axillary adenopathy.     Left upper body: No supraclavicular or axillary adenopathy.  Neurological:     General: No focal deficit present.     Mental Status: She is alert and oriented to person, place, and time.  Psychiatric:        Mood and Affect: Mood normal.        Behavior: Behavior normal.     LABS:      Latest Ref Rng & Units 10/30/2022    7:56 AM 06/26/2021    4:05 PM 12/28/2020    9:08 AM  CBC  WBC 4.0 - 10.5 K/uL 3.8  4.7  3.5   Hemoglobin 12.0 - 15.0 g/dL 16.1  09.6  04.5   Hematocrit 36.0 - 46.0 % 35.0  37.2  36.3   Platelets 150 - 400 K/uL 237  228  220       Latest Ref Rng & Units 10/30/2022    7:56 AM 06/26/2021    4:05 PM 12/28/2020    9:08 AM  CMP  Glucose 70 - 99 mg/dL 409  811  91   BUN 6 - 20 mg/dL 13  18  8    Creatinine 0.44 - 1.00 mg/dL 9.14  7.82  9.56   Sodium 135 - 145 mmol/L 141  138  140   Potassium 3.5 - 5.1 mmol/L 3.5  4.0  3.8   Chloride 98 - 111 mmol/L 106  104  105   CO2 22 - 32 mmol/L 27  26  28   Calcium 8.9 - 10.3 mg/dL 9.1  9.4  9.2   Total Protein 6.5 - 8.1 g/dL 7.1  7.8  7.3   Total Bilirubin 0.3 - 1.2 mg/dL 0.4  0.2  0.6   Alkaline Phos 38 - 126 U/L 52  59  57   AST 15 - 41 U/L 21  22  30    ALT 0 - 44 U/L 15  19  26       No results found for: "CEA1", "CEA" / No results found for: "CEA1", "CEA" No results found for: "PSA1" No results found for: "ZOX096" No results found for: "CAN125"  No results found for: "TOTALPROTELP", "ALBUMINELP", "A1GS", "A2GS", "BETS", "BETA2SER", "GAMS", "MSPIKE", "SPEI" Lab Results  Component Value Date   TIBC 356 12/28/2020   TIBC 359 11/30/2019   TIBC 333 05/18/2019   FERRITIN  46 12/28/2020   FERRITIN 59 11/30/2019   FERRITIN 87 05/18/2019   IRONPCTSAT 27 12/28/2020   IRONPCTSAT 36 (H) 11/30/2019   IRONPCTSAT 29 05/18/2019   Lab Results  Component Value Date   LDH 130 12/28/2020   LDH 114 11/30/2019   LDH 119 05/18/2019     STUDIES:   No results found.

## 2022-11-07 ENCOUNTER — Other Ambulatory Visit: Payer: Self-pay | Admitting: Hematology

## 2022-11-07 ENCOUNTER — Inpatient Hospital Stay: Payer: BC Managed Care – PPO | Admitting: Hematology

## 2022-11-07 VITALS — BP 154/94 | HR 110 | Temp 98.7°F | Resp 20 | Wt 158.1 lb

## 2022-11-07 DIAGNOSIS — Z90721 Acquired absence of ovaries, unilateral: Secondary | ICD-10-CM | POA: Diagnosis not present

## 2022-11-07 DIAGNOSIS — D0512 Intraductal carcinoma in situ of left breast: Secondary | ICD-10-CM

## 2022-11-07 DIAGNOSIS — C50811 Malignant neoplasm of overlapping sites of right female breast: Secondary | ICD-10-CM | POA: Diagnosis not present

## 2022-11-07 DIAGNOSIS — Z800 Family history of malignant neoplasm of digestive organs: Secondary | ICD-10-CM | POA: Diagnosis not present

## 2022-11-07 DIAGNOSIS — Z170 Estrogen receptor positive status [ER+]: Secondary | ICD-10-CM

## 2022-11-07 DIAGNOSIS — Z08 Encounter for follow-up examination after completed treatment for malignant neoplasm: Secondary | ICD-10-CM | POA: Diagnosis not present

## 2022-11-07 DIAGNOSIS — D500 Iron deficiency anemia secondary to blood loss (chronic): Secondary | ICD-10-CM

## 2022-11-07 DIAGNOSIS — Z86000 Personal history of in-situ neoplasm of breast: Secondary | ICD-10-CM | POA: Diagnosis not present

## 2022-11-07 DIAGNOSIS — Z79811 Long term (current) use of aromatase inhibitors: Secondary | ICD-10-CM | POA: Diagnosis not present

## 2022-11-07 MED ORDER — LETROZOLE 2.5 MG PO TABS: 2.5000 mg | ORAL_TABLET | Freq: Every day | ORAL | 5 refills | Status: DC

## 2022-11-07 NOTE — Patient Instructions (Signed)
[facility]  Discharge Instructions  You were seen and examined today by Dr. Ellin Saba.  Dr. Ellin Saba discussed your most recent lab work which revealed that everything looks stable.  Dr. Ellin Saba is going to repeat mammogram and in addition he is going to order MRI due to your breast tissue being dense.  Follow-up as scheduled in 6 months.    Thank you for choosing [facility] to provide your oncology and hematology care.   To afford each patient quality time with our provider, please arrive at least 15 minutes before your scheduled appointment time. You may need to reschedule your appointment if you arrive late (10 or more minutes). Arriving late affects you and other patients whose appointments are after yours.  Also, if you miss three or more appointments without notifying the office, you may be dismissed from the clinic at the provider's discretion.    Again, thank you for choosing [facility].  Our hope is that these requests will decrease the amount of time that you wait before being seen by our physicians.   If you have a lab appointment with the Cancer Center - please note that after April 8th, all labs will be drawn in the cancer center.  You do not have to check in or register with the main entrance as you have in the past but will complete your check-in at the cancer center.            _____________________________________________________________  Should you have questions after your visit to [facility], please contact our office at [phone] and follow the prompts.  Our office hours are 8:00 a.m. to 4:30 p.m. Monday - Thursday and 8:00 a.m. to 2:30 p.m. Friday.  Please note that voicemails left after 4:00 p.m. may not be returned until the following business day.  We are closed weekends and all major holidays.  You do have access to a nurse 24-7, just call the main number to the clinic  [phone] and do not press any options, hold on the line and a nurse will answer the phone.    For prescription refill requests, have your pharmacy contact our office and allow 72 hours.    Masks are no longer required in the cancer centers. If you would like for your care team to wear a mask while they are taking care of you, please let them know. You may have one support person who is at least 58 years old accompany you for your appointments.

## 2022-12-11 ENCOUNTER — Ambulatory Visit (HOSPITAL_COMMUNITY)
Admission: RE | Admit: 2022-12-11 | Discharge: 2022-12-11 | Disposition: A | Discharge status: Home / Self Care | Payer: BC Managed Care – PPO | Source: Ambulatory Visit | Attending: Hematology | Admitting: Hematology

## 2022-12-11 DIAGNOSIS — Z853 Personal history of malignant neoplasm of breast: Secondary | ICD-10-CM | POA: Diagnosis not present

## 2022-12-11 DIAGNOSIS — Z170 Estrogen receptor positive status [ER+]: Secondary | ICD-10-CM

## 2022-12-11 DIAGNOSIS — R92333 Mammographic heterogeneous density, bilateral breasts: Secondary | ICD-10-CM | POA: Diagnosis not present

## 2022-12-11 DIAGNOSIS — D0512 Intraductal carcinoma in situ of left breast: Secondary | ICD-10-CM | POA: Diagnosis not present

## 2022-12-11 DIAGNOSIS — C50811 Malignant neoplasm of overlapping sites of right female breast: Secondary | ICD-10-CM | POA: Diagnosis not present

## 2022-12-18 ENCOUNTER — Ambulatory Visit (HOSPITAL_COMMUNITY): Admission: RE | Admit: 2022-12-18 | Payer: BC Managed Care – PPO | Source: Ambulatory Visit

## 2023-01-11 DIAGNOSIS — F418 Other specified anxiety disorders: Secondary | ICD-10-CM | POA: Diagnosis not present

## 2023-01-11 DIAGNOSIS — Z6825 Body mass index (BMI) 25.0-25.9, adult: Secondary | ICD-10-CM | POA: Diagnosis not present

## 2023-01-11 DIAGNOSIS — E663 Overweight: Secondary | ICD-10-CM | POA: Diagnosis not present

## 2023-01-11 DIAGNOSIS — E782 Mixed hyperlipidemia: Secondary | ICD-10-CM | POA: Diagnosis not present

## 2023-05-09 ENCOUNTER — Inpatient Hospital Stay: Payer: BC Managed Care – PPO | Attending: Hematology

## 2023-05-09 DIAGNOSIS — D509 Iron deficiency anemia, unspecified: Secondary | ICD-10-CM | POA: Diagnosis not present

## 2023-05-09 DIAGNOSIS — R110 Nausea: Secondary | ICD-10-CM | POA: Insufficient documentation

## 2023-05-09 DIAGNOSIS — Z79811 Long term (current) use of aromatase inhibitors: Secondary | ICD-10-CM | POA: Insufficient documentation

## 2023-05-09 DIAGNOSIS — D500 Iron deficiency anemia secondary to blood loss (chronic): Secondary | ICD-10-CM

## 2023-05-09 DIAGNOSIS — C50811 Malignant neoplasm of overlapping sites of right female breast: Secondary | ICD-10-CM | POA: Insufficient documentation

## 2023-05-09 DIAGNOSIS — Z170 Estrogen receptor positive status [ER+]: Secondary | ICD-10-CM | POA: Insufficient documentation

## 2023-05-09 DIAGNOSIS — D0512 Intraductal carcinoma in situ of left breast: Secondary | ICD-10-CM

## 2023-05-09 LAB — COMPREHENSIVE METABOLIC PANEL
ALT: 14 U/L (ref 0–44)
AST: 19 U/L (ref 15–41)
Albumin: 4 g/dL (ref 3.5–5.0)
Alkaline Phosphatase: 68 U/L (ref 38–126)
Anion gap: 10 (ref 5–15)
BUN: 14 mg/dL (ref 6–20)
CO2: 25 mmol/L (ref 22–32)
Calcium: 9.2 mg/dL (ref 8.9–10.3)
Chloride: 102 mmol/L (ref 98–111)
Creatinine, Ser: 0.72 mg/dL (ref 0.44–1.00)
GFR, Estimated: >60 mL/min (ref >60)
Glucose, Bld: 92 mg/dL (ref 70–99)
Potassium: 4 mmol/L (ref 3.5–5.1)
Sodium: 137 mmol/L (ref 135–145)
Total Bilirubin: 0.5 mg/dL (ref 0.0–1.2)
Total Protein: 7.8 g/dL (ref 6.5–8.1)

## 2023-05-09 LAB — CBC WITH DIFFERENTIAL/PLATELET
Abs Immature Granulocytes: 0.01 10*3/uL (ref 0.00–0.07)
Basophils Absolute: 0 10*3/uL (ref 0.0–0.1)
Basophils Relative: 1 %
Eosinophils Absolute: 0.1 10*3/uL (ref 0.0–0.5)
Eosinophils Relative: 3 %
HCT: 37.3 % (ref 36.0–46.0)
Hemoglobin: 11.5 g/dL — ABNORMAL LOW (ref 12.0–15.0)
Immature Granulocytes: 0 %
Lymphocytes Relative: 37 %
Lymphs Abs: 1.7 10*3/uL (ref 0.7–4.0)
MCH: 27.3 pg (ref 26.0–34.0)
MCHC: 30.8 g/dL (ref 30.0–36.0)
MCV: 88.4 fL (ref 80.0–100.0)
Monocytes Absolute: 0.3 10*3/uL (ref 0.1–1.0)
Monocytes Relative: 6 %
Neutro Abs: 2.4 10*3/uL (ref 1.7–7.7)
Neutrophils Relative %: 53 %
Platelets: 231 10*3/uL (ref 150–400)
RBC: 4.22 MIL/uL (ref 3.87–5.11)
RDW: 12.6 % (ref 11.5–15.5)
WBC: 4.5 10*3/uL (ref 4.0–10.5)
nRBC: 0 % (ref 0.0–0.2)

## 2023-05-09 LAB — FERRITIN: Ferritin: 54 ng/mL (ref 11–307)

## 2023-05-09 LAB — IRON AND TIBC
Iron: 59 ug/dL (ref 28–170)
Saturation Ratios: 18 % (ref 10.4–31.8)
TIBC: 320 ug/dL (ref 250–450)
UIBC: 261 ug/dL

## 2023-05-15 NOTE — Progress Notes (Signed)
[facility] [address] [place] [address]    Clinic Day:  05/16/23   Referring physician: Assunta Found, MD  Patient Care Team: Assunta Found, MD as PCP - General (Family Medicine) [name], MD as Medical Oncologist (Medical Oncology)   ASSESSMENT & PLAN:   Assessment: Stage I (PT1BN0) right breast invasive ductal carcinoma: - Mammogram on 11/30/2020 was BI-RADS 0 in the right breast. - Right diagnostic mammogram on 12/22/2020 with mass in 9 o'clock position in the right breast. - Right breast 9:00 biopsy on 01/03/2021 with infiltrating ductal carcinoma, grade 1/2. - Right lumpectomy and SLNB on 01/26/2021 by Dr. Carolynne Edouard. - Pathology with 0.9 cm grade 1 IDC, margins negative, 0/8 lymph nodes positive, ER 50%, PR negative, HER2 2+ by IHC, HER2 negative by FISH.  Ki-67 was 1%. - Oncotype DX recurrence score 19.  Distant recurrence risk at 9 years was 6%.  Chemotherapy benefit was less than 1%. - FSH and estradiol levels were indicated of postmenopausal state. - Anastrozole 1 mg started and was discontinued due to nausea and tiredness. - Letrozole was also discontinued due to nausea and tiredness. - She was evaluated by radiation oncology Dr. Langston Masker and was offered short course of radiation, which she refused.   History of left breast DCIS: - Lumpectomy on 10/22/2008, followed by XRT completed on 01/21/2009. - DCIS was intermediate grade, 2.7 cm, close to the chest wall, ER positive, PR negative.  2 mm of negative margins. - She refused antiestrogen therapy.  3.  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  She has 2 children.  Menarche-13, menopause unknown as she had uterine ablation (2010 secondary to heavy cycles) and unilateral salpingo-oophorectomy.  FCB-25y.  She took OCP for 4 years.  No HRT.  Used vaginal estrogen cream for 8 months. - She is a non-smoker. - Maternal aunt died of colon cancer at age 42.  No family history of breast  cancer.  Plan: Stage I (PT1BN0) right breast invasive ductal carcinoma, ER 50% positive, PR negative, HER2 negative: - At last visit, I have started her on Femara 2.5 mg daily. - She reported having severe nausea with it.  She even tried taking Compazine with it.  Nausea did not improve despite taking Femara at bedtime.  She is taking it once a week. - I have recommended that she take Compazine 30 to 60 minutes prior to Femara and increase Femara to 3 times a week. - We reviewed mammogram from 12/11/2022: BI-RADS Category 2.  Breast exam was not done today. - Labs reviewed from 05/09/2023: Within normal limits. - RTC  with labs after mammogram and August.   2.  Bone health: - She will continue calcium and vitamin D supplements.  Last vitamin D is 37.  3.  Iron deficiency anemia: - She required parenteral iron therapy in the past. - Hemoglobin is 11.5 with ferritin of 54.  No indication at this time.   Breast Cancer therapy associated bone loss: I have recommended calcium, Vitamin D and weight bearing exercises.  Orders Placed This Encounter  Procedures   MM DIAG BREAST TOMO BILATERAL    Standing Status:   Future    Expected Date:   11/13/2023    Expiration Date:   05/15/2024    Reason for Exam (SYMPTOM  OR DIAGNOSIS REQUIRED):   breast cancer screening, hx of breast cancer    Is the patient pregnant?:   No    Preferred  imaging location?:   [facility]   CBC with Differential    Standing Status:   Future    Expiration Date:   05/15/2024   Comprehensive metabolic panel    Standing Status:   Future    Expiration Date:   05/15/2024   VITAMIN D 25 Hydroxy (Vit-D Deficiency, Fractures)    Standing Status:   Future    Expiration Date:   05/15/2024   Iron and TIBC ([facility] ONLY)    Standing Status:   Future    Expiration Date:   05/15/2024   Ferritin    Standing Status:   Future    Expiration Date:   05/15/2024      [name],acting as a scribe for  [name], MD.,have documented all relevant documentation on the behalf of [name], MD,as directed by  [name], MD while in the presence of [name], MD.  I, [name] MD, have reviewed the above documentation for accuracy and completeness, and I agree with the above.    [name], MD   1/16/20259:03 AM  CHIEF COMPLAINT:   Diagnosis: right breast cancer, history of left breast DCIS, and IDA   Cancer Staging  Ductal carcinoma in situ of breast Staging form: Breast, AJCC 7th Edition - Clinical stage from 04/22/2015: Stage 0 (Tis (DCIS), N0, M0) - Signed by Ellouise Newer, PA-C on 04/22/2015  Malignant neoplasm of overlapping sites of right female breast [facility]) Staging form: Breast, AJCC 8th Edition - Clinical stage from 01/10/2021: Stage IA (cT1b, cN0, cM0, G2, ER+, PR-, HER2: Equivocal) - Unsigned    Prior Therapy: Anastrozole, tamoxifen  Current Therapy: Letrozole   HISTORY OF PRESENT ILLNESS:   Oncology History  Ductal carcinoma in situ of breast  09/07/2011 Initial Diagnosis   Ductal carcinoma in situ of breast    Genetic Testing   The [facility] CancerNext-Expanded Panel was Negative. Report date is 02/22/2021.  The CancerNext-Expanded gene panel offered by [facility] and includes sequencing, rearrangement, and RNA analysis for the following 77 genes: AIP, ALK, APC, ATM, AXIN2, BAP1, BARD1, BLM, BMPR1A, BRCA1, BRCA2, BRIP1, CDC73, CDH1, CDK4, CDKN1B, CDKN2A, CHEK2, CTNNA1, DICER1, FANCC, FH, FLCN, GALNT12, KIF1B, LZTR1, MAX, MEN1, MET, MLH1, MSH2, MSH3, MSH6, MUTYH, NBN, NF1, NF2, NTHL1, PALB2, PHOX2B, PMS2, POT1, PRKAR1A, PTCH1, PTEN, RAD51C, RAD51D, RB1, RECQL, RET, SDHA, SDHAF2, SDHB, SDHC, SDHD, SMAD4, SMARCA4, SMARCB1, SMARCE1, STK11, SUFU, TMEM127, TP53, TSC1, TSC2, VHL and XRCC2 (sequencing and deletion/duplication); EGFR, EGLN1, HOXB13, KIT, MITF, PDGFRA, POLD1, and POLE (sequencing only); EPCAM and GREM1  (deletion/duplication only).    Malignant neoplasm of overlapping sites of right female breast (HCC)  02/16/2021 Genetic Testing   Oncotype DX:       INTERVAL HISTORY:   Ellivia is a 58 y.o. female presenting to clinic today for follow up of right breast cancer, history of left breast DCIS, and IDA. She was last seen by me on 11/07/22.  Since her last visit, she had a diagnostic bilateral MM on 12/11/22 which found no mammographic evidence for malignancy.  Today, she states that she is doing well overall. Her appetite level is at 100%. Her energy level is at 100%.  She is taking letrozole 1 x a week and is unable to tolerate it daily due to side effects of nausea, and diarrhea if taking it at night. She has taken letrozole every other day and symptoms persisted, causing her to switch to weekly doses. Compazine was ineffective in improving nausea, though she  states she does not have a large appetite regularly. She denies any hot flashes or new onset aches/pains. She continues to take vitamin D 3 supplements. She does not measure her blood pressure at home and is not on any blood pressure medications. She would like to know if she can take a probiotic supplement without any adverse effects.   PAST MEDICAL HISTORY:   Past Medical History: Past Medical History:  Diagnosis Date   Breast CA (HCC) 09/07/2011   Breast cancer (HCC) 09/2008   DCIS   Breast cancer (HCC) 11/28/2020   Right Breast   Cancer (HCC)    lt breast ca 2010/surg-lumpectomy/rad tx   Endometriosis    H/O bilateral oophorectomy 03/19/2014   Iron deficiency anemia    Iron deficiency anemia 09/07/2011   PONV (postoperative nausea and vomiting)     Surgical History: Past Surgical History:  Procedure Laterality Date   ABDOMINAL HYSTERECTOMY  2010   BILATERAL OOPHORECTOMY Bilateral 2010   BREAST LUMPECTOMY WITH RADIOACTIVE SEED AND SENTINEL LYMPH NODE BIOPSY Right 01/26/2021   Procedure: RIGHT BREAST LUMPECTOMY WITH  RADIOACTIVE SEED AND SENTINEL LYMPH NODE BIOPSY;  Surgeon: Griselda Miner, MD;  Location: [facility];  Service: General;  Laterality: Right;   CESAREAN SECTION     CHOLECYSTECTOMY  2005   lap choli   COLONOSCOPY N/A 05/12/2015   Procedure: COLONOSCOPY;  Surgeon: Malissa Hippo, MD;  [facility];  Service: Endoscopy;  Laterality: N/A;  955 - moved to 1/12 @ 9:30   COLONOSCOPY N/A 08/04/2020   Procedure: COLONOSCOPY;  Surgeon: Malissa Hippo, MD;  Location: [facility];  Service: Endoscopy;  Laterality: N/A;  am   DIAGNOSTIC LAPAROSCOPY  2004   incisional mass   LAPAROSCOPY  2006   LOA   LIPOSUCTION WITH LIPOFILLING Left 05/13/2014   Procedure: LIPOSUCTION WITH LIPOFILLING FOR SYMMETRY;  Surgeon: Wayland Denis, DO;  Location: [facility];  Service: Plastics;  Laterality: Left;  liposuction from abdomen, lipofilling to left breast   lumpectomy  2010   left lump-snbx   POLYPECTOMY  08/04/2020   Procedure: POLYPECTOMY;  Surgeon: Malissa Hippo, MD;  Location: [facility];  Service: Endoscopy;;    Social History: Social History   Socioeconomic History   Marital status: Married    Spouse name: Not on file   Number of children: Not on file   Years of education: Not on file   Highest education level: Not on file  Occupational History   Not on file  Tobacco Use   Smoking status: Former    Current packs/day: 0.00    Types: Cigarettes    Quit date: 05/06/1996    Years since quitting: 27.0   Smokeless tobacco: Never  Vaping Use   Vaping status: Never Used  Substance and Sexual Activity   Alcohol use: Yes    Alcohol/week: 2.0 standard drinks of alcohol    Types: 2 Glasses of wine per week    Comment: red wine, glass a night   Drug use: No   Sexual activity: Yes  Other Topics Concern   Not on file  Social History Narrative   Not on file   Social Drivers of Health   Financial Resource Strain: Not on file  Food Insecurity: Not on file   Transportation Needs: Not on file  Physical Activity: Not on file  Stress: Not on file  Social Connections: Not on file  Intimate Partner Violence: Not At Risk (01/10/2021)  Humiliation, Afraid, Rape, and Kick questionnaire    Fear of Current or Ex-Partner: No    Emotionally Abused: No    Physically Abused: No    Sexually Abused: No    Family History: Family History  Problem Relation Age of Onset   Heart attack Father    Colon cancer Maternal Aunt 42       possibly ovarian cancer instead of colon cancer    Current Medications:  Current Outpatient Medications:    ALPRAZolam (XANAX) 0.5 MG tablet, Take 0.5 mg by mouth daily as needed for anxiety., Disp: , Rfl:    b complex vitamins capsule, Take 1 capsule by mouth daily., Disp: , Rfl:    Cholecalciferol (VITAMIN D-3 PO), Take 200 mcg by mouth., Disp: , Rfl:    letrozole (FEMARA) 2.5 MG tablet, Take 1 tablet (2.5 mg total) by mouth daily., Disp: 30 tablet, Rfl: 5   MAGNESIUM PO, Take 500 mcg by mouth., Disp: , Rfl:    Multiple Vitamins-Minerals (WOMENS MULTIVITAMIN PO), Take 1 tablet by mouth daily., Disp: , Rfl:    prochlorperazine (COMPAZINE) 10 MG tablet, Take 1 tablet (10 mg total) by mouth every 6 (six) hours as needed for nausea or vomiting., Disp: 30 tablet, Rfl: 6   Allergies: Allergies  Allergen Reactions   Cortisone Diarrhea and Other (See Comments)    Injection form:  "caused my chest to get tight; gave diarrhea really bad"     REVIEW OF SYSTEMS:   Review of Systems  Constitutional:  Negative for chills, fatigue and fever.  HENT:   Negative for lump/mass, mouth sores, nosebleeds, sore throat and trouble swallowing.   Eyes:  Negative for eye problems.  Respiratory:  Negative for cough and shortness of breath.   Cardiovascular:  Negative for chest pain, leg swelling and palpitations.  Gastrointestinal:  Negative for abdominal pain, constipation, diarrhea, nausea and vomiting.  Genitourinary:  Negative for  bladder incontinence, difficulty urinating, dysuria, frequency, hematuria and nocturia.   Musculoskeletal:  Negative for arthralgias, back pain, flank pain, myalgias and neck pain.  Skin:  Negative for itching and rash.  Neurological:  Negative for dizziness, headaches and numbness.  Hematological:  Does not bruise/bleed easily.  Psychiatric/Behavioral:  Negative for depression, sleep disturbance and suicidal ideas. The patient is not nervous/anxious.   All other systems reviewed and are negative.    VITALS:   Blood pressure (!) 122/91, pulse (!) 121, temperature 98.2 F (36.8 C), temperature source Oral, resp. rate 16, weight 162 lb 7.7 oz (73.7 kg), SpO2 99%.  Wt Readings from Last 3 Encounters:  05/16/23 162 lb 7.7 oz (73.7 kg)  11/07/22 158 lb 1.6 oz (71.7 kg)  07/12/21 163 lb 4.8 oz (74.1 kg)    Body mass index is 24.7 kg/m.  Performance status (ECOG): 0 - Asymptomatic  PHYSICAL EXAM:   Physical Exam Vitals and nursing note reviewed. Exam conducted with a chaperone present.  Constitutional:      Appearance: Normal appearance.  Cardiovascular:     Rate and Rhythm: Normal rate and regular rhythm.     Pulses: Normal pulses.     Heart sounds: Normal heart sounds.  Pulmonary:     Effort: Pulmonary effort is normal.     Breath sounds: Normal breath sounds.  Abdominal:     Palpations: Abdomen is soft. There is no hepatomegaly, splenomegaly or mass.     Tenderness: There is no abdominal tenderness.  Musculoskeletal:     Right lower leg: No edema.  Left lower leg: No edema.  Lymphadenopathy:     Cervical: No cervical adenopathy.     Right cervical: No superficial, deep or posterior cervical adenopathy.    Left cervical: No superficial, deep or posterior cervical adenopathy.     Upper Body:     Right upper body: No supraclavicular or axillary adenopathy.     Left upper body: No supraclavicular or axillary adenopathy.  Neurological:     General: No focal deficit  present.     Mental Status: She is alert and oriented to person, place, and time.  Psychiatric:        Mood and Affect: Mood normal.        Behavior: Behavior normal.     LABS:      Latest Ref Rng & Units 05/09/2023    8:11 AM 10/30/2022    7:56 AM 06/26/2021    4:05 PM  CBC  WBC 4.0 - 10.5 K/uL 4.5  3.8  4.7   Hemoglobin 12.0 - 15.0 g/dL 95.6  38.7  56.4   Hematocrit 36.0 - 46.0 % 37.3  35.0  37.2   Platelets 150 - 400 K/uL 231  237  228       Latest Ref Rng & Units 05/09/2023    8:11 AM 10/30/2022    7:56 AM 06/26/2021    4:05 PM  CMP  Glucose 70 - 99 mg/dL 92  332  951   BUN 6 - 20 mg/dL 14  13  18    Creatinine 0.44 - 1.00 mg/dL 8.84  1.66  0.63   Sodium 135 - 145 mmol/L 137  141  138   Potassium 3.5 - 5.1 mmol/L 4.0  3.5  4.0   Chloride 98 - 111 mmol/L 102  106  104   CO2 22 - 32 mmol/L 25  27  26    Calcium 8.9 - 10.3 mg/dL 9.2  9.1  9.4   Total Protein 6.5 - 8.1 g/dL 7.8  7.1  7.8   Total Bilirubin 0.0 - 1.2 mg/dL 0.5  0.4  0.2   Alkaline Phos 38 - 126 U/L 68  52  59   AST 15 - 41 U/L 19  21  22    ALT 0 - 44 U/L 14  15  19       No results found for: "CEA1", "CEA" / No results found for: "CEA1", "CEA" No results found for: "PSA1" No results found for: "KZS010" No results found for: "CAN125"  No results found for: "TOTALPROTELP", "ALBUMINELP", "A1GS", "A2GS", "BETS", "BETA2SER", "GAMS", "MSPIKE", "SPEI" Lab Results  Component Value Date   TIBC 320 05/09/2023   TIBC 356 12/28/2020   TIBC 359 11/30/2019   FERRITIN 54 05/09/2023   FERRITIN 46 12/28/2020   FERRITIN 59 11/30/2019   IRONPCTSAT 18 05/09/2023   IRONPCTSAT 27 12/28/2020   IRONPCTSAT 36 (H) 11/30/2019   Lab Results  Component Value Date   LDH 130 12/28/2020   LDH 114 11/30/2019   LDH 119 05/18/2019     STUDIES:   No results found.

## 2023-05-16 ENCOUNTER — Other Ambulatory Visit (HOSPITAL_COMMUNITY): Payer: Self-pay | Admitting: Hematology

## 2023-05-16 ENCOUNTER — Inpatient Hospital Stay: Payer: BC Managed Care – PPO | Admitting: Hematology

## 2023-05-16 VITALS — BP 122/91 | HR 121 | Temp 98.2°F | Resp 16 | Wt 162.5 lb

## 2023-05-16 DIAGNOSIS — Z9889 Other specified postprocedural states: Secondary | ICD-10-CM

## 2023-05-16 DIAGNOSIS — D0512 Intraductal carcinoma in situ of left breast: Secondary | ICD-10-CM | POA: Diagnosis not present

## 2023-05-16 DIAGNOSIS — D509 Iron deficiency anemia, unspecified: Secondary | ICD-10-CM | POA: Diagnosis not present

## 2023-05-16 DIAGNOSIS — D500 Iron deficiency anemia secondary to blood loss (chronic): Secondary | ICD-10-CM

## 2023-05-16 DIAGNOSIS — C50811 Malignant neoplasm of overlapping sites of right female breast: Secondary | ICD-10-CM | POA: Diagnosis not present

## 2023-05-16 DIAGNOSIS — Z170 Estrogen receptor positive status [ER+]: Secondary | ICD-10-CM | POA: Diagnosis not present

## 2023-05-16 DIAGNOSIS — R110 Nausea: Secondary | ICD-10-CM | POA: Diagnosis not present

## 2023-05-16 DIAGNOSIS — Z79811 Long term (current) use of aromatase inhibitors: Secondary | ICD-10-CM | POA: Diagnosis not present

## 2023-05-16 NOTE — Patient Instructions (Signed)
[facility] at [facility] Discharge Instructions   You were seen and examined today by Dr. Ellin Saba.  He reviewed the results of your lab work which are normal/stable.   We will see you back in 6 months.  We will repeat lab work prior to this visit.  Return as scheduled.   Thank you for choosing [facility] at [facility] to provide your oncology and hematology care.  To afford each patient quality time with our provider, please arrive at least 15 minutes before your scheduled appointment time.   If you have a lab appointment with the Cancer Center please come in thru the Main Entrance and check in at the main information desk.  You need to re-schedule your appointment should you arrive 10 or more minutes late.  We strive to give you quality time with our providers, and arriving late affects you and other patients whose appointments are after yours.  Also, if you no show three or more times for appointments you may be dismissed from the clinic at the providers discretion.     Again, thank you for choosing [facility].  Our hope is that these requests will decrease the amount of time that you wait before being seen by our physicians.       _____________________________________________________________  Should you have questions after your visit to [facility], please contact our office at [phone] and follow the prompts.  Our office hours are 8:00 a.m. and 4:30 p.m. Monday - Friday.  Please note that voicemails left after 4:00 p.m. may not be returned until the following business day.  We are closed weekends and major holidays.  You do have access to a nurse 24-7, just call the main number to the clinic [phone] and do not press any options, hold on the line and a nurse will answer the phone.    For prescription refill requests, have your pharmacy contact our office and allow 72 hours.    Due to Covid, you will  need to wear a mask upon entering the hospital. If you do not have a mask, a mask will be given to you at the Main Entrance upon arrival. For doctor visits, patients may have 1 support person age 79 or older with them. For treatment visits, patients can not have anyone with them due to social distancing guidelines and our immunocompromised population.

## 2023-05-28 DIAGNOSIS — Z01419 Encounter for gynecological examination (general) (routine) without abnormal findings: Secondary | ICD-10-CM | POA: Diagnosis not present

## 2023-08-08 DIAGNOSIS — Z6824 Body mass index (BMI) 24.0-24.9, adult: Secondary | ICD-10-CM | POA: Diagnosis not present

## 2023-08-08 DIAGNOSIS — E782 Mixed hyperlipidemia: Secondary | ICD-10-CM | POA: Diagnosis not present

## 2023-08-08 DIAGNOSIS — F418 Other specified anxiety disorders: Secondary | ICD-10-CM | POA: Diagnosis not present

## 2023-09-26 NOTE — Telephone Encounter (Signed)
 Veronica Dudley

## 2023-11-06 ENCOUNTER — Other Ambulatory Visit: Payer: BC Managed Care – PPO

## 2023-11-13 ENCOUNTER — Ambulatory Visit: Payer: BC Managed Care – PPO | Admitting: Hematology

## 2023-12-10 DIAGNOSIS — Z6825 Body mass index (BMI) 25.0-25.9, adult: Secondary | ICD-10-CM | POA: Diagnosis not present

## 2023-12-10 DIAGNOSIS — L239 Allergic contact dermatitis, unspecified cause: Secondary | ICD-10-CM | POA: Diagnosis not present

## 2023-12-17 ENCOUNTER — Inpatient Hospital Stay: Payer: BC Managed Care – PPO | Attending: Oncology

## 2023-12-17 ENCOUNTER — Ambulatory Visit (HOSPITAL_COMMUNITY)
Admission: RE | Admit: 2023-12-17 | Discharge: 2023-12-17 | Disposition: A | Discharge status: Home / Self Care | Payer: BC Managed Care – PPO | Source: Ambulatory Visit | Attending: Hematology | Admitting: Hematology

## 2023-12-17 ENCOUNTER — Other Ambulatory Visit: Payer: Self-pay | Admitting: Hematology

## 2023-12-17 DIAGNOSIS — D500 Iron deficiency anemia secondary to blood loss (chronic): Secondary | ICD-10-CM

## 2023-12-17 DIAGNOSIS — D509 Iron deficiency anemia, unspecified: Secondary | ICD-10-CM | POA: Insufficient documentation

## 2023-12-17 DIAGNOSIS — D0512 Intraductal carcinoma in situ of left breast: Secondary | ICD-10-CM | POA: Insufficient documentation

## 2023-12-17 DIAGNOSIS — C50811 Malignant neoplasm of overlapping sites of right female breast: Secondary | ICD-10-CM | POA: Insufficient documentation

## 2023-12-17 DIAGNOSIS — Z9889 Other specified postprocedural states: Secondary | ICD-10-CM | POA: Diagnosis not present

## 2023-12-17 DIAGNOSIS — Z79811 Long term (current) use of aromatase inhibitors: Secondary | ICD-10-CM | POA: Insufficient documentation

## 2023-12-17 DIAGNOSIS — Z170 Estrogen receptor positive status [ER+]: Secondary | ICD-10-CM | POA: Diagnosis not present

## 2023-12-17 DIAGNOSIS — R92333 Mammographic heterogeneous density, bilateral breasts: Secondary | ICD-10-CM | POA: Diagnosis not present

## 2023-12-17 LAB — CBC WITH DIFFERENTIAL/PLATELET
Abs Immature Granulocytes: 0 K/uL (ref 0.00–0.07)
Basophils Absolute: 0 K/uL (ref 0.0–0.1)
Basophils Relative: 1 %
Eosinophils Absolute: 0.1 K/uL (ref 0.0–0.5)
Eosinophils Relative: 2 %
HCT: 36.1 % (ref 36.0–46.0)
Hemoglobin: 11.4 g/dL — ABNORMAL LOW (ref 12.0–15.0)
Immature Granulocytes: 0 %
Lymphocytes Relative: 35 %
Lymphs Abs: 1.5 K/uL (ref 0.7–4.0)
MCH: 27.7 pg (ref 26.0–34.0)
MCHC: 31.6 g/dL (ref 30.0–36.0)
MCV: 87.8 fL (ref 80.0–100.0)
Monocytes Absolute: 0.2 K/uL (ref 0.1–1.0)
Monocytes Relative: 5 %
Neutro Abs: 2.4 K/uL (ref 1.7–7.7)
Neutrophils Relative %: 57 %
Platelets: 237 K/uL (ref 150–400)
RBC: 4.11 MIL/uL (ref 3.87–5.11)
RDW: 12.7 % (ref 11.5–15.5)
WBC: 4.3 K/uL (ref 4.0–10.5)
nRBC: 0 % (ref 0.0–0.2)

## 2023-12-17 LAB — COMPREHENSIVE METABOLIC PANEL WITH GFR
ALT: 34 U/L (ref 0–44)
AST: 30 U/L (ref 15–41)
Albumin: 4 g/dL (ref 3.5–5.0)
Alkaline Phosphatase: 79 U/L (ref 38–126)
Anion gap: 12 (ref 5–15)
BUN: 14 mg/dL (ref 6–20)
CO2: 24 mmol/L (ref 22–32)
Calcium: 9.4 mg/dL (ref 8.9–10.3)
Chloride: 106 mmol/L (ref 98–111)
Creatinine, Ser: 0.72 mg/dL (ref 0.44–1.00)
GFR, Estimated: >60 mL/min (ref >60)
Glucose, Bld: 98 mg/dL (ref 70–99)
Potassium: 3.8 mmol/L (ref 3.5–5.1)
Sodium: 142 mmol/L (ref 135–145)
Total Bilirubin: 0.8 mg/dL (ref 0.0–1.2)
Total Protein: 7.5 g/dL (ref 6.5–8.1)

## 2023-12-17 LAB — IRON AND TIBC
Iron: 100 ug/dL (ref 28–170)
Saturation Ratios: 33 % — ABNORMAL HIGH (ref 10.4–31.8)
TIBC: 303 ug/dL (ref 250–450)
UIBC: 203 ug/dL

## 2023-12-17 LAB — VITAMIN D 25 HYDROXY (VIT D DEFICIENCY, FRACTURES): Vit D, 25-Hydroxy: 61.88 ng/mL (ref 30–100)

## 2023-12-17 LAB — FERRITIN: Ferritin: 52 ng/mL (ref 11–307)

## 2023-12-23 NOTE — Assessment & Plan Note (Addendum)
-   She required parenteral iron  therapy in the past. - Hemoglobin is 11.4.  Iron  saturation 33%.  Ferritin 52.

## 2023-12-23 NOTE — Assessment & Plan Note (Addendum)
-   Lumpectomy on 10/22/2008, followed by XRT completed on 01/21/2009. - DCIS was intermediate grade, 2.7 cm, close to the chest wall, ER positive, PR negative.  2 mm of negative margins. - She refused antiestrogen therapy.

## 2023-12-23 NOTE — Progress Notes (Unsigned)
 [facility] OFFICE PROGRESS NOTE  Marvine Rush, MD  ASSESSMENT & PLAN:   Assessment & Plan Ductal carcinoma in situ (DCIS) of left breast - Lumpectomy on 10/22/2008, followed by XRT completed on 01/21/2009. - DCIS was intermediate grade, 2.7 cm, close to the chest wall, ER positive, PR negative.  2 mm of negative margins. - She refused antiestrogen therapy. Iron  deficiency anemia, unspecified iron  deficiency anemia type - She required parenteral iron  therapy in the past. - Hemoglobin is 11.4.  Iron  saturation 33%.  Ferritin 52.  Baseline hemoglobin is between 10.9 and 11.7.  Ferritin is more or less stable. Unable to tolerate iron  supplements.  No additional iron  needed at this time.  Malignant neoplasm of overlapping sites of right female breast, unspecified estrogen receptor status (HCC) - Unable to tolerate anastrozole  due to nausea.  Started on Femara  and is only able to take it 3 days/week without nausea. Did not like how she felt while taking compazine .  -Diagnostic mammogram from 12/17/2023 was normal.   - RTC 6 months with labs a few days before.  -Screening mammogram in 1 year. Mood changes Possibly related to letrozole .  Discussed stopping Letrozole  for 30 days and following up in 1 month to see if sx have changed/improved.  Could switch to a different AI?   Orders Placed This Encounter  Procedures   Ferritin    Standing Status:   Future    Expected Date:   06/24/2024    Expiration Date:   09/22/2024   Iron  and TIBC ([facility] ONLY)    Standing Status:   Future    Expected Date:   06/24/2024    Expiration Date:   09/22/2024   VITAMIN D  25 Hydroxy (Vit-D Deficiency, Fractures)    Standing Status:   Future    Expected Date:   06/24/2024    Expiration Date:   09/22/2024   Comprehensive metabolic panel    Standing Status:   Future    Expected Date:   06/24/2024    Expiration Date:   09/22/2024   CBC with Differential    Standing Status:    Future    Expected Date:   06/24/2024    Expiration Date:   09/22/2024   Ambulatory referral to Plastic Surgery    Referral Priority:   Routine    Referral Type:   Surgical    Referral Reason:   Specialty Services Required    Referred to Provider:   Lowery Estefana RAMAN, DO    Requested Specialty:   Plastic Surgery    Number of Visits Requested:   1    INTERVAL HISTORY: Patient returns for follow-up.  Mammogram from 12/17/2023 was read as BI-RADS Category 2 benign.  No interval hospitalizations, surgeries or changes to her baseline health.  She has been taking letrozole  2-3 times per week without nausea medications. Reports she did not like the way the compazine  made her feel.  She has also tried anastrozole  with poor tolerance.  Reports constipation with iron  supplement so she is not taking. Energy levels are stable. Reports some mood swings and changes in her sex drive. Feels like sx started around the time she switched from anastrozole  to letrozole .   Reports she had genetic counseling and everything was negative.  We reviewed CBC, CMP, vitamin D , ferritin.  SUMMARY OF HEMATOLOGIC HISTORY: Oncology History  Ductal carcinoma in situ of breast  09/07/2011 Initial Diagnosis   Ductal carcinoma in situ of breast  Genetic Testing   The [facility] CancerNext-Expanded Panel was Negative. Report date is 02/22/2021.  The CancerNext-Expanded gene panel offered by [facility] and includes sequencing, rearrangement, and RNA analysis for the following 77 genes: AIP, ALK, APC, ATM, AXIN2, BAP1, BARD1, BLM, BMPR1A, BRCA1, BRCA2, BRIP1, CDC73, CDH1, CDK4, CDKN1B, CDKN2A, CHEK2, CTNNA1, DICER1, FANCC, FH, FLCN, GALNT12, KIF1B, LZTR1, MAX, MEN1, MET, MLH1, MSH2, MSH3, MSH6, MUTYH, NBN, NF1, NF2, NTHL1, PALB2, PHOX2B, PMS2, POT1, PRKAR1A, PTCH1, PTEN, RAD51C, RAD51D, RB1, RECQL, RET, SDHA, SDHAF2, SDHB, SDHC, SDHD, SMAD4, SMARCA4, SMARCB1, SMARCE1, STK11, SUFU, TMEM127, TP53, TSC1, TSC2, VHL and XRCC2  (sequencing and deletion/duplication); EGFR, EGLN1, HOXB13, KIT, MITF, PDGFRA, POLD1, and POLE (sequencing only); EPCAM and GREM1 (deletion/duplication only).    Malignant neoplasm of overlapping sites of right female breast (HCC)  02/16/2021 Genetic Testing   Oncotype DX:     Stage I (PT1BN0) right breast invasive ductal carcinoma: - Mammogram on 11/30/2020 was BI-RADS 0 in the right breast. - Right diagnostic mammogram on 12/22/2020 with mass in 9 o'clock position in the right breast. - Right breast 9:00 biopsy on 01/03/2021 with infiltrating ductal carcinoma, grade 1/2. - Right lumpectomy and SLNB on 01/26/2021 by Dr. Curvin. - Pathology with 0.9 cm grade 1 IDC, margins negative, 0/8 lymph nodes positive, ER 50%, PR negative, HER2 2+ by IHC, HER2 negative by FISH.  Ki-67 was 1%. - Oncotype DX recurrence score 19.  Distant recurrence risk at 9 years was 6%.  Chemotherapy benefit was less than 1%. - FSH and estradiol levels were indicated of postmenopausal state. - Anastrozole  1 mg started and was discontinued due to nausea and tiredness. - Letrozole  was also discontinued due to nausea and tiredness. - She was evaluated by radiation oncology Dr. Dannielle and was offered short course of radiation, which she refused.   History of left breast DCIS: - Lumpectomy on 10/22/2008, followed by XRT completed on 01/21/2009. - DCIS was intermediate grade, 2.7 cm, close to the chest wall, ER positive, PR negative.  2 mm of negative margins. - She refused antiestrogen therapy.  3.  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  She has 2 children.  Menarche-13, menopause unknown as she had uterine ablation (2010 secondary to heavy cycles) and unilateral salpingo-oophorectomy.  FCB-25y.  She took OCP for 4 years.  No HRT.  Used vaginal estrogen cream for 8 months. - She is a non-smoker. - Maternal aunt died of colon cancer at age 47.  No family history of breast cancer.  CBC    Component Value  Date/Time   WBC 4.3 12/17/2023 0939   RBC 4.11 12/17/2023 0939   HGB 11.4 (L) 12/17/2023 0939   HCT 36.1 12/17/2023 0939   PLT 237 12/17/2023 0939   MCV 87.8 12/17/2023 0939   MCH 27.7 12/17/2023 0939   MCHC 31.6 12/17/2023 0939   RDW 12.7 12/17/2023 0939   LYMPHSABS 1.5 12/17/2023 0939   MONOABS 0.2 12/17/2023 0939   EOSABS 0.1 12/17/2023 0939   BASOSABS 0.0 12/17/2023 0939       Latest Ref Rng & Units 12/17/2023    9:39 AM 05/09/2023    8:11 AM 10/30/2022    7:56 AM  CMP  Glucose 70 - 99 mg/dL 98  92  899   BUN 6 - 20 mg/dL 14  14  13    Creatinine 0.44 - 1.00 mg/dL 9.27  9.27  9.28   Sodium 135 - 145 mmol/L 142  137  141  Potassium 3.5 - 5.1 mmol/L 3.8  4.0  3.5   Chloride 98 - 111 mmol/L 106  102  106   CO2 22 - 32 mmol/L 24  25  27    Calcium 8.9 - 10.3 mg/dL 9.4  9.2  9.1   Total Protein 6.5 - 8.1 g/dL 7.5  7.8  7.1   Total Bilirubin 0.0 - 1.2 mg/dL 0.8  0.5  0.4   Alkaline Phos 38 - 126 U/L 79  68  52   AST 15 - 41 U/L 30  19  21    ALT 0 - 44 U/L 34  14  15      Lab Results  Component Value Date   FERRITIN 52 12/17/2023   VITAMINB12 416 12/28/2020    Vitals:   12/24/23 0942 12/24/23 0947  BP: (!) 148/84 137/80  Pulse: 81   Resp: 18   Temp: 97.9 F (36.6 C)   SpO2: 100%     Review of System:  Review of Systems  Constitutional:  Positive for malaise/fatigue.  Psychiatric/Behavioral:  The patient is nervous/anxious.   All other systems reviewed and are negative.   Physical Exam: Physical Exam Constitutional:      Appearance: Normal appearance.  HENT:     Head: Normocephalic and atraumatic.  Eyes:     Pupils: Pupils are equal, round, and reactive to light.  Cardiovascular:     Rate and Rhythm: Normal rate and regular rhythm.     Heart sounds: Normal heart sounds. No murmur heard. Pulmonary:     Effort: Pulmonary effort is normal.     Breath sounds: Normal breath sounds. No wheezing.  Abdominal:     General: Bowel sounds are normal. There is no  distension.     Palpations: Abdomen is soft.     Tenderness: There is no abdominal tenderness.  Musculoskeletal:        General: Normal range of motion.     Cervical back: Normal range of motion.  Skin:    General: Skin is warm and dry.     Findings: No rash.  Neurological:     Mental Status: She is alert and oriented to person, place, and time.     Gait: Gait is intact.  Psychiatric:        Mood and Affect: Mood and affect normal.        Cognition and Memory: Memory normal.        Judgment: Judgment normal.      I spent 25 minutes dedicated to the care of this patient (face-to-face and non-face-to-face) on the date of the encounter to include what is described in the assessment and plan.,  Delon Hope, NP 12/24/2023 4:06 PM

## 2023-12-23 NOTE — Assessment & Plan Note (Signed)
-   She required parenteral iron  therapy in the past. - Hemoglobin is 11.5 with ferritin of 54.  No indication at this time.

## 2023-12-23 NOTE — Assessment & Plan Note (Addendum)
-   Unable to tolerate anastrozole  due to nausea.  Started on Femara  and is only able to take it 3 days/week without nausea. Did not like how she felt while taking compazine .  -Diagnostic mammogram from 12/17/2023 was normal.   - RTC 6 months with labs a few days before.  -Screening mammogram in 1 year.

## 2023-12-24 ENCOUNTER — Inpatient Hospital Stay (HOSPITAL_BASED_OUTPATIENT_CLINIC_OR_DEPARTMENT_OTHER): Payer: BC Managed Care – PPO | Admitting: Oncology

## 2023-12-24 VITALS — BP 137/80 | HR 81 | Temp 97.9°F | Resp 18 | Wt 163.8 lb

## 2023-12-24 DIAGNOSIS — R4586 Emotional lability: Secondary | ICD-10-CM | POA: Diagnosis not present

## 2023-12-24 DIAGNOSIS — D509 Iron deficiency anemia, unspecified: Secondary | ICD-10-CM | POA: Diagnosis not present

## 2023-12-24 DIAGNOSIS — Z170 Estrogen receptor positive status [ER+]: Secondary | ICD-10-CM | POA: Diagnosis not present

## 2023-12-24 DIAGNOSIS — C50811 Malignant neoplasm of overlapping sites of right female breast: Secondary | ICD-10-CM

## 2023-12-24 DIAGNOSIS — D500 Iron deficiency anemia secondary to blood loss (chronic): Secondary | ICD-10-CM

## 2023-12-24 DIAGNOSIS — D0512 Intraductal carcinoma in situ of left breast: Secondary | ICD-10-CM

## 2023-12-24 DIAGNOSIS — Z79811 Long term (current) use of aromatase inhibitors: Secondary | ICD-10-CM | POA: Diagnosis not present

## 2023-12-24 NOTE — Assessment & Plan Note (Signed)
 Possibly related to letrozole .  Discussed stopping Letrozole  for 30 days and following up in 1 month to see if sx have changed/improved.  Could switch to a different AI?

## 2023-12-26 ENCOUNTER — Ambulatory Visit: Admitting: Plastic Surgery

## 2023-12-26 ENCOUNTER — Encounter: Payer: Self-pay | Admitting: Plastic Surgery

## 2023-12-26 VITALS — BP 127/82 | HR 88

## 2023-12-26 DIAGNOSIS — C50811 Malignant neoplasm of overlapping sites of right female breast: Secondary | ICD-10-CM

## 2023-12-26 DIAGNOSIS — Z853 Personal history of malignant neoplasm of breast: Secondary | ICD-10-CM

## 2023-12-26 DIAGNOSIS — Z923 Personal history of irradiation: Secondary | ICD-10-CM | POA: Diagnosis not present

## 2023-12-26 DIAGNOSIS — N651 Disproportion of reconstructed breast: Secondary | ICD-10-CM

## 2023-12-26 NOTE — Progress Notes (Signed)
 Patient ID: Veronica Dudley, female    DOB: Jan 07, 1966, 58 y.o.   MRN: [id number]   Chief Complaint  Patient presents with   Consult    The patient is a 58 year old female here for a consultation for breast reconstruction.  She had an area of concern in the left breast over 10 years ago.  The patient stated that it was not cancer but she did go through 2 weeks of radiation.  She then had cancer in 2022 that was biopsied and came back as invasive ductal carcinoma.  It was estrogen positive and progesterone negative.  The note indicates that the left breast had DCIS and was treated with radiation which ended 12 years ago.  Asymmetry with the right breast being about a cup size larger than the left.  This makes it difficult for her to fit into close.  She has had additional surgeries as listed below.  She does not want to make the right breast any smaller.  She wants to see if she can get the left breast to be closer in size to the right breast without the tightness in the upper pole which is noted at her incision line.    Review of Systems  Constitutional: Negative.   Eyes: Negative.   Respiratory: Negative.    Cardiovascular: Negative.   Gastrointestinal: Negative.   Endocrine: Negative.   Genitourinary: Negative.   Musculoskeletal: Negative.     Past Medical History:  Diagnosis Date   Breast CA (HCC) 09/07/2011   Breast cancer (HCC) 09/2008   DCIS   Breast cancer (HCC) 11/28/2020   Right Breast   Cancer (HCC)    lt breast ca 2010/surg-lumpectomy/rad tx   Endometriosis    H/O bilateral oophorectomy 03/19/2014   Iron  deficiency anemia    Iron  deficiency anemia 09/07/2011   PONV (postoperative nausea and vomiting)     Past Surgical History:  Procedure Laterality Date   ABDOMINAL HYSTERECTOMY  2010   BILATERAL OOPHORECTOMY Bilateral 2010   BREAST LUMPECTOMY WITH RADIOACTIVE SEED AND SENTINEL LYMPH NODE BIOPSY Right 01/26/2021   Procedure: RIGHT BREAST LUMPECTOMY WITH  RADIOACTIVE SEED AND SENTINEL LYMPH NODE BIOPSY;  Surgeon: Curvin Deward MOULD, MD;  Location: [facility];  Service: General;  Laterality: Right;   CESAREAN SECTION     CHOLECYSTECTOMY  2005   lap choli   COLONOSCOPY N/A 05/12/2015   Procedure: COLONOSCOPY;  Surgeon: Claudis RAYMOND Rivet, MD;  [facility];  Service: Endoscopy;  Laterality: N/A;  955 - moved to 1/12 @ 9:30   COLONOSCOPY N/A 08/04/2020   Procedure: COLONOSCOPY;  Surgeon: Rivet Claudis RAYMOND, MD;  [facility];  Service: Endoscopy;  Laterality: N/A;  am   DIAGNOSTIC LAPAROSCOPY  2004   incisional mass   LAPAROSCOPY  2006   LOA   LIPOSUCTION WITH LIPOFILLING Left 05/13/2014   Procedure: LIPOSUCTION WITH LIPOFILLING FOR SYMMETRY;  Surgeon: Estefana Reichert, DO;  Location: [facility];  Service: Plastics;  Laterality: Left;  liposuction from abdomen, lipofilling to left breast   lumpectomy  2010   left lump-snbx   POLYPECTOMY  08/04/2020   Procedure: POLYPECTOMY;  Surgeon: Rivet Claudis RAYMOND, MD;  [facility];  Service: Endoscopy;;      Current Outpatient Medications:    ALPRAZolam (XANAX) 0.5 MG tablet, Take 0.5 mg by mouth daily as needed for anxiety., Disp: , Rfl:    b complex vitamins capsule, Take 1 capsule by mouth daily.,  Disp: , Rfl:    Cholecalciferol (VITAMIN D -3 PO), Take 200 mcg by mouth., Disp: , Rfl:    letrozole  (FEMARA ) 2.5 MG tablet, Take 1 tablet (2.5 mg total) by mouth daily., Disp: 30 tablet, Rfl: 5   MAGNESIUM PO, Take 500 mcg by mouth., Disp: , Rfl:    Multiple Vitamins-Minerals (WOMENS MULTIVITAMIN PO), Take 1 tablet by mouth daily., Disp: , Rfl:    prochlorperazine  (COMPAZINE ) 10 MG tablet, Take 1 tablet (10 mg total) by mouth every 6 (six) hours as needed for nausea or vomiting., Disp: 30 tablet, Rfl: 6   Objective:   Vitals:   12/26/23 1209  BP: 127/82  Pulse: 88  SpO2: 100%    Physical Exam Cardiovascular:     Rate and Rhythm: Normal rate.      Pulses: Normal pulses.  Pulmonary:     Effort: Pulmonary effort is normal.  Chest:    Abdominal:     Palpations: Abdomen is soft.  Musculoskeletal:        General: No swelling or deformity.  Skin:    General: Skin is warm.     Capillary Refill: Capillary refill takes less than 2 seconds.     Coloration: Skin is not jaundiced.     Findings: No bruising.  Neurological:     Mental Status: She is oriented to person, place, and time.  Psychiatric:        Mood and Affect: Mood normal.        Thought Content: Thought content normal.     Assessment & Plan:  Malignant neoplasm of overlapping sites of right female breast, unspecified estrogen receptor status (HCC)  Breast asymmetry following reconstructive surgery  We talked about the different options of implants on the left breast with a reduction on the right breast.  Also fat grafting to the left side.  The patient would like to move forward with left breast fat grafting to see if we can get an improvement in the size and release of the contracture.  So plan will be left breast fat grafting and release of scar contracture.  Pictures were obtained of the patient and placed in the chart with the patient's or guardian's permission.   Estefana RAMAN Daveion Robar, DO

## 2024-01-01 ENCOUNTER — Other Ambulatory Visit: Payer: Self-pay | Admitting: *Deleted

## 2024-01-22 ENCOUNTER — Encounter: Admitting: Surgical

## 2024-01-22 NOTE — Progress Notes (Deleted)
 Patient ID: Veronica Dudley, female    DOB: 10/10/1965, 58 y.o.   MRN: [id number]  No chief complaint on file.   No diagnosis found.   History of Present Illness: Veronica Dudley is a 58 y.o.  female  with a history of ***.  She presents for preoperative evaluation for upcoming procedure, ***, scheduled for *** with Dr. {Aojwx:[id number]::Ujbonm,Ipoopwhyjf}.  The patient {HAS HAS [id number]} had problems with anesthesia. ***  Summary of Previous Visit: ***  Job: ***  PMH Significant for: ***   Past Medical History: Allergies: Allergies  Allergen Reactions   Cortisone Diarrhea and Other (See Comments)    Injection form:  caused my chest to get tight; gave diarrhea really bad     Current Medications:  Current Outpatient Medications:    ALPRAZolam (XANAX) 0.5 MG tablet, Take 0.5 mg by mouth daily as needed for anxiety., Disp: , Rfl:    b complex vitamins capsule, Take 1 capsule by mouth daily., Disp: , Rfl:    Cholecalciferol (VITAMIN D -3 PO), Take 200 mcg by mouth., Disp: , Rfl:    letrozole  (FEMARA ) 2.5 MG tablet, Take 1 tablet (2.5 mg total) by mouth daily., Disp: 30 tablet, Rfl: 5   MAGNESIUM PO, Take 500 mcg by mouth., Disp: , Rfl:    Multiple Vitamins-Minerals (WOMENS MULTIVITAMIN PO), Take 1 tablet by mouth daily., Disp: , Rfl:    prochlorperazine  (COMPAZINE ) 10 MG tablet, Take 1 tablet (10 mg total) by mouth every 6 (six) hours as needed for nausea or vomiting., Disp: 30 tablet, Rfl: 6  Past Medical Problems: Past Medical History:  Diagnosis Date   Breast CA (HCC) 09/07/2011   Breast cancer (HCC) 09/2008   DCIS   Breast cancer (HCC) 11/28/2020   Right Breast   Cancer (HCC)    lt breast ca 2010/surg-lumpectomy/rad tx   Endometriosis    H/O bilateral oophorectomy 03/19/2014   Iron  deficiency anemia    Iron  deficiency anemia 09/07/2011   PONV (postoperative nausea and vomiting)     Past Surgical History: Past Surgical History:  Procedure Laterality  Date   ABDOMINAL HYSTERECTOMY  2010   BILATERAL OOPHORECTOMY Bilateral 2010   BREAST LUMPECTOMY WITH RADIOACTIVE SEED AND SENTINEL LYMPH NODE BIOPSY Right 01/26/2021   Procedure: RIGHT BREAST LUMPECTOMY WITH RADIOACTIVE SEED AND SENTINEL LYMPH NODE BIOPSY;  Surgeon: Curvin Deward MOULD, MD;  Location: [facility];  Service: General;  Laterality: Right;   CESAREAN SECTION     CHOLECYSTECTOMY  2005   lap choli   COLONOSCOPY N/A 05/12/2015   Procedure: COLONOSCOPY;  Surgeon: Claudis RAYMOND Rivet, MD;  [facility];  Service: Endoscopy;  Laterality: N/A;  955 - moved to 1/12 @ 9:30   COLONOSCOPY N/A 08/04/2020   Procedure: COLONOSCOPY;  Surgeon: Rivet Claudis RAYMOND, MD;  [facility];  Service: Endoscopy;  Laterality: N/A;  am   DIAGNOSTIC LAPAROSCOPY  2004   incisional mass   LAPAROSCOPY  2006   LOA   LIPOSUCTION WITH LIPOFILLING Left 05/13/2014   Procedure: LIPOSUCTION WITH LIPOFILLING FOR SYMMETRY;  Surgeon: Estefana Reichert, DO;  Location: [facility];  Service: Plastics;  Laterality: Left;  liposuction from abdomen, lipofilling to left breast   lumpectomy  2010   left lump-snbx   POLYPECTOMY  08/04/2020   Procedure: POLYPECTOMY;  Surgeon: Rivet Claudis RAYMOND, MD;  [facility];  Service: Endoscopy;;    Social History: Social History   Socioeconomic History  Marital status: Married    Spouse name: Not on file   Number of children: Not on file   Years of education: Not on file   Highest education level: Not on file  Occupational History   Not on file  Tobacco Use   Smoking status: Former    Current packs/day: 0.00    Types: Cigarettes    Quit date: 05/06/1996    Years since quitting: 27.7   Smokeless tobacco: Never  Vaping Use   Vaping status: Never Used  Substance and Sexual Activity   Alcohol use: Yes    Alcohol/week: 2.0 standard drinks of alcohol    Types: 2 Glasses of wine per week    Comment: red wine, glass a night   Drug use:  No   Sexual activity: Yes  Other Topics Concern   Not on file  Social History Narrative   Not on file   Social Drivers of Health   Financial Resource Strain: Not on file  Food Insecurity: Not on file  Transportation Needs: Low Risk (04/25/2022)   Received from [facility])   Transportation    Has a lack of transportation kept you from medical appointments, meetings, work, or from getting things needed for daily living?: Not on file  Physical Activity: Not on file  Stress: Not on file  Social Connections: Not on file  Intimate Partner Violence: Not At Risk (01/10/2021)   Humiliation, Afraid, Rape, and Kick questionnaire    Fear of Current or Ex-Partner: No    Emotionally Abused: No    Physically Abused: No    Sexually Abused: No    Family History: Family History  Problem Relation Age of Onset   Heart attack Father    Colon cancer Maternal Aunt 42       possibly ovarian cancer instead of colon cancer    Review of Systems: ROS  Physical Exam: Vital Signs There were no vitals taken for this visit.  Physical Exam *** Constitutional:      General: Not in acute distress.    Appearance: Normal appearance. Not ill-appearing.  HENT:     Head: Normocephalic and atraumatic.  Eyes:     Pupils: Pupils are equal, round Neck:     Musculoskeletal: Normal range of motion.  Cardiovascular:     Rate and Rhythm: Normal rate    Pulses: Normal pulses.  Pulmonary:     Effort: Pulmonary effort is normal. No respiratory distress.  Abdominal:     General: Abdomen is flat. There is no distension.  Musculoskeletal: Normal range of motion.  Skin:    General: Skin is warm and dry.     Findings: No erythema or rash.  Neurological:     General: No focal deficit present.     Mental Status: Alert and oriented to person, place, and time. Mental status is at baseline.     Motor: No weakness.  Psychiatric:        Mood and Affect: Mood normal.        Behavior: Behavior  normal.    Assessment/Plan: The patient is scheduled for *** with Dr. {Aojwx:[id number]::Ujbonm,Ipoopwhyjf}.  Risks, benefits, and alternatives of procedure discussed, questions answered and consent obtained.    Smoking Status: ***; Counseling Given? *** Last Mammogram: ***; Results: ***  Caprini Score: ***; Risk Factors include: ***, BMI *** 25, and length of planned surgery. Recommendation for mechanical *** prophylaxis. Encourage early ambulation.   Pictures obtained: @consult ***  Post-op Rx sent to pharmacy: {  Blank:[id number]::Oxycodone , Zofran , Keflex ,Oxycodone , Zofran }  Patient was provided with the *** General Surgical Risk consent document and Pain Medication Agreement prior to their appointment.  They had adequate time to read through the risk consent documents and Pain Medication Agreement. We also discussed them in person together during this preop appointment. All of their questions were answered to their satisfaction.  Recommended calling if they have any further questions.  Risk consent form and Pain Medication Agreement to be scanned into patient's chart.  ***   Electronically signed by: Donnice PARAS Kynley Metzger, PA-C 01/22/2024 10:00 AM

## 2024-01-24 ENCOUNTER — Inpatient Hospital Stay: Admitting: Oncology

## 2024-01-24 NOTE — Assessment & Plan Note (Addendum)
 Possibly related to letrozole .  Discussed stopping Letrozole  for 30 days and following up in 1 month to see if sx have changed/improved.  Could switch to a different AI?

## 2024-01-24 NOTE — Assessment & Plan Note (Signed)
-   Lumpectomy on 10/22/2008, followed by XRT completed on 01/21/2009. - DCIS was intermediate grade, 2.7 cm, close to the chest wall, ER positive, PR negative.  2 mm of negative margins. - She refused antiestrogen therapy.

## 2024-01-24 NOTE — Assessment & Plan Note (Signed)
-   She required parenteral iron  therapy in the past. - Hemoglobin is 11.4.  Iron  saturation 33%.  Ferritin 52.

## 2024-01-24 NOTE — Progress Notes (Unsigned)
 [facility] OFFICE PROGRESS NOTE  Marvine Rush, MD  ASSESSMENT & PLAN:    Assessment & Plan Mood changes Possibly related to letrozole .  Discussed stopping Letrozole  for 30 days and following up in 1 month to see if sx have changed/improved.  Could switch to a different AI?   No orders of the defined types were placed in this encounter.   INTERVAL HISTORY: Patient returns for follow-up.  We reviewed ***  SUMMARY OF HEMATOLOGIC HISTORY: Oncology History  Ductal carcinoma in situ of breast  09/07/2011 Initial Diagnosis   Ductal carcinoma in situ of breast    Genetic Testing   The [facility] CancerNext-Expanded Panel was Negative. Report date is 02/22/2021.  The CancerNext-Expanded gene panel offered by [facility] and includes sequencing, rearrangement, and RNA analysis for the following 77 genes: AIP, ALK, APC, ATM, AXIN2, BAP1, BARD1, BLM, BMPR1A, BRCA1, BRCA2, BRIP1, CDC73, CDH1, CDK4, CDKN1B, CDKN2A, CHEK2, CTNNA1, DICER1, FANCC, FH, FLCN, GALNT12, KIF1B, LZTR1, MAX, MEN1, MET, MLH1, MSH2, MSH3, MSH6, MUTYH, NBN, NF1, NF2, NTHL1, PALB2, PHOX2B, PMS2, POT1, PRKAR1A, PTCH1, PTEN, RAD51C, RAD51D, RB1, RECQL, RET, SDHA, SDHAF2, SDHB, SDHC, SDHD, SMAD4, SMARCA4, SMARCB1, SMARCE1, STK11, SUFU, TMEM127, TP53, TSC1, TSC2, VHL and XRCC2 (sequencing and deletion/duplication); EGFR, EGLN1, HOXB13, KIT, MITF, PDGFRA, POLD1, and POLE (sequencing only); EPCAM and GREM1 (deletion/duplication only).    Malignant neoplasm of overlapping sites of right female breast (HCC)  02/16/2021 Genetic Testing   Oncotype DX:       CBC    Component Value Date/Time   WBC 4.3 12/17/2023 0939   RBC 4.11 12/17/2023 0939   HGB 11.4 (L) 12/17/2023 0939   HCT 36.1 12/17/2023 0939   PLT 237 12/17/2023 0939   MCV 87.8 12/17/2023 0939   MCH 27.7 12/17/2023 0939   MCHC 31.6 12/17/2023 0939   RDW 12.7 12/17/2023 0939   LYMPHSABS 1.5 12/17/2023 0939   MONOABS 0.2 12/17/2023 0939   EOSABS  0.1 12/17/2023 0939   BASOSABS 0.0 12/17/2023 0939       Latest Ref Rng & Units 12/17/2023    9:39 AM 05/09/2023    8:11 AM 10/30/2022    7:56 AM  CMP  Glucose 70 - 99 mg/dL 98  92  899   BUN 6 - 20 mg/dL 14  14  13    Creatinine 0.44 - 1.00 mg/dL 9.27  9.27  9.28   Sodium 135 - 145 mmol/L 142  137  141   Potassium 3.5 - 5.1 mmol/L 3.8  4.0  3.5   Chloride 98 - 111 mmol/L 106  102  106   CO2 22 - 32 mmol/L 24  25  27    Calcium 8.9 - 10.3 mg/dL 9.4  9.2  9.1   Total Protein 6.5 - 8.1 g/dL 7.5  7.8  7.1   Total Bilirubin 0.0 - 1.2 mg/dL 0.8  0.5  0.4   Alkaline Phos 38 - 126 U/L 79  68  52   AST 15 - 41 U/L 30  19  21    ALT 0 - 44 U/L 34  14  15      Lab Results  Component Value Date   FERRITIN 52 12/17/2023   VITAMINB12 416 12/28/2020    There were no vitals filed for this visit.  Review of System:  ROS  Physical Exam: Physical Exam   I spent *** minutes dedicated to the care of this patient (face-to-face and non-face-to-face) on the date of the encounter to include  what is described in the assessment and plan.,  Delon Hope, NP 01/24/2024 11:06 AM

## 2024-01-24 NOTE — Assessment & Plan Note (Signed)
-   Unable to tolerate anastrozole  due to nausea.  Started on Femara  and is only able to take it 3 days/week without nausea. Did not like how she felt while taking compazine .  -Diagnostic mammogram from 12/17/2023 was normal.   - RTC 6 months with labs a few days before.  -Screening mammogram in 1 year.

## 2024-01-28 ENCOUNTER — Inpatient Hospital Stay: Attending: Oncology | Admitting: Oncology

## 2024-01-28 DIAGNOSIS — R4586 Emotional lability: Secondary | ICD-10-CM

## 2024-01-28 NOTE — Progress Notes (Signed)
 [facility] OFFICE PROGRESS NOTE  Marvine Rush, MD  ASSESSMENT & PLAN:    I connected with Veronica Dudley on 03/23/24 at  9:45 AM EDT by telephone visit and verified that I am speaking with the correct person using two identifiers.   I discussed the limitations, risks, security and privacy concerns of performing an evaluation and management service by telemedicine and the availability of in-person appointments. I also discussed with the patient that there may be a patient responsible charge related to this service. The patient expressed understanding and agreed to proceed.   Other persons participating in the visit and their role in the encounter: NP, Patient    Patient's location: Home  Provider's location: Clinic  Assessment & Plan Mood changes Possibly related to letrozole .  Stop letrozole  for greater than 30 days to see if her symptoms improved. She is currently taking it every other day with improvement of her mood swings.  She would like to continue this instead of switching to a different aromatase inhibitor if possible.  We did discuss in detail that this is not how the medication is intended to be taken but okay to continue. Return to clinic as scheduled in February.  No orders of the defined types were placed in this encounter.   INTERVAL HISTORY: Patient returns for follow-up.  Patient was taken off of letrozole  about a month ago due to again changes in her mood.  Reports coming completely off of letrozole  and then restarting every other day with improvement of her mood swings.  She would like to continue every other day.  We did discuss that this is not how medication is intended to be taken but if she is tolerating we will continue at this time.   SUMMARY OF HEMATOLOGIC HISTORY: Oncology History  Ductal carcinoma in situ of breast  09/07/2011 Initial Diagnosis   Ductal carcinoma in situ of breast    Genetic Testing   The [facility] CancerNext-Expanded  Panel was Negative. Report date is 02/22/2021.  The CancerNext-Expanded gene panel offered by [facility] and includes sequencing, rearrangement, and RNA analysis for the following 77 genes: AIP, ALK, APC, ATM, AXIN2, BAP1, BARD1, BLM, BMPR1A, BRCA1, BRCA2, BRIP1, CDC73, CDH1, CDK4, CDKN1B, CDKN2A, CHEK2, CTNNA1, DICER1, FANCC, FH, FLCN, GALNT12, KIF1B, LZTR1, MAX, MEN1, MET, MLH1, MSH2, MSH3, MSH6, MUTYH, NBN, NF1, NF2, NTHL1, PALB2, PHOX2B, PMS2, POT1, PRKAR1A, PTCH1, PTEN, RAD51C, RAD51D, RB1, RECQL, RET, SDHA, SDHAF2, SDHB, SDHC, SDHD, SMAD4, SMARCA4, SMARCB1, SMARCE1, STK11, SUFU, TMEM127, TP53, TSC1, TSC2, VHL and XRCC2 (sequencing and deletion/duplication); EGFR, EGLN1, HOXB13, KIT, MITF, PDGFRA, POLD1, and POLE (sequencing only); EPCAM and GREM1 (deletion/duplication only).    Malignant neoplasm of overlapping sites of right female breast (HCC)  02/16/2021 Genetic Testing   Oncotype DX:       CBC    Component Value Date/Time   WBC 4.3 12/17/2023 0939   RBC 4.11 12/17/2023 0939   HGB 11.4 (L) 12/17/2023 0939   HCT 36.1 12/17/2023 0939   PLT 237 12/17/2023 0939   MCV 87.8 12/17/2023 0939   MCH 27.7 12/17/2023 0939   MCHC 31.6 12/17/2023 0939   RDW 12.7 12/17/2023 0939   LYMPHSABS 1.5 12/17/2023 0939   MONOABS 0.2 12/17/2023 0939   EOSABS 0.1 12/17/2023 0939   BASOSABS 0.0 12/17/2023 0939       Latest Ref Rng & Units 12/17/2023    9:39 AM 05/09/2023    8:11 AM 10/30/2022    7:56 AM  CMP  Glucose 70 - 99 mg/dL 98  92  899   BUN 6 - 20 mg/dL 14  14  13    Creatinine 0.44 - 1.00 mg/dL 9.27  9.27  9.28   Sodium 135 - 145 mmol/L 142  137  141   Potassium 3.5 - 5.1 mmol/L 3.8  4.0  3.5   Chloride 98 - 111 mmol/L 106  102  106   CO2 22 - 32 mmol/L 24  25  27    Calcium 8.9 - 10.3 mg/dL 9.4  9.2  9.1   Total Protein 6.5 - 8.1 g/dL 7.5  7.8  7.1   Total Bilirubin 0.0 - 1.2 mg/dL 0.8  0.5  0.4   Alkaline Phos 38 - 126 U/L 79  68  52   AST 15 - 41 U/L 30  19  21    ALT 0 - 44 U/L  34  14  15      Lab Results  Component Value Date   FERRITIN 52 12/17/2023   VITAMINB12 416 12/28/2020    There were no vitals filed for this visit.  Review of System:  Review of Systems  Constitutional:  Positive for malaise/fatigue.  Psychiatric/Behavioral:         Improvement of mood swings    Physical Exam: Physical Exam Neurological:     Mental Status: She is alert and oriented to person, place, and time.     I provided 18 minutes of non face-to-face telephone visit time during this encounter, and > 50% was spent counseling as documented under my assessment & plan.  Delon Hope, NP 03/23/2024 10:26 AM

## 2024-01-28 NOTE — Assessment & Plan Note (Addendum)
 Possibly related to letrozole .  Stop letrozole  for greater than 30 days to see if her symptoms improved. She is currently taking it every other day with improvement of her mood swings.  She would like to continue this instead of switching to a different aromatase inhibitor if possible.  We did discuss in detail that this is not how the medication is intended to be taken but okay to continue. Return to clinic as scheduled in February.

## 2024-01-30 ENCOUNTER — Encounter: Payer: Self-pay | Admitting: Surgical

## 2024-01-30 ENCOUNTER — Ambulatory Visit (INDEPENDENT_AMBULATORY_CARE_PROVIDER_SITE_OTHER): Admitting: Surgical

## 2024-01-30 VITALS — BP 131/84 | HR 81 | Wt 161.0 lb

## 2024-01-30 DIAGNOSIS — N651 Disproportion of reconstructed breast: Secondary | ICD-10-CM | POA: Diagnosis not present

## 2024-01-30 DIAGNOSIS — Z853 Personal history of malignant neoplasm of breast: Secondary | ICD-10-CM | POA: Diagnosis not present

## 2024-01-30 DIAGNOSIS — Z923 Personal history of irradiation: Secondary | ICD-10-CM | POA: Diagnosis not present

## 2024-01-30 DIAGNOSIS — C50811 Malignant neoplasm of overlapping sites of right female breast: Secondary | ICD-10-CM

## 2024-01-30 NOTE — Progress Notes (Signed)
 Patient ID: Veronica Dudley, female    DOB: 09-09-1965, 58 y.o.   MRN: [id number]  Chief Complaint  Patient presents with   Pre-op Exam      ICD-10-CM   1. Malignant neoplasm of overlapping sites of right female breast, unspecified estrogen receptor status (HCC)  C50.811     2. Breast asymmetry following reconstructive surgery  N65.1       History of Present Illness: Veronica Dudley is a 58 y.o.  female  with a history of left breast lumpectomy after DCIS and radiation to left breast.  She presents for preoperative evaluation for upcoming procedure, release of LEFT breast contracture and fat grafting, scheduled for 02/10/24 with Dr. Lowery.  The patient has not had problems with anesthesia. No history of DVT/PE.  No family history of DVT/PE.  No family or personal history of bleeding or clotting disorders.  Patient is not currently taking any blood thinners.  No history of CVA/MI.  Patient has had a history of some nausea after anesthesia.  Patient denies any history of Crohn's or ulcerative colitis.  No history of any COPD or asthma.  She does have a history of DCIS, but reports no other forms of cancer/precancer.  She does not report any changes in her health in the last month.  Summary of Previous Visit: She had an area of concern in the left breast over 10 years ago. The patient stated that it was not cancer but she did go through 2 weeks of radiation. She then had cancer in 2022 that was biopsied and came back as invasive ductal carcinoma. It was estrogen positive and progesterone negative. The note indicates that the left breast had DCIS and was treated with radiation which ended 12 years ago. Asymmetry with the right breast being about a cup size larger than the left. This makes it difficult for her to fit into close. She has had additional surgeries as listed below. She does not want to make the right breast any smaller. She wants to see if she can get the left breast to be  closer in size to the right breast without the tightness in the upper pole which is noted at her incision line.  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  Discussed lifting restrictions.  PMH Significant for: DCIS of LEFT breast - with hx of radiation therapy. Iron  deficiency anemia (well controlled at this time), PONV.  Recent labs: CMP normal, Vit D normal, CBC overall normal - slightly low hgb at 11.4, but her labs from 1-4 years ago show on avg around 11.  Patient does report that she has some questions about upcoming procedure.  She has concerns about whether or not she should move forward with fat grafting versus having an implant placed.  She reports she has done some research and watch some videos on YouTube and is interested in discussing further why that grafting would be recommended over implant.  She also reports that she is hoping that both of her breasts after surgery will look the same.  She is mostly concerned about the volume loss/indentation that she notices when leaning forward.  She also has concerns about the way her left nipple looks due to the lumpectomy.  Past Medical History: Allergies: Allergies  Allergen Reactions   Cortisone Diarrhea and Other (See Comments)    Injection form:  caused my chest to get tight; gave diarrhea really bad  Current Medications:  Current Outpatient Medications:    ALPRAZolam (XANAX) 0.5 MG tablet, Take 0.5 mg by mouth daily as needed for anxiety., Disp: , Rfl:    b complex vitamins capsule, Take 1 capsule by mouth daily., Disp: , Rfl:    Cholecalciferol (VITAMIN D -3 PO), Take 200 mcg by mouth., Disp: , Rfl:    letrozole  (FEMARA ) 2.5 MG tablet, Take 1 tablet (2.5 mg total) by mouth daily., Disp: 30 tablet, Rfl: 5   MAGNESIUM PO, Take 500 mcg by mouth., Disp: , Rfl:    Multiple Vitamins-Minerals (WOMENS MULTIVITAMIN PO), Take 1 tablet by mouth daily., Disp: , Rfl:    prochlorperazine   (COMPAZINE ) 10 MG tablet, Take 1 tablet (10 mg total) by mouth every 6 (six) hours as needed for nausea or vomiting., Disp: 30 tablet, Rfl: 6  Past Medical Problems: Past Medical History:  Diagnosis Date   Breast CA (HCC) 09/07/2011   Breast cancer (HCC) 09/2008   DCIS   Breast cancer (HCC) 11/28/2020   Right Breast   Cancer (HCC)    lt breast ca 2010/surg-lumpectomy/rad tx   Endometriosis    H/O bilateral oophorectomy 03/19/2014   Iron  deficiency anemia    Iron  deficiency anemia 09/07/2011   PONV (postoperative nausea and vomiting)     Past Surgical History: Past Surgical History:  Procedure Laterality Date   ABDOMINAL HYSTERECTOMY  2010   BILATERAL OOPHORECTOMY Bilateral 2010   BREAST LUMPECTOMY WITH RADIOACTIVE SEED AND SENTINEL LYMPH NODE BIOPSY Right 01/26/2021   Procedure: RIGHT BREAST LUMPECTOMY WITH RADIOACTIVE SEED AND SENTINEL LYMPH NODE BIOPSY;  Surgeon: Curvin Deward MOULD, MD;  Location: [facility];  Service: General;  Laterality: Right;   CESAREAN SECTION     CHOLECYSTECTOMY  2005   lap choli   COLONOSCOPY N/A 05/12/2015   Procedure: COLONOSCOPY;  Surgeon: Claudis RAYMOND Rivet, MD;  Location: [facility];  Service: Endoscopy;  Laterality: N/A;  955 - moved to 1/12 @ 9:30   COLONOSCOPY N/A 08/04/2020   Procedure: COLONOSCOPY;  Surgeon: Rivet Claudis RAYMOND, MD;  Location: [facility];  Service: Endoscopy;  Laterality: N/A;  am   DIAGNOSTIC LAPAROSCOPY  2004   incisional mass   LAPAROSCOPY  2006   LOA   LIPOSUCTION WITH LIPOFILLING Left 05/13/2014   Procedure: LIPOSUCTION WITH LIPOFILLING FOR SYMMETRY;  Surgeon: Estefana Reichert, DO;  Location:  SURGERY CENTER;  Service: Plastics;  Laterality: Left;  liposuction from abdomen, lipofilling to left breast   lumpectomy  2010   left lump-snbx   POLYPECTOMY  08/04/2020   Procedure: POLYPECTOMY;  Surgeon: Rivet Claudis RAYMOND, MD;  Location: [facility];  Service: Endoscopy;;    Social History: Social History    Socioeconomic History   Marital status: Married    Spouse name: Not on file   Number of children: Not on file   Years of education: Not on file   Highest education level: Not on file  Occupational History   Not on file  Tobacco Use   Smoking status: Former    Current packs/day: 0.00    Types: Cigarettes    Quit date: 05/06/1996    Years since quitting: 27.7   Smokeless tobacco: Never  Vaping Use   Vaping status: Never Used  Substance and Sexual Activity   Alcohol use: Yes    Alcohol/week: 2.0 standard drinks of alcohol    Types: 2 Glasses of wine per week    Comment: red wine, glass a night   Drug  use: No   Sexual activity: Yes  Other Topics Concern   Not on file  Social History Narrative   Not on file   Social Drivers of Health   Financial Resource Strain: Not on file  Food Insecurity: Not on file  Transportation Needs: Low Risk (04/25/2022)   Received [facility])   Transportation    Has a lack of transportation kept you from medical appointments, meetings, work, or from getting things needed for daily living?: Not on file  Physical Activity: Not on file  Stress: Not on file  Social Connections: Not on file  Intimate Partner Violence: Not At Risk (01/10/2021)   Humiliation, Afraid, Rape, and Kick questionnaire    Fear of Current or Ex-Partner: No    Emotionally Abused: No    Physically Abused: No    Sexually Abused: No    Family History: Family History  Problem Relation Age of Onset   Heart attack Father    Colon cancer Maternal Aunt 42       possibly ovarian cancer instead of colon cancer    Review of Systems: Review of Systems  Constitutional: Negative.   Respiratory: Negative.    Cardiovascular: Negative.   Gastrointestinal: Negative.   Neurological: Negative.     Physical Exam: Vital Signs BP 131/84 (BP Location: Left Arm, Patient Position: Sitting, Cuff Size: Normal)   Pulse 81   Wt 161 lb (73 kg)   SpO2 100%   BMI  24.48 kg/m   Physical Exam Constitutional:      General: Not in acute distress.    Appearance: Normal appearance. Not ill-appearing.  HENT:     Head: Normocephalic and atraumatic.  Eyes:     Pupils: Pupils are equal, round Neck:     Musculoskeletal: Normal range of motion.  Cardiovascular:     Rate and Rhythm: Normal rate    Pulses: Normal pulses.  Pulmonary:     Effort: Pulmonary effort is normal. No respiratory distress.  Abdominal:     General: Abdomen is flat. There is no distension.  Musculoskeletal: Normal range of motion.  Skin:    General: Skin is warm and dry.     Findings: No erythema or rash.  Neurological:     General: No focal deficit present.     Mental Status: Alert and oriented to person, place, and time. Mental status is at baseline.     Motor: No weakness.  Psychiatric:        Mood and Affect: Mood normal.        Behavior: Behavior normal.    Assessment/Plan: The patient is scheduled for release of left breast contracture and fat grafting liposuction of abdomen.  With Dr. Lowery.  Risks, benefits, and alternatives of procedure discussed, questions answered and consent obtained.    Smoking Status: Patient reports she is a non-smoker; Counseling Given?  N/A Last Mammogram: 12/17/2023; Results: BI-RADS 2, benign  Caprini Score: 5; Risk Factors include: Age, hx of DCIS of L breast and length of planned surgery. Recommendation for mechanical prophylaxis. Encourage early ambulation.   Pictures obtained: @consult   Post-op Rx sent to pharmacy: No prescription sent at this time, will send prescriptions once surgery has been confirmed.  Once confirmed, will send tramadol, Zofran , Keflex .  Patient was provided with the General Surgical Risk consent document and Pain Medication Agreement prior to their appointment.  They had adequate time to read through the risk consent documents and Pain Medication Agreement. We also discussed them  in person together during  this preop appointment. All of their questions were answered to their satisfaction.  Recommended calling if they have any further questions.  Risk consent form and Pain Medication Agreement to be scanned into patient's chart.  Discussed with patient the risks of liposuction, discussed with patient fat grafting risks including failure of the grafted fat.  Fat necrosis was discussed.  We discussed increased risk of complications due to history of radiation.  Patient did have a lot of questions today about upcoming surgery and had some questions about implant placement versus fat grafting.  We discussed some of her questions today, however I think it is important for her to discuss these questions again with Dr. Lowery prior to surgery.  We have scheduled her for a telephone visit tomorrow.  Patient is medically optimized from a plastic surgery standpoint and is cleared to proceed with surgery.    Electronically signed by: Donnice PARAS Ananias Kolander, PA-C 01/30/2024 2:52 PM

## 2024-01-31 ENCOUNTER — Ambulatory Visit (INDEPENDENT_AMBULATORY_CARE_PROVIDER_SITE_OTHER): Admitting: Plastic Surgery

## 2024-01-31 DIAGNOSIS — C50811 Malignant neoplasm of overlapping sites of right female breast: Secondary | ICD-10-CM

## 2024-01-31 DIAGNOSIS — Z923 Personal history of irradiation: Secondary | ICD-10-CM

## 2024-01-31 DIAGNOSIS — N651 Disproportion of reconstructed breast: Secondary | ICD-10-CM

## 2024-01-31 NOTE — Addendum Note (Signed)
 Addended by: Lamone Ferrelli on: 01/31/2024 09:45 AM   Modules accepted: Level of Service

## 2024-01-31 NOTE — H&P (View-Only) (Signed)
   Subjective:    Patient ID: Veronica Dudley, female    DOB: 1965-06-09, 58 y.o.   MRN: [id number]  The patient is a 58 year old female joining me by phone.  As far as I know she is in [place]  and I am at the office.  She had some questions about the surgery.  In particular she wanted to know if the fat grafting was good to be a better option for her than the implant.      Review of Systems  Constitutional: Negative.   Eyes: Negative.   Respiratory: Negative.    Cardiovascular: Negative.   Gastrointestinal: Negative.        Objective:   Physical Exam       Assessment & Plan:     ICD-10-CM   1. Malignant neoplasm of overlapping sites of right female breast, unspecified estrogen receptor status (HCC)  C50.811     2. Breast asymmetry following reconstructive surgery  N65.1        I explained that I felt like the implant might make her nipple location look worse because the implant would go under the nipple areola and therefore the nipple could point more superiorly than it even is now.  The fat grafting going above the nipple on the superior border will hopefully help lower the nipple and decrease the damage from the radiation.  The implant will not improve the radiation at all.  The patient would like to move ahead with our plan for fat grafting.  We spent 5 minutes in discussion.  I connected with  Veronica Dudley on 01/31/24 by phone and verified that I am speaking with the correct person using two identifiers.   I discussed the limitations of evaluation and management by telemedicine. The patient expressed understanding and agreed to proceed.

## 2024-01-31 NOTE — Progress Notes (Signed)
   Subjective:    Patient ID: Veronica Dudley, female    DOB: 1965-06-09, 58 y.o.   MRN: [id number]  The patient is a 58 year old female joining me by phone.  As far as I know she is in [place]  and I am at the office.  She had some questions about the surgery.  In particular she wanted to know if the fat grafting was good to be a better option for her than the implant.      Review of Systems  Constitutional: Negative.   Eyes: Negative.   Respiratory: Negative.    Cardiovascular: Negative.   Gastrointestinal: Negative.        Objective:   Physical Exam       Assessment & Plan:     ICD-10-CM   1. Malignant neoplasm of overlapping sites of right female breast, unspecified estrogen receptor status (HCC)  C50.811     2. Breast asymmetry following reconstructive surgery  N65.1        I explained that I felt like the implant might make her nipple location look worse because the implant would go under the nipple areola and therefore the nipple could point more superiorly than it even is now.  The fat grafting going above the nipple on the superior border will hopefully help lower the nipple and decrease the damage from the radiation.  The implant will not improve the radiation at all.  The patient would like to move ahead with our plan for fat grafting.  We spent 5 minutes in discussion.  I connected with  Veronica Dudley on 01/31/24 by phone and verified that I am speaking with the correct person using two identifiers.   I discussed the limitations of evaluation and management by telemedicine. The patient expressed understanding and agreed to proceed.

## 2024-02-06 ENCOUNTER — Encounter (HOSPITAL_BASED_OUTPATIENT_CLINIC_OR_DEPARTMENT_OTHER): Payer: Self-pay | Admitting: Plastic Surgery

## 2024-02-10 ENCOUNTER — Ambulatory Visit (HOSPITAL_BASED_OUTPATIENT_CLINIC_OR_DEPARTMENT_OTHER)

## 2024-02-10 ENCOUNTER — Other Ambulatory Visit: Payer: Self-pay

## 2024-02-10 ENCOUNTER — Encounter (HOSPITAL_BASED_OUTPATIENT_CLINIC_OR_DEPARTMENT_OTHER)
Admission: RE | Disposition: A | Discharge status: Home / Self Care | Payer: Self-pay | Source: Home / Self Care | Attending: Plastic Surgery

## 2024-02-10 ENCOUNTER — Ambulatory Visit (HOSPITAL_BASED_OUTPATIENT_CLINIC_OR_DEPARTMENT_OTHER)
Admission: RE | Admit: 2024-02-10 | Discharge: 2024-02-10 | Disposition: A | Discharge status: Home / Self Care | Attending: Plastic Surgery | Admitting: Plastic Surgery

## 2024-02-10 ENCOUNTER — Encounter (HOSPITAL_BASED_OUTPATIENT_CLINIC_OR_DEPARTMENT_OTHER): Payer: Self-pay | Admitting: Plastic Surgery

## 2024-02-10 ENCOUNTER — Telehealth: Payer: Self-pay | Admitting: Plastic Surgery

## 2024-02-10 DIAGNOSIS — N651 Disproportion of reconstructed breast: Secondary | ICD-10-CM | POA: Insufficient documentation

## 2024-02-10 DIAGNOSIS — Z87891 Personal history of nicotine dependence: Secondary | ICD-10-CM | POA: Insufficient documentation

## 2024-02-10 DIAGNOSIS — Z853 Personal history of malignant neoplasm of breast: Secondary | ICD-10-CM | POA: Diagnosis not present

## 2024-02-10 DIAGNOSIS — Z9012 Acquired absence of left breast and nipple: Secondary | ICD-10-CM | POA: Diagnosis not present

## 2024-02-10 DIAGNOSIS — Z01818 Encounter for other preprocedural examination: Secondary | ICD-10-CM

## 2024-02-10 SURGERY — CAPSULECTOMY, BREAST
Anesthesia: General | Site: Breast | Laterality: Left

## 2024-02-10 MED ORDER — FENTANYL CITRATE (PF) 100 MCG/2ML IJ SOLN
INTRAMUSCULAR | Status: AC
  Filled 2024-02-10: qty 2

## 2024-02-10 MED ORDER — LIDOCAINE 2% (20 MG/ML) 5 ML SYRINGE
INTRAMUSCULAR | Status: DC | PRN
  Administered 2024-02-10: 80 mg via INTRAVENOUS

## 2024-02-10 MED ORDER — FENTANYL CITRATE (PF) 100 MCG/2ML IJ SOLN: 25.0000 ug | INTRAMUSCULAR | Status: DC | PRN

## 2024-02-10 MED ORDER — LACTATED RINGERS IV SOLN
INTRAVENOUS | Status: AC | PRN
Start: 2024-02-10 — End: 2024-02-10
  Administered 2024-02-10 (×2): 1000 mL

## 2024-02-10 MED ORDER — ACETAMINOPHEN 325 MG PO TABS: 650.0000 mg | ORAL_TABLET | ORAL | Status: DC | PRN

## 2024-02-10 MED ORDER — OXYCODONE HCL 5 MG/5ML PO SOLN: 5.0000 mg | Freq: Once | ORAL | Status: AC | PRN

## 2024-02-10 MED ORDER — SODIUM CHLORIDE 0.9% FLUSH: 3.0000 mL | Freq: Two times a day (BID) | INTRAVENOUS | Status: DC

## 2024-02-10 MED ORDER — ACETAMINOPHEN 325 MG RE SUPP: 650.0000 mg | RECTAL | Status: DC | PRN

## 2024-02-10 MED ORDER — MIDAZOLAM HCL 2 MG/2ML IJ SOLN
INTRAMUSCULAR | Status: DC | PRN
  Administered 2024-02-10: 2 mg via INTRAVENOUS

## 2024-02-10 MED ORDER — OXYCODONE HCL 5 MG PO TABS
5.0000 mg | ORAL_TABLET | Freq: Once | ORAL | Status: AC | PRN
  Administered 2024-02-10: 5 mg via ORAL

## 2024-02-10 MED ORDER — OXYCODONE HCL 5 MG PO TABS
ORAL_TABLET | ORAL | Status: AC
  Filled 2024-02-10: qty 1

## 2024-02-10 MED ORDER — ACETAMINOPHEN 500 MG PO TABS
ORAL_TABLET | ORAL | Status: AC
  Filled 2024-02-10: qty 2

## 2024-02-10 MED ORDER — FENTANYL CITRATE (PF) 100 MCG/2ML IJ SOLN
INTRAMUSCULAR | Status: DC | PRN
  Administered 2024-02-10: 100 ug via INTRAVENOUS

## 2024-02-10 MED ORDER — CEFAZOLIN SODIUM-DEXTROSE 2-4 GM/100ML-% IV SOLN: 2.0000 g | INTRAVENOUS | Status: DC

## 2024-02-10 MED ORDER — ONDANSETRON HCL 4 MG PO TABS: 4.0000 mg | ORAL_TABLET | Freq: Three times a day (TID) | ORAL | 0 refills | Status: DC | PRN

## 2024-02-10 MED ORDER — FENTANYL CITRATE (PF) 100 MCG/2ML IJ SOLN
25.0000 ug | INTRAMUSCULAR | Status: DC | PRN
  Administered 2024-02-10: 50 ug via INTRAVENOUS
  Administered 2024-02-10: 25 ug via INTRAVENOUS

## 2024-02-10 MED ORDER — DROPERIDOL 2.5 MG/ML IJ SOLN: 0.6250 mg | Freq: Once | INTRAMUSCULAR | Status: DC | PRN

## 2024-02-10 MED ORDER — KETAMINE HCL 50 MG/5ML IJ SOSY
PREFILLED_SYRINGE | INTRAMUSCULAR | Status: DC | PRN
  Administered 2024-02-10: 50 mg via INTRAVENOUS

## 2024-02-10 MED ORDER — ACETAMINOPHEN 10 MG/ML IV SOLN: 1000.0000 mg | Freq: Once | INTRAVENOUS | Status: DC | PRN

## 2024-02-10 MED ORDER — DEXMEDETOMIDINE HCL IN NACL 80 MCG/20ML IV SOLN
INTRAVENOUS | Status: DC | PRN
  Administered 2024-02-10 (×2): 4 ug via INTRAVENOUS

## 2024-02-10 MED ORDER — CEPHALEXIN 500 MG PO CAPS: 500.0000 mg | ORAL_CAPSULE | Freq: Four times a day (QID) | ORAL | 0 refills | Status: AC

## 2024-02-10 MED ORDER — PROPOFOL 10 MG/ML IV BOLUS
INTRAVENOUS | Status: DC | PRN
  Administered 2024-02-10: 150 ug via INTRAVENOUS

## 2024-02-10 MED ORDER — OXYCODONE HCL 5 MG PO TABS: 5.0000 mg | ORAL_TABLET | ORAL | Status: DC | PRN

## 2024-02-10 MED ORDER — KETAMINE HCL 50 MG/5ML IJ SOSY
PREFILLED_SYRINGE | INTRAMUSCULAR | Status: AC
  Filled 2024-02-10: qty 5

## 2024-02-10 MED ORDER — LIDOCAINE HCL 1 % IJ SOLN
INTRAVENOUS | Status: DC | PRN
  Administered 2024-02-10: 350 mL

## 2024-02-10 MED ORDER — OXYCODONE HCL 5 MG PO CAPS: 5.0000 mg | ORAL_CAPSULE | Freq: Four times a day (QID) | ORAL | 0 refills | Status: AC | PRN

## 2024-02-10 MED ORDER — MIDAZOLAM HCL 2 MG/2ML IJ SOLN
INTRAMUSCULAR | Status: AC
  Filled 2024-02-10: qty 2

## 2024-02-10 MED ORDER — SODIUM CHLORIDE 0.9% FLUSH: 3.0000 mL | INTRAVENOUS | Status: DC | PRN

## 2024-02-10 MED ORDER — ONDANSETRON HCL 4 MG/2ML IJ SOLN
INTRAMUSCULAR | Status: DC | PRN
  Administered 2024-02-10: 4 mg via INTRAVENOUS

## 2024-02-10 MED ORDER — 0.9 % SODIUM CHLORIDE (POUR BTL) OPTIME
TOPICAL | Status: DC | PRN
  Administered 2024-02-10: 1000 mL

## 2024-02-10 MED ORDER — SODIUM CHLORIDE 0.9 % IV SOLN: 250.0000 mL | INTRAVENOUS | Status: DC | PRN

## 2024-02-10 MED ORDER — CEFAZOLIN SODIUM-DEXTROSE 2-3 GM-%(50ML) IV SOLR
INTRAVENOUS | Status: DC | PRN
  Administered 2024-02-10: 2 g via INTRAVENOUS

## 2024-02-10 MED ORDER — CHLORHEXIDINE GLUCONATE CLOTH 2 % EX PADS: 6.0000 | MEDICATED_PAD | Freq: Once | CUTANEOUS | Status: DC

## 2024-02-10 MED ORDER — ACETAMINOPHEN 500 MG PO TABS
1000.0000 mg | ORAL_TABLET | Freq: Once | ORAL | Status: AC
  Administered 2024-02-10: 1000 mg via ORAL

## 2024-02-10 MED ORDER — CEFAZOLIN SODIUM-DEXTROSE 2-4 GM/100ML-% IV SOLN
INTRAVENOUS | Status: AC
  Filled 2024-02-10: qty 100

## 2024-02-10 MED ORDER — LIDOCAINE-EPINEPHRINE 1 %-1:100000 IJ SOLN
INTRAMUSCULAR | Status: DC | PRN
  Administered 2024-02-10: 7 mL

## 2024-02-10 MED ORDER — LACTATED RINGERS IV SOLN
INTRAVENOUS | Status: DC
Start: 2024-02-10 — End: 2024-02-10

## 2024-02-10 MED ORDER — PROPOFOL 500 MG/50ML IV EMUL
INTRAVENOUS | Status: DC | PRN
Start: 2024-02-10 — End: 2024-02-10
  Administered 2024-02-10: 75 ug/kg/min via INTRAVENOUS
  Administered 2024-02-10: 50 ug/kg/min via INTRAVENOUS

## 2024-02-10 SURGICAL SUPPLY — 68 items
BAG DECANTER FOR FLEXI CONT (MISCELLANEOUS) ×1 IMPLANT
BINDER ABDOMINAL 10 UNV 27-48 (MISCELLANEOUS) IMPLANT
BINDER ABDOMINAL 12 SM 30-45 (SOFTGOODS) IMPLANT
BINDER ABDOMINAL 9 SM 30-45 (SOFTGOODS) IMPLANT
BINDER BREAST LRG (GAUZE/BANDAGES/DRESSINGS) IMPLANT
BINDER BREAST MEDIUM (GAUZE/BANDAGES/DRESSINGS) IMPLANT
BINDER BREAST XLRG (GAUZE/BANDAGES/DRESSINGS) IMPLANT
BINDER BREAST XXLRG (GAUZE/BANDAGES/DRESSINGS) IMPLANT
BLADE HEX COATED 2.75 (ELECTRODE) ×1 IMPLANT
BLADE SURG 10 STRL SS (BLADE) IMPLANT
BLADE SURG 15 STRL LF DISP TIS (BLADE) ×2 IMPLANT
BNDG GAUZE DERMACEA FLUFF 4 (GAUZE/BANDAGES/DRESSINGS) ×2 IMPLANT
CANISTER SUCT 1200ML W/VALVE (MISCELLANEOUS) ×1 IMPLANT
COVER BACK TABLE 60X90IN (DRAPES) ×1 IMPLANT
COVER MAYO STAND STRL (DRAPES) ×1 IMPLANT
DERMABOND ADVANCED .7 DNX12 (GAUZE/BANDAGES/DRESSINGS) ×1 IMPLANT
DRAIN CHANNEL 19F RND (DRAIN) IMPLANT
DRAPE LAPAROSCOPIC ABDOMINAL (DRAPES) ×1 IMPLANT
DRAPE UTILITY XL STRL (DRAPES) ×1 IMPLANT
DRESSING MEPILEX FLEX 4X4 (GAUZE/BANDAGES/DRESSINGS) IMPLANT
ELECT BLADE 6.5 EXT (BLADE) IMPLANT
ELECTRODE BLDE 4.0 EZ CLN MEGD (MISCELLANEOUS) IMPLANT
ELECTRODE REM PT RTRN 9FT ADLT (ELECTROSURGICAL) ×1 IMPLANT
EVACUATOR SILICONE 100CC (DRAIN) IMPLANT
EXTRACTOR CANIST REVOLVE STRL (CANNISTER) ×1 IMPLANT
GAUZE PAD ABD 8X10 STRL (GAUZE/BANDAGES/DRESSINGS) ×2 IMPLANT
GLOVE BIO SURGEON STRL SZ 6.5 (GLOVE) ×2 IMPLANT
GLOVE BIO SURGEON STRL SZ7.5 (GLOVE) IMPLANT
GLOVE BIOGEL PI IND STRL 7.0 (GLOVE) IMPLANT
GLOVE BIOGEL PI IND STRL 8 (GLOVE) IMPLANT
GOWN STRL REUS W/ TWL LRG LVL3 (GOWN DISPOSABLE) ×2 IMPLANT
GOWN STRL REUS W/ TWL XL LVL3 (GOWN DISPOSABLE) IMPLANT
IV LACTATED RINGERS 1000ML (IV SOLUTION) ×2 IMPLANT
KIT FILL ASEPTIC TRANSFER (MISCELLANEOUS) IMPLANT
LINER CANISTER 1000CC FLEX (MISCELLANEOUS) ×1 IMPLANT
NDL HYPO 25X1 1.5 SAFETY (NEEDLE) ×1 IMPLANT
NEEDLE HYPO 25X1 1.5 SAFETY (NEEDLE) ×1 IMPLANT
NS IRRIG 1000ML POUR BTL (IV SOLUTION) ×1 IMPLANT
PACK BASIN DAY SURGERY FS (CUSTOM PROCEDURE TRAY) ×1 IMPLANT
PAD ALCOHOL SWAB (MISCELLANEOUS) ×1 IMPLANT
PAD FOAM SILICONE BACKED (GAUZE/BANDAGES/DRESSINGS) ×1 IMPLANT
PENCIL SMOKE EVACUATOR (MISCELLANEOUS) ×1 IMPLANT
PIN SAFETY STERILE (MISCELLANEOUS) IMPLANT
SLEEVE SCD COMPRESS KNEE MED (STOCKING) ×1 IMPLANT
SPIKE FLUID TRANSFER (MISCELLANEOUS) IMPLANT
SPONGE T-LAP 18X18 ~~LOC~~+RFID (SPONGE) ×2 IMPLANT
STAPLER SKIN PROX WIDE 3.9 (STAPLE) IMPLANT
STRIP CLOSURE SKIN 1/2X4 (GAUZE/BANDAGES/DRESSINGS) IMPLANT
STRIP SUTURE WOUND CLOSURE 1/2 (MISCELLANEOUS) ×1 IMPLANT
SUT MNCRL AB 4-0 PS2 18 (SUTURE) ×1 IMPLANT
SUT MON AB 3-0 SH27 (SUTURE) ×1 IMPLANT
SUT MON AB 5-0 PS2 18 (SUTURE) ×2 IMPLANT
SUT PDS II 3-0 CT2 27 ABS (SUTURE) IMPLANT
SUT VIC AB 3-0 SH 27X BRD (SUTURE) IMPLANT
SUT VIC AB 4-0 PS2 18 (SUTURE) IMPLANT
SYR 10ML LL (SYRINGE) ×5 IMPLANT
SYR 3ML 18GX1 1/2 (SYRINGE) IMPLANT
SYR 50ML LL SCALE MARK (SYRINGE) ×1 IMPLANT
SYR BULB IRRIG 60ML STRL (SYRINGE) ×1 IMPLANT
SYR CONTROL 10ML LL (SYRINGE) ×1 IMPLANT
SYR TOOMEY 50ML (SYRINGE) IMPLANT
TOWEL GREEN STERILE FF (TOWEL DISPOSABLE) ×2 IMPLANT
TRAY DSU PREP LF (CUSTOM PROCEDURE TRAY) ×1 IMPLANT
TUBE CONNECTING 20X1/4 (TUBING) ×1 IMPLANT
TUBING INFILTRATION IT-10001 (TUBING) ×1 IMPLANT
TUBING SET GRADUATE ASPIR 12FT (MISCELLANEOUS) ×1 IMPLANT
UNDERPAD 30X36 HEAVY ABSORB (UNDERPADS AND DIAPERS) ×2 IMPLANT
YANKAUER SUCT BULB TIP NO VENT (SUCTIONS) ×1 IMPLANT

## 2024-02-10 NOTE — Discharge Instructions (Addendum)
 INSTRUCTIONS FOR AFTER BREAST SURGERY   You will likely have some questions about what to expect following your operation.  The following information will help you and your family understand what to expect when you are discharged from the hospital.  It is important to follow these guidelines to help ensure a smooth recovery and reduce complication.  Postoperative instructions include information on: diet, wound care, medications and physical activity.  AFTER SURGERY Expect to go home after the procedure.  In some cases, you may need to spend one night in the hospital for observation.  DIET Breast surgery does not require a specific diet.  However, the healthier you eat the better your body will heal. It is important to increasing your protein intake.  This means limiting the foods with sugar and carbohydrates.  Focus on vegetables and some meat.  If you have liposuction during your procedure be sure to drink water .  If your urine is bright yellow, then it is concentrated, and you need to drink more water .  As a general rule after surgery, you should have 8 ounces of water  every hour while awake.  If you find you are persistently nauseated or unable to take in liquids let us  know.  NO TOBACCO USE or EXPOSURE.  This will slow your healing process and lead to a wound.  WOUND CARE Leave the binder on for 3 days . Use fragrance free soap like Dial, Dove or [place].   After 3 days you can remove the binder to shower. Once dry apply binder or sports bra. If you have liposuction you will have a soft and spongy dressing (Lipofoam) that helps prevent creases in your skin.  Remove before you shower and then replace it.  It is also available on [employer]. If you have steri-strips / tape directly attached to your skin leave them in place. It is OK to get these wet.   No baths, pools or hot tubs for four weeks. We close your incision to leave the smallest and best-looking scar. No ointment or creams on your incisions  for four weeks.  No Neosporin (Too many skin reactions).  A few weeks after surgery you can use Mederma and start massaging the scar. We ask you to wear your binder or sports bra for the first 6 weeks around the clock, including while sleeping. This provides added comfort and helps reduce the fluid accumulation at the surgery site. NO Ice or heating pads to the operative site.  You have a very high risk of a BURN before you feel the temperature change.  ACTIVITY No heavy lifting until cleared by the doctor.  This usually means no more than a half-gallon of milk.  It is OK to walk and climb stairs. Moving your legs is very important to decrease your risk of a blood clot.  It will also help keep you from getting deconditioned.  Every 1 to 2 hours get up and walk for 5 minutes. This will help with a quicker recovery back to normal.  Let pain be your guide so you don't do too much.  This time is for you to recover.  You will be more comfortable if you sleep and rest with your head elevated either with a few pillows under you or in a recliner.  No stomach sleeping for a three months.  WORK Everyone returns to work at different times. As a rough guide, most people take at least 1 - 2 weeks off prior to returning to work. If  you need documentation for your job, give the forms to the front staff at the clinic.  DRIVING Arrange for someone to bring you home from the hospital after your surgery.  You may be able to drive a few days after surgery but not while taking any narcotics or valium.  BOWEL MOVEMENTS Constipation can occur after anesthesia and while taking pain medication.  It is important to stay ahead for your comfort.  We recommend taking Milk of Magnesia (2 tablespoons; twice a day) while taking the pain pills.  MEDICATIONS You may be prescribed should start after surgery At your preoperative visit for you history and physical you were given the following medications: Antibiotic: Start this  medication when you get home and take according to the instructions on the bottle. Zofran  4 mg:  This is to treat nausea and vomiting.  You can take this every 6 hours as needed and only if needed. Oxycodone  5 mg every 6 hours for 3 - 5 days.  This is to be used after you have taken the Motrin or the Tylenol . 4.   Gabapentin  300 mg every 12 hours for 7 days.  Over the counter Medication to take: Ibuprofen (Motrin) 400 - 600 mg every 6 hour for 7 days Tylenol  500 mg every 6 hours for 7 days.  Only take the Oxycodone  after you have tried these two. MiraLAX or stool softener of choice: Take this according to the bottle if you take the Norco.  If muscle work done:  Flexeril 5 mg every 12 hours for 7 days.  WHEN TO CALL Call your surgeon's office if any of the following occur: Fever 101 degrees F or greater Excessive bleeding or fluid from the incision site. Pain that increases over time without aid from the medications Redness, warmth, or pus draining from incision sites Persistent nausea or inability to take in liquids Severe misshapen area that underwent the operation.   Post Anesthesia Home Care Instructions  Activity: Get plenty of rest for the remainder of the day. A responsible individual must stay with you for 24 hours following the procedure.  For the next 24 hours, DO NOT: -Drive a car -Advertising copywriter -Drink alcoholic beverages -Take any medication unless instructed by your physician -Make any legal decisions or sign important papers.  Meals: Start with liquid foods such as gelatin or soup. Progress to regular foods as tolerated. Avoid greasy, spicy, heavy foods. If nausea and/or vomiting occur, drink only clear liquids until the nausea and/or vomiting subsides. Call your physician if vomiting continues.  Special Instructions/Symptoms: Your throat may feel dry or sore from the anesthesia or the breathing tube placed in your throat during surgery. If this causes  discomfort, gargle with warm salt water . The discomfort should disappear within 24 hours.  If you had a scopolamine  patch placed behind your ear for the management of post- operative nausea and/or vomiting:  1. The medication in the patch is effective for 72 hours, after which it should be removed.  Wrap patch in a tissue and discard in the trash. Wash hands thoroughly with soap and water . 2. You may remove the patch earlier than 72 hours if you experience unpleasant side effects which may include dry mouth, dizziness or visual disturbances. 3. Avoid touching the patch. Wash your hands with soap and water  after contact with the patch.  No tylenol  until after 3:15pm

## 2024-02-10 NOTE — Interval H&P Note (Signed)
 History and Physical Interval Note:  02/10/2024 9:55 AM  Veronica Dudley  has presented today for surgery, with the diagnosis of n65.1.  The various methods of treatment have been discussed with the patient and family. After consideration of risks, benefits and other options for treatment, the patient has consented to  Procedure(s) with comments: CAPSULECTOMY, BREAST (Left) - left breast contracture release LIPOSUCTION, WITH FAT TRANSFER (Left) - and fat grafting as a surgical intervention.  The patient's history has been reviewed, patient examined, no change in status, stable for surgery.  I have reviewed the patient's chart and labs.  Questions were answered to the patient's satisfaction.     Veronica Dudley

## 2024-02-10 NOTE — Op Note (Signed)
 DATE OF OPERATION: 02/10/2024  LOCATION: [facility]  PREOPERATIVE DIAGNOSIS: Breast asymmetry after breast cancer treatment  POSTOPERATIVE DIAGNOSIS: Same  PROCEDURE:  Release of left breast capsule contracture 100 cm2 Fat filling to the left breast 100 cc  SURGEON: Estefana Fritter, DO  ASSISTANT: Materials engineer, PA  EBL: none  CONDITION: Stable  COMPLICATIONS: None  INDICATION: The patient, Veronica Dudley, is a 58 y.o. female born on 1965/11/15, is here for treatment of breast asymmetry after treatment of breast cancer with a left partial mastectomy.   PROCEDURE DETAILS:  The patient was seen prior to surgery and marked.  The IV antibiotics were given. The patient was taken to the operating room and given a general anesthetic. A standard time out was performed and all information was confirmed by those in the room. SCDs were placed.   The breasts and abdomen were prepped and draped.  Local was placed in the lower abdomen for two small incisions.  The #15 blade was used to make two small incisions through which the tumescent was infused for 300 ml.  The fat harvesting was then done with the Revolve.  The manufacture guidelines were followed and the adipose collected and washed.  We were able to obtain 100 cc of adipose.   The adipose was placed in 10 cc syringes.  A small incision was made at the nipple areola juncture. The curved knife was used to place in the left breast at the superior aspect.  This was used to release the capsule of the breast that was 100 cm 2 in size.  This improved the tethering of the breast in the superior direction. The canula was placed in this area and all of the adipose infused. All incisions were closed with the 4-0 Monocryl.  Derma bond and steri strips were applied with a dressing, binder and topafoam. The patient was allowed to wake up and taken to recovery room in stable condition at the end of the case. The family was notified at the  end of the case.   The advanced practice practitioner (APP) assisted throughout the case.  The APP was essential in retraction and counter traction when needed to make the case progress smoothly.  This retraction and assistance made it possible to see the tissue plans for the procedure.  The assistance was needed for blood control, tissue re-approximation and assisted with closure of the incision site.

## 2024-02-10 NOTE — Anesthesia Postprocedure Evaluation (Signed)
 Anesthesia Post Note  Patient: Veronica Dudley  Procedure(s) Performed: CAPSULECTOMY, BREAST (Left: Breast) LIPOSUCTION, WITH FAT TRANSFER (Left: Breast)     Patient location during evaluation: PACU Anesthesia Type: General Level of consciousness: awake and alert Pain management: pain level controlled Vital Signs Assessment: post-procedure vital signs reviewed and stable Respiratory status: spontaneous breathing, nonlabored ventilation, respiratory function stable and patient connected to nasal cannula oxygen Cardiovascular status: blood pressure returned to baseline and stable Postop Assessment: no apparent nausea or vomiting Anesthetic complications: no   No notable events documented.  Last Vitals:  Vitals:   02/10/24 1315 02/10/24 1317  BP: 139/81   Pulse: 66 64  Resp: 18 20  Temp:    SpO2: 96% 95%    Last Pain:  Vitals:   02/10/24 1317  TempSrc:   PainSc: 4                  Rome Ade

## 2024-02-10 NOTE — Telephone Encounter (Signed)
 Patient says that she went to pharmacy and they didn't have her presciptions fo after surgery, please advise and reach out

## 2024-02-10 NOTE — Anesthesia Preprocedure Evaluation (Addendum)
 Anesthesia Evaluation  Patient identified by MRN, date of birth, ID band Patient awake    Reviewed: Allergy & Precautions, H&P , NPO status , Patient's Chart, lab work & pertinent test results  History of Anesthesia Complications (+) PONV and history of anesthetic complications  Airway Mallampati: II  TM Distance: >3 FB Neck ROM: Full    Dental no notable dental hx.    Pulmonary neg pulmonary ROS, former smoker   Pulmonary exam normal breath sounds clear to auscultation       Cardiovascular (-) angina (-) Past MI negative cardio ROS Normal cardiovascular exam Rhythm:Regular Rate:Normal     Neuro/Psych neg Seizures negative neurological ROS  negative psych ROS   GI/Hepatic negative GI ROS, Neg liver ROS,,,  Endo/Other  negative endocrine ROS    Renal/GU negative Renal ROS  negative genitourinary   Musculoskeletal negative musculoskeletal ROS (+)    Abdominal   Peds negative pediatric ROS (+)  Hematology  (+) Blood dyscrasia, anemia   Anesthesia Other Findings   Reproductive/Obstetrics Hx of right breast DCIS                              Anesthesia Physical Anesthesia Plan  ASA: 2  Anesthesia Plan: General   Post-op Pain Management: Tylenol  PO (pre-op)*   Induction: Intravenous  PONV Risk Score and Plan: 4 or greater and Propofol  infusion, TIVA, Midazolam , Ondansetron , Treatment may vary due to age or medical condition and Dexamethasone   Airway Management Planned: LMA  Additional Equipment: None  Intra-op Plan:   Post-operative Plan: Extubation in OR  Informed Consent: I have reviewed the patients History and Physical, chart, labs and discussed the procedure including the risks, benefits and alternatives for the proposed anesthesia with the patient or authorized representative who has indicated his/her understanding and acceptance.     Dental advisory given  Plan  Discussed with: CRNA  Anesthesia Plan Comments:          Anesthesia Quick Evaluation

## 2024-02-10 NOTE — Transfer of Care (Addendum)
 Immediate Anesthesia Transfer of Care Note  Patient: Veronica Dudley  Procedure(s) Performed: CAPSULECTOMY, BREAST (Left: Breast) LIPOSUCTION, WITH FAT TRANSFER (Left: Breast)  Patient Location: PACU  Anesthesia Type:General  Level of Consciousness: drowsy and patient cooperative  Airway & Oxygen Therapy: Patient Spontanous Breathing and Patient connected to face mask oxygen  Post-op Assessment: Report given to RN and Post -op Vital signs reviewed and stable  Post vital signs: Reviewed and stable  Last Vitals:  Vitals Value Taken Time  BP 129/84 02/10/24 12:00  Temp    Pulse 94 02/10/24 12:00  Resp 14 02/10/24 12:00  SpO2 97 % 02/10/24 12:00  Vitals shown include unfiled device data.  Last Pain:  Vitals:   02/10/24 0916  TempSrc: Tympanic  PainSc: 0-No pain      Patients Stated Pain Goal: 5 (02/10/24 0916)  Complications: No notable events documented.

## 2024-02-10 NOTE — Anesthesia Procedure Notes (Signed)
 Procedure Name: LMA Insertion Date/Time: 02/10/2024 10:40 AM  Performed by: Leotha Andrez DEL, CRNAPre-anesthesia Checklist: Patient identified, Emergency Drugs available, Suction available, Patient being monitored and Timeout performed Patient Re-evaluated:Patient Re-evaluated prior to induction Oxygen Delivery Method: Circle system utilized Preoxygenation: Pre-oxygenation with 100% oxygen Induction Type: IV induction Ventilation: Mask ventilation without difficulty LMA: LMA inserted LMA Size: 4.0 Number of attempts: 1 Placement Confirmation: breath sounds checked- equal and bilateral and positive ETCO2 Tube secured with: Tape Dental Injury: Teeth and Oropharynx as per pre-operative assessment

## 2024-02-10 NOTE — Telephone Encounter (Signed)
 err

## 2024-02-10 NOTE — Telephone Encounter (Signed)
 Rx sent.

## 2024-02-11 ENCOUNTER — Telehealth: Payer: Self-pay | Admitting: Surgical

## 2024-02-11 ENCOUNTER — Encounter (HOSPITAL_BASED_OUTPATIENT_CLINIC_OR_DEPARTMENT_OTHER): Payer: Self-pay | Admitting: Plastic Surgery

## 2024-02-11 ENCOUNTER — Encounter: Payer: Self-pay | Admitting: Surgical

## 2024-02-11 DIAGNOSIS — Z719 Counseling, unspecified: Secondary | ICD-10-CM

## 2024-02-11 NOTE — Telephone Encounter (Signed)
 Pt had surgery yesterday, postop:left breast contracture release and fat grafting/ins/sx/10-13/[facility]/   Pt is needing a Drs note for her work.  Please advise what the note needs to say, pt husband will come pick up today. Thank you

## 2024-02-11 NOTE — Telephone Encounter (Signed)
 2 weeks out of work, can return on 10/28. No lift > 15 pounds until 11/25

## 2024-02-12 NOTE — Addendum Note (Signed)
 Addendum  created 02/12/24 1140 by Erma Thom SAUNDERS, MD   Attestation recorded in Intraprocedure, Flowsheet accepted, Intraprocedure Attestations filed

## 2024-02-14 ENCOUNTER — Telehealth: Payer: Self-pay | Admitting: *Deleted

## 2024-02-14 NOTE — Telephone Encounter (Signed)
 Received call from the patient on (02/10/24) regarding medications that were to be sent in at her Pre-op.    Patient stated that she came in for her Pre-op appt, but the medications that were to be sent in to the pharmacy was not.  Informed the patient that I will reach out to Dr. Lowery and ask her to please call them in for her.  Patient verbalized understanding and agreed.  Called Dr. Lowery and informed her of the message.

## 2024-02-18 ENCOUNTER — Encounter: Admitting: Plastic Surgery

## 2024-02-21 ENCOUNTER — Encounter: Payer: Self-pay | Admitting: Surgical

## 2024-02-21 ENCOUNTER — Ambulatory Visit (INDEPENDENT_AMBULATORY_CARE_PROVIDER_SITE_OTHER): Admitting: Surgical

## 2024-02-21 VITALS — BP 138/87 | HR 86 | Temp 98.4°F | Ht 68.0 in | Wt 157.0 lb

## 2024-02-21 DIAGNOSIS — N651 Disproportion of reconstructed breast: Secondary | ICD-10-CM

## 2024-02-21 DIAGNOSIS — C50811 Malignant neoplasm of overlapping sites of right female breast: Secondary | ICD-10-CM

## 2024-02-21 NOTE — Progress Notes (Signed)
 58 year old female here for follow-up after liposuction of abdomen and fat grafting to left breast with Dr. Lowery on 02/10/2024.  She presents today for follow-up.  She reports she is overall doing well, is having some discomfort to her abdomen and left breast.  She reports a lot of soreness.  She has been drinking and eating fine, but does report some decreased appetite.  She also reports some constipation.  She is not having any infectious symptoms.  She has been up and active/ambulatory  Chaperone present on exam On exam firmness of abdomen is noted above umbilicus and along the left and right flanks.  Tenderness is noted palpation.  No overlying skin changes are noted.  Abdominal incisions are intact.  Firmness to left breast is noted at fat grafting location.  Some overlying hyperpigmentation is noted.  Left breast incision present and intact.  There is no erythema or cellulitic changes.  No obvious subcutaneous fluid collection noted.  A/P:  Discussed importance of continuing with compressive garments, avoiding strenuous activities or heavy lifting.  There is no signs of infection or concern on exam.  Recommend gentle massage to abdomen firmness.  Discussed increasing fluid intake, starting MiraLAX to help with constipation.  Encourage patient to call with any symptomatic changes or any other concerns.  Pictures were obtained of the patient and placed in the chart with the patient's or guardian's permission.

## 2024-03-03 ENCOUNTER — Encounter: Admitting: Surgical

## 2024-03-09 ENCOUNTER — Telehealth: Payer: Self-pay | Admitting: Surgical

## 2024-03-09 NOTE — Telephone Encounter (Signed)
 Called and left a VM to R/S

## 2024-03-10 ENCOUNTER — Telehealth: Payer: Self-pay | Admitting: Surgical

## 2024-03-10 NOTE — Telephone Encounter (Signed)
 called 03-10-24 called lvmail postop:left breast contracture release and fat grafting/ins/sx/10-13/[facility]/

## 2024-03-12 ENCOUNTER — Telehealth: Payer: Self-pay | Admitting: Surgical

## 2024-03-12 NOTE — Telephone Encounter (Signed)
 called 03-12-24 lvmail called 03-10-24 called lvmail postop:left breast contracture release and fat grafting/ins/sx/10-13/[facility]/

## 2024-03-16 ENCOUNTER — Ambulatory Visit (INDEPENDENT_AMBULATORY_CARE_PROVIDER_SITE_OTHER): Admitting: Student

## 2024-03-16 VITALS — BP 122/78 | HR 76

## 2024-03-16 DIAGNOSIS — Z719 Counseling, unspecified: Secondary | ICD-10-CM

## 2024-03-16 DIAGNOSIS — N651 Disproportion of reconstructed breast: Secondary | ICD-10-CM

## 2024-03-16 DIAGNOSIS — Z9889 Other specified postprocedural states: Secondary | ICD-10-CM

## 2024-03-16 NOTE — Progress Notes (Signed)
 Patient is a 58 year old female who recently underwent release of left breast capsule contracture and fat filling to the left breast with Dr. Lowery on 02/10/2024.  She is about 5 weeks postop.  She presents to the clinic today for postoperative follow-up.  Patient was most recently seen in the clinic on 02/21/2024.  At this visit, patient was overall doing well.  She was having some discomfort to her abdomen and left breast.  On exam, firmness of the abdomen was noted above the umbilicus and along the left and right flanks.  There is some tenderness noted.  There is no overlying skin changes.  There is firmness to the left breast at the fat grafting location.  There was some overlying hyperpigmentation noted.  It was discussed with the patient to continue with compressive garments and avoid strenuous activities.  It was recommended that patient gently massage the firmness to the abdomen.  Today, patient is doing well.  She states she still has some soreness to her left flank where she had a liposuction.  She otherwise denies any pain.  She reports that the area where the fat was placed is a little bit firm.  She denies any fevers or chills.  She denies any other issues or concerns.  Chaperone present on exam.  On exam, patient is sitting upright in no acute distress.  Abdomen is soft and nontender.  There is no overlying erythema, no obvious fluid collections on exam.  To the left breast, there is firmness where the fat was placed, suspect that this is a little bit of fat necrosis.  There is no overlying skin changes.  No erythema.  No obvious fluid collections.  No signs of infection.  Incisions all appear to be well-healed.  Recommended that patient gently massage the area of firmness to her left breast a few times a day.  Discussed with her if she feels like it is worsening or has any concerns she may call us .  Patient expressed understanding.  Discussed with the patient that she may start applying  scar creams to her incisions.  Recommended silicone based guard creams.   Discussed with her it is common to have pain several weeks after liposuction.Discussed with the patient that I do like her to start increasing her activities and to continue to monitor the pain on the left side.  She expressed understanding.  Discussed with the patient that as of next week, she no longer has to wear compression.  Discussed with her that as of next week, she also has no more restrictions.  Recommended that she gradually increase her activities.  She expressed understanding.  Patient to follow back up with Dr. Lowery in about 3 months.  I instructed her to call in the meantime if she has any questions or concerns about anything.  Pictures were obtained of the patient and placed in the chart with the patient's or guardian's permission.

## 2024-03-17 ENCOUNTER — Encounter: Admitting: Surgical

## 2024-03-18 ENCOUNTER — Encounter: Admitting: Surgical

## 2024-03-18 ENCOUNTER — Encounter: Admitting: Student

## 2024-03-27 ENCOUNTER — Telehealth: Payer: Self-pay | Admitting: Plastic Surgery

## 2024-03-27 NOTE — Telephone Encounter (Signed)
 called lvmail 03-27-24 to r/s  postop:left breast contracture release and fat grafting/ins/sx/10-13/[facility]/

## 2024-03-31 ENCOUNTER — Telehealth: Payer: Self-pay | Admitting: Plastic Surgery

## 2024-03-31 NOTE — Telephone Encounter (Signed)
 called 03-31-24 lvmail called lvmail 03-27-24 to r/s  postop:left breast contracture release and fat grafting/ins/sx/10-13/[facility]/

## 2024-04-06 ENCOUNTER — Encounter: Admitting: Plastic Surgery

## 2024-04-07 DIAGNOSIS — M25562 Pain in left knee: Secondary | ICD-10-CM | POA: Diagnosis not present

## 2024-04-21 DIAGNOSIS — F411 Generalized anxiety disorder: Secondary | ICD-10-CM | POA: Diagnosis not present

## 2024-04-21 DIAGNOSIS — K5900 Constipation, unspecified: Secondary | ICD-10-CM | POA: Diagnosis not present

## 2024-04-21 DIAGNOSIS — K625 Hemorrhage of anus and rectum: Secondary | ICD-10-CM | POA: Diagnosis not present

## 2024-04-28 ENCOUNTER — Encounter (INDEPENDENT_AMBULATORY_CARE_PROVIDER_SITE_OTHER): Payer: Self-pay | Admitting: *Deleted

## 2024-05-26 ENCOUNTER — Ambulatory Visit (INDEPENDENT_AMBULATORY_CARE_PROVIDER_SITE_OTHER): Admitting: Gastroenterology

## 2024-05-26 ENCOUNTER — Encounter (INDEPENDENT_AMBULATORY_CARE_PROVIDER_SITE_OTHER): Payer: Self-pay | Admitting: Gastroenterology

## 2024-05-26 ENCOUNTER — Telehealth (INDEPENDENT_AMBULATORY_CARE_PROVIDER_SITE_OTHER): Payer: Self-pay

## 2024-05-26 VITALS — BP 133/84 | HR 94 | Temp 97.1°F | Ht 68.0 in | Wt 164.7 lb

## 2024-05-26 DIAGNOSIS — K5900 Constipation, unspecified: Secondary | ICD-10-CM | POA: Insufficient documentation

## 2024-05-26 DIAGNOSIS — K625 Hemorrhage of anus and rectum: Secondary | ICD-10-CM | POA: Insufficient documentation

## 2024-05-26 MED ORDER — PEG 3350-KCL-NA BICARB-NACL 420 G PO SOLR: 4000.0000 mL | Freq: Once | ORAL | 0 refills | Status: AC

## 2024-05-26 NOTE — Patient Instructions (Signed)
-  Increase water  intake, aim for atleast 64 oz per day -Increase fruits, veggies and whole grains, kiwi and prunes are especially good for constipation -schedule colonoscopy -Start taking Miralax 1 capful every day for one week. If bowel movements do not improve, increase to 1 capful every 12 hours. If after two weeks there is no improvement, increase to 1 capful every 8 hours  Follow up 3 months  It was a pleasure to see you today. I want to create trusting relationships with patients and provide genuine, compassionate, and quality care. I truly value your feedback! please be on the lookout for a survey regarding your visit with me today. I appreciate your input about our visit and your time in completing this!    Devonte Migues L. Monzerrath Mcburney, MSN, APRN, AGNP-C Adult-Gerontology Nurse Practitioner [facility]

## 2024-05-26 NOTE — Progress Notes (Signed)
 "  Referring Provider: Marvine Rush, MD Primary Care Physician:  Marvine Rush, MD Primary GI Physician: New (Dr. Cinderella)  Chief Complaint  Patient presents with   New Patient (Initial Visit)    Patient here today due to dark stools a maroon color mixed in with her stools. She is due for a TCS. She does have a history of constipation. She is taking Miralax one capful daily,which she says has not really helped. Has developed some acid reflux since starting oregano with black seed oil recently and thinks this has contributed to it. She is not currently on a ppi,but states she has a lot of stress as of recently.    HPI:   Veronica Dudley is a 59 y.o. female with past medical history of breast cancer, endometriosis, IDA  Patient presenting today for:  Rectal bleeding constipation  Hgb 11.4 in August with MCV 87.8 Iron  100, TIBC 303 sat 33 ferritin 52  CMP unremarkable  Reports she has seen some marron colored/darker red blood mixed in with stool. None on toilet tissue. She notes this has been ongoing for the past month or so off and on. Denies abdominal pain. She endorses some constipation. She has taken miralax 1 capful which she notes has helped some, having a BM maybe every 2-3 days, drinks about 40 oz of water  per day.   She reports more acid reflux symptoms recently. She is taking some otc chewables that she is taking on occasion, she used these for about 1 week and notes symptoms have improved. She has some nausea when reflux was flaring up. Reports more stress recently. Denies any black colored stools. No changes in appetite or weight loss.   NSAID use: none  Social hx: glass of wine on occasion Fam hx: does not know family history.   Last Colonoscopy:07/2020 - Melanosis in the colon.                           - One 7 mm polyp at the splenic flexure, removed                            with a cold snare. Resected and retrieved.                           - One diminutive polyp at the  recto-sigmoid colon.                            Biopsied.                           - External hemorrhoids COLON, SPLENIC FLEXURE, POLYPECTOMY:  -  Tubular adenoma (2 of 2 fragments)  -  No high-grade dysplasia or malignancy identified   B. RECTOSIGMOID, POLYPECTOMY:  -  Hyperplastic polyp (1 of 1 fragments)  -  No high-grade dysplasia or malignancy identified   Repeat in 5 years   Filed Weights   05/26/24 1344  Weight: 164 lb 11.2 oz (74.7 kg)     Past Medical History:  Diagnosis Date   Breast CA (HCC) 09/07/2011   Breast cancer (HCC) 09/2008   DCIS   Breast cancer (HCC) 11/28/2020   Right Breast   Cancer (HCC)    lt breast ca 2010/surg-lumpectomy/rad tx   Endometriosis  H/O bilateral oophorectomy 03/19/2014   Iron  deficiency anemia    Iron  deficiency anemia 09/07/2011   PONV (postoperative nausea and vomiting)     Past Surgical History:  Procedure Laterality Date   ABDOMINAL HYSTERECTOMY  2010   BILATERAL OOPHORECTOMY Bilateral 2010   BREAST LUMPECTOMY WITH RADIOACTIVE SEED AND SENTINEL LYMPH NODE BIOPSY Right 01/26/2021   Procedure: RIGHT BREAST LUMPECTOMY WITH RADIOACTIVE SEED AND SENTINEL LYMPH NODE BIOPSY;  Surgeon: Curvin Deward MOULD, MD;  Location: [facility];  Service: General;  Laterality: Right;   CAPSULECTOMY Left 02/10/2024   Procedure: CAPSULECTOMY, BREAST;  Surgeon: Lowery Estefana RAMAN, DO;  Location: [facility];  Service: Plastics;  Laterality: Left;  left breast contracture release   CESAREAN SECTION     CHOLECYSTECTOMY  2005   lap choli   COLONOSCOPY N/A 05/12/2015   Procedure: COLONOSCOPY;  Surgeon: Claudis RAYMOND Rivet, MD;  Location: [facility];  Service: Endoscopy;  Laterality: N/A;  955 - moved to 1/12 @ 9:30   COLONOSCOPY N/A 08/04/2020   Procedure: COLONOSCOPY;  Surgeon: Rivet Claudis RAYMOND, MD;  Location: [facility];  Service: Endoscopy;  Laterality: N/A;  am   DIAGNOSTIC LAPAROSCOPY  2004   incisional mass    LAPAROSCOPY  2006   LOA   LIPOSUCTION WITH LIPOFILLING Left 05/13/2014   Procedure: LIPOSUCTION WITH LIPOFILLING FOR SYMMETRY;  Surgeon: Estefana Reichert, DO;  Location: [facility];  Service: Plastics;  Laterality: Left;  liposuction from abdomen, lipofilling to left breast   LIPOSUCTION WITH LIPOFILLING Left 02/10/2024   Procedure: LIPOSUCTION, WITH FAT TRANSFER;  Surgeon: Lowery Estefana RAMAN, DO;  Location: [facility];  Service: Plastics;  Laterality: Left;  and fat grafting   lumpectomy  2010   left lump-snbx   POLYPECTOMY  08/04/2020   Procedure: POLYPECTOMY;  Surgeon: Rivet Claudis RAYMOND, MD;  Location: [facility];  Service: Endoscopy;;    Current Outpatient Medications  Medication Sig Dispense Refill   ALPRAZolam (XANAX) 0.5 MG tablet Take 0.5 mg by mouth daily as needed for anxiety.     b complex vitamins capsule Take 1 capsule by mouth daily.     Cholecalciferol (VITAMIN D -3 PO) Take 200 mcg by mouth. (Patient taking differently: Take 200 mcg by mouth daily at 6 (six) AM.)     MAGNESIUM PO Take 500 mcg by mouth. (Patient taking differently: Take 500 mcg by mouth daily at 6 (six) AM.)     OVER THE COUNTER MEDICATION oregano with black seed oil two per day.     No current facility-administered medications for this visit.    Allergies as of 05/26/2024 - Review Complete 05/26/2024  Allergen Reaction Noted   Cortisone Diarrhea and Other (See Comments) 07/26/2020    Social History   Socioeconomic History   Marital status: Married    Spouse name: Not on file   Number of children: Not on file   Years of education: Not on file   Highest education level: Not on file  Occupational History   Not on file  Tobacco Use   Smoking status: Never   Smokeless tobacco: Never  Vaping Use   Vaping status: Never Used  Substance and Sexual Activity   Alcohol use: Not Currently    Comment: occasional   Drug use: No   Sexual activity: Yes  Other Topics Concern    Not on file  Social History Narrative   Not on file   Social Drivers of Health  Tobacco Use: Low Risk (05/26/2024)   Patient History    Smoking Tobacco Use: Never    Smokeless Tobacco Use: Never    Passive Exposure: Not on file  Financial Resource Strain: Not on file  Food Insecurity: Not on file  Transportation Needs: Low Risk (04/25/2022)   Received from [facility])   Transportation    Has a lack of transportation kept you from medical appointments, meetings, work, or from getting things needed for daily living?: Not on file  Physical Activity: Not on file  Stress: Not on file  Social Connections: Not on file  Depression (PHQ2-9): Low Risk (12/24/2023)   Depression (PHQ2-9)    PHQ-2 Score: 0  Alcohol Screen: Not on file  Housing: Not on file  Utilities: Not At Risk (04/25/2022)   Received from [facility])   Utilities    In the past 12 months has the electric, gas, oil, or water  company threatened to shut off services in your home?: Not on file  Health Literacy: Not on file    Review of systems General: negative for malaise, night sweats, fever, chills, weight loss Neck: Negative for lumps, goiter, pain and significant neck swelling Resp: Negative for cough, wheezing, dyspnea at rest CV: Negative for chest pain, leg swelling, palpitations, orthopnea GI: denies melena, nausea, vomiting, diarrhea, dysphagia, odyonophagia, early satiety or unintentional weight loss. +rectal bleeding +constipation MSK: Negative for joint pain or swelling, back pain, and muscle pain. Derm: Negative for itching or rash Psych: Denies depression, anxiety, memory loss, confusion. No homicidal or suicidal ideation.  Heme: Negative for prolonged bleeding, bruising easily, and swollen nodes. Endocrine: Negative for cold or heat intolerance, polyuria, polydipsia and goiter. Neuro: negative for tremor, gait imbalance, syncope and seizures. The remainder of the  review of systems is noncontributory.  Physical Exam: BP 133/84 (BP Location: Left Arm, Patient Position: Sitting, Cuff Size: Normal)   Pulse 94   Temp (!) 97.1 F (36.2 C) (Temporal)   Ht 5' 8 (1.727 m)   Wt 164 lb 11.2 oz (74.7 kg)   BMI 25.04 kg/m  General:   Alert and oriented. No distress noted. Pleasant and cooperative.  Head:  Normocephalic and atraumatic. Eyes:  Conjuctiva clear without scleral icterus. Mouth:  Oral mucosa pink and moist. Good dentition. No lesions. Heart: Normal rate and rhythm, s1 and s2 heart sounds present.  Lungs: Clear lung sounds in all lobes. Respirations equal and unlabored. Abdomen:  +BS, soft, non-tender and non-distended. No rebound or guarding. No HSM or masses noted. Derm: No palmar erythema or jaundice Msk:  Symmetrical without gross deformities. Normal posture. Extremities:  Without edema. Neurologic:  Alert and  oriented x4 Psych:  Alert and cooperative. Normal mood and affect.  Invalid input(s): 6 MONTHS   ASSESSMENT: Veronica Dudley is a 59 y.o. female presenting today as a new patient for rectal bleeding and constipation  Patient reports constipation and darker red blood noted in stools over the past month. Denies any associated abdominal pain, weight loss or other GI symptoms. Last TCS in 2022 as above with TA/HPP and hemorrhoids. Not taking any NSAIDs and no ACs. At this time, would recommend proceeding with earlier interval colonoscopy given rectal bleeding, as I cannot rule out bleeding polyps, AVMs, hemorrhoids, less likely malignancy. Can uptitrate miralax via instructions provided, encouraged good water  intake, high fiber diet and avoiding straining and long periods on the toilet. Indications, risks and benefits of procedure discussed in detail with patient. Patient  verbalized understanding and is in agreement to proceed with Colonoscopy.  PLAN:  -Increase water  intake, aim for atleast 64 oz per day -Increase fruits, veggies and  whole grains, kiwi and prunes are especially good for constipation -schedule colonoscopy, ASA II  -Start taking Miralax 1 capful every day for one week. If bowel movements do not improve, increase to 1 capful every 12 hours. If after two weeks there is no improvement, increase to 1 capful every 8 hours -avoid straining, limit toilet time   All questions were answered, patient verbalized understanding and is in agreement with plan as outlined above.    Follow Up: 3 months   Deiona Hooper L. Joseantonio Dittmar, MSN, APRN, AGNP-C Adult-Gerontology Nurse Practitioner [facility]  "

## 2024-05-26 NOTE — Telephone Encounter (Signed)
 Spoke with patient in the office, scheduled TCS for 06/15/2024 at 12:45pm. Rx sent to pharmacy. Instructions given to patient.

## 2024-05-26 NOTE — Telephone Encounter (Signed)
 Called number on the back of the card, PA is not required for TCS

## 2024-05-27 ENCOUNTER — Ambulatory Visit (INDEPENDENT_AMBULATORY_CARE_PROVIDER_SITE_OTHER): Admitting: Gastroenterology

## 2024-06-10 ENCOUNTER — Encounter (HOSPITAL_COMMUNITY)

## 2024-06-15 ENCOUNTER — Encounter (HOSPITAL_COMMUNITY): Admission: RE | Payer: Self-pay | Source: Home / Self Care

## 2024-06-15 ENCOUNTER — Ambulatory Visit (HOSPITAL_COMMUNITY): Admission: RE | Admit: 2024-06-15 | Admitting: Gastroenterology

## 2024-06-16 ENCOUNTER — Inpatient Hospital Stay

## 2024-06-23 ENCOUNTER — Inpatient Hospital Stay: Admitting: Oncology
